# Patient Record
Sex: Male | Born: 1950 | Race: White | Hispanic: No | Marital: Married | Smoking: Never smoker
Health system: Southern US, Community
[De-identification: ages and names within clinical notes are randomized; demographics above are authoritative.]

## PROBLEM LIST (undated history)

## (undated) DIAGNOSIS — F329 Major depressive disorder, single episode, unspecified: Secondary | ICD-10-CM

## (undated) DIAGNOSIS — I10 Essential (primary) hypertension: Secondary | ICD-10-CM

## (undated) DIAGNOSIS — E785 Hyperlipidemia, unspecified: Secondary | ICD-10-CM

## (undated) DIAGNOSIS — K219 Gastro-esophageal reflux disease without esophagitis: Secondary | ICD-10-CM

## (undated) DIAGNOSIS — R7989 Other specified abnormal findings of blood chemistry: Secondary | ICD-10-CM

## (undated) DIAGNOSIS — C801 Malignant (primary) neoplasm, unspecified: Secondary | ICD-10-CM

## (undated) DIAGNOSIS — F32A Depression, unspecified: Secondary | ICD-10-CM

## (undated) HISTORY — PX: COSMETIC SURGERY: SHX468

## (undated) HISTORY — PX: HERNIA REPAIR: SHX51

## (undated) HISTORY — DX: Hyperlipidemia, unspecified: E78.5

## (undated) HISTORY — DX: Major depressive disorder, single episode, unspecified: F32.9

## (undated) HISTORY — DX: Gastro-esophageal reflux disease without esophagitis: K21.9

## (undated) HISTORY — DX: Essential (primary) hypertension: I10

## (undated) HISTORY — DX: Malignant (primary) neoplasm, unspecified: C80.1

## (undated) HISTORY — DX: Depression, unspecified: F32.A

## (undated) HISTORY — DX: Other specified abnormal findings of blood chemistry: R79.89

---

## 2010-12-01 HISTORY — PX: SPINE SURGERY: SHX786

## 2011-10-10 ENCOUNTER — Ambulatory Visit: Payer: Self-pay | Admitting: Surgery

## 2011-10-22 ENCOUNTER — Ambulatory Visit: Payer: Self-pay | Admitting: Unknown Physician Specialty

## 2011-10-30 ENCOUNTER — Ambulatory Visit: Payer: Self-pay | Admitting: Unknown Physician Specialty

## 2012-01-30 LAB — HM COLONOSCOPY: HM Colonoscopy: NORMAL

## 2013-01-27 ENCOUNTER — Encounter: Payer: Self-pay | Admitting: Internal Medicine

## 2013-01-27 ENCOUNTER — Telehealth: Payer: Self-pay | Admitting: Internal Medicine

## 2013-01-27 ENCOUNTER — Ambulatory Visit (INDEPENDENT_AMBULATORY_CARE_PROVIDER_SITE_OTHER): Admitting: Internal Medicine

## 2013-01-27 VITALS — BP 118/80 | HR 74 | Temp 98.2°F | Ht 71.0 in | Wt 235.0 lb

## 2013-01-27 DIAGNOSIS — E785 Hyperlipidemia, unspecified: Secondary | ICD-10-CM

## 2013-01-27 DIAGNOSIS — Z8249 Family history of ischemic heart disease and other diseases of the circulatory system: Secondary | ICD-10-CM

## 2013-01-27 DIAGNOSIS — F329 Major depressive disorder, single episode, unspecified: Secondary | ICD-10-CM

## 2013-01-27 DIAGNOSIS — E291 Testicular hypofunction: Secondary | ICD-10-CM

## 2013-01-27 DIAGNOSIS — K219 Gastro-esophageal reflux disease without esophagitis: Secondary | ICD-10-CM

## 2013-01-27 DIAGNOSIS — N529 Male erectile dysfunction, unspecified: Secondary | ICD-10-CM

## 2013-01-27 DIAGNOSIS — I10 Essential (primary) hypertension: Secondary | ICD-10-CM

## 2013-01-27 DIAGNOSIS — R7989 Other specified abnormal findings of blood chemistry: Secondary | ICD-10-CM

## 2013-01-27 DIAGNOSIS — Z79899 Other long term (current) drug therapy: Secondary | ICD-10-CM

## 2013-01-27 MED ORDER — BUPROPION HCL ER (XL) 150 MG PO TB24: 150.0000 mg | ORAL_TABLET | Freq: Every day | ORAL | Status: DC

## 2013-01-27 MED ORDER — VARDENAFIL HCL 10 MG PO TBDP: 1.0000 | ORAL_TABLET | ORAL | Status: DC | PRN

## 2013-01-27 NOTE — Patient Instructions (Addendum)
You may taper your wellbutrin by taking it every other day for one week, then every 2 days for one week,  Then twice a week for one week,  Then stop

## 2013-01-27 NOTE — Progress Notes (Signed)
Patient ID: Bobby Rich, male   DOB: June 18, 1951, 62 y.o.   MRN: [id number]  Patient Active Problem List  Diagnosis  . GERD (gastroesophageal reflux disease)  . Low testosterone  . Hyperlipidemia  . Depression  . Male erectile dysfunction  . Essential hypertension, benign    Subjective:  CC:   Chief Complaint  Patient presents with  . Establish Care    HPI:   Bobby Rich is a 62 y.o. male who presents as a new patient to establish primary care with the chief complaint of  low testosterone, previously managed with Axiron but the medication is no longer covered by [employer]. He is requesting  New transdermal testosterone replacement but cannot remember the name of it.  He has not had supplementation in over 4 months. He has erectile dysfunction as a result which has been managed with Levitra. However he notes that despite using Levitra he cannot initiate an erectionfor several hours, until it has been in his system for over 6 hours. He has no prior trial of Viagra and is requesting the new dissolvable Levitra tablets.   2) hyperlipidemia. He has a strong family history of coronary artery disease in first-degree relatives. His father had an acute MI at age 72 and his brother at 51. He has undergone risk stratification with stress testing the last one was 2 years ago. He's never had a cardiac catheterization. He is requesting local cardiology followup. he exercises regularly and is asymptomatic. He has been taking atorvastatin with no adverse effects. 3) needs flu shot.  Relocated from [place] 1.5 years ago. 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      Past Medical History  Diagnosis Date  . GERD (gastroesophageal reflux disease)   . Low testosterone   . Hyperlipidemia   . Depression     Past Surgical History  Procedure Laterality Date  . Spine surgery  2012    [place] L3 4 5   . Hernia repair Bilateral     with mesh   . Cosmetic surgery      Family History  Problem Relation Age of  Onset  . Stroke Mother   . Heart disease Father 42    AMI   . Early death Father 25  . Heart disease Brother 57    AMI    History   Social History  . Marital Status: Married    Spouse Name: N/A    Number of Children: N/A  . Years of Education: N/A   Occupational History  . Not on file.   Social History Main Topics  . Smoking status: Never Smoker   . Smokeless tobacco: Never Used  . Alcohol Use: Yes  . Drug Use: No  . Sexually Active: Not on file   Other Topics Concern  . Not on file   Social History Narrative  . No narrative on file   No Known Allergies   Review of Systems:   Patient denies headache, fevers, malaise, unintentional weight loss, skin rash, eye pain, sinus congestion and sinus pain, sore throat, dysphagia,  hemoptysis , cough, dyspnea, wheezing, chest pain, palpitations, orthopnea, edema, abdominal pain, nausea, melena, diarrhea, constipation, flank pain, dysuria, hematuria, urinary  Frequency, nocturia, numbness, tingling, seizures,  Focal weakness, Loss of consciousness,  Tremor, insomnia, depression, anxiety, and suicidal ideation.     Objective:  BP 118/80  Pulse 74  Temp(Src) 98.2 F (36.8 C) (Oral)  Ht 5\' 11"  (1.803 m)  Wt 235 lb (106.595 kg)  BMI 32.79 kg/m2  SpO2  98%  General appearance: alert, cooperative and appears stated age Ears: normal TM's and external ear canals both ears Throat: lips, mucosa, and tongue normal; teeth and gums normal Neck: no adenopathy, no carotid bruit, supple, symmetrical, trachea midline and thyroid not enlarged, symmetric, no tenderness/mass/nodules Back: symmetric, no curvature. ROM normal. No CVA tenderness. Lungs: clear to auscultation bilaterally Heart: regular rate and rhythm, S1, S2 normal, no murmur, click, rub or gallop Abdomen: soft, non-tender; bowel sounds normal; no masses,  no organomegaly Pulses: 2+ and symmetric Skin: Skin color, texture, turgor normal. No rashes or lesions Lymph nodes:  Cervical, supraclavicular, and axillary nodes normal.  Assessment and Plan:  Low testosterone His insurance will cover Fortesta  GELdose pump. We will need a baseline testosterone level prior to initiating therapy.  Hyperlipidemia Strong family history of earlier in early coronary artery disease. Continue atorvastatin. Return for fasting lipids and liver enzyme testing   Male erectile dysfunction His insurance will only cover Levitra if he has previously tried and failed on Viagra. He has not had a trial recently of Viagra. I have sent a prescription for prior to his pharmacy  GERD (gastroesophageal reflux disease) Managed with daily omeprazole.  Depression Managed with daily bupropion 150 mg daily.  Essential hypertension, benign Managed with losartan HCTZ. Blood pressure is well controlled. No changes today. Return for  renal function testing.   Updated Medication List Outpatient Encounter Prescriptions as of 01/27/2013  Medication Sig Dispense Refill  . Alpha-Lipoic Acid 600 MG CAPS Take 1 capsule by mouth daily.      Marland Kitchen aspirin 81 MG tablet Take 81 mg by mouth daily.      Marland Kitchen atorvastatin (LIPITOR) 20 MG tablet Take 20 mg by mouth daily.      Marland Kitchen buPROPion (WELLBUTRIN XL) 150 MG 24 hr tablet Take 1 tablet (150 mg total) by mouth daily.  30 tablet  1  . Cholecalciferol (VITAMIN D-3) 1000 UNITS CAPS Take 1 capsule by mouth daily.      . Chromium 500 MCG TABS Take 1 tablet by mouth daily.      . Coenzyme Q10 (CO Q-10) 100 MG CAPS Take 1 capsule by mouth daily.      Marland Kitchen LACTOBACILLUS PO Take 1 capsule by mouth daily.      Marland Kitchen losartan-hydrochlorothiazide (HYZAAR) 100-12.5 MG per tablet Take 1 tablet by mouth daily.      Marland Kitchen omeprazole (PRILOSEC) 20 MG capsule Take 20 mg by mouth daily.      . Potassium 99 MG TABS Take 1 tablet by mouth daily.      . Selenium 200 MCG CAPS Take 1 capsule by mouth daily.      . Zinc 50 MG CAPS Take 1 capsule by mouth daily.      . [DISCONTINUED] buPROPion  (WELLBUTRIN) 100 MG tablet Take 150 mg by mouth daily.      . [DISCONTINUED] vardenafil (LEVITRA) 20 MG tablet Take 20 mg by mouth daily as needed for erectile dysfunction.      . [DISCONTINUED] Vardenafil HCl (STAXYN) 10 MG TBDP Take 1 tablet by mouth as needed.  10 tablet  1   No facility-administered encounter medications on file as of 01/27/2013.     Orders Placed This Encounter  Procedures  . Comprehensive metabolic panel  . Lipid panel  . TSH  . CBC with Differential  . Testosterone, free, total  . Ambulatory referral to Cardiology  . HM COLONOSCOPY    No Follow-up on file.

## 2013-01-27 NOTE — Telephone Encounter (Signed)
Please call his pharmacy and cancel the vardenafil that i prescribed this morning.

## 2013-01-27 NOTE — Telephone Encounter (Signed)
Bobby Rich needs Tdap and flu vaccines.  Can come back or get them on the day he gets his labs

## 2013-01-28 MED ORDER — SILDENAFIL CITRATE 100 MG PO TABS: 100.0000 mg | ORAL_TABLET | Freq: Every day | ORAL | Status: DC | PRN

## 2013-01-29 ENCOUNTER — Encounter: Payer: Self-pay | Admitting: Internal Medicine

## 2013-01-29 DIAGNOSIS — K219 Gastro-esophageal reflux disease without esophagitis: Secondary | ICD-10-CM | POA: Insufficient documentation

## 2013-01-29 DIAGNOSIS — E785 Hyperlipidemia, unspecified: Secondary | ICD-10-CM | POA: Insufficient documentation

## 2013-01-29 DIAGNOSIS — F324 Major depressive disorder, single episode, in partial remission: Secondary | ICD-10-CM | POA: Insufficient documentation

## 2013-01-29 DIAGNOSIS — I10 Essential (primary) hypertension: Secondary | ICD-10-CM | POA: Insufficient documentation

## 2013-01-29 DIAGNOSIS — N529 Male erectile dysfunction, unspecified: Secondary | ICD-10-CM | POA: Insufficient documentation

## 2013-01-29 DIAGNOSIS — F329 Major depressive disorder, single episode, unspecified: Secondary | ICD-10-CM | POA: Insufficient documentation

## 2013-01-29 DIAGNOSIS — E291 Testicular hypofunction: Secondary | ICD-10-CM | POA: Insufficient documentation

## 2013-01-29 NOTE — Assessment & Plan Note (Signed)
Strong family history of earlier in early coronary artery disease. Continue atorvastatin. Return for fasting lipids and liver enzyme testing

## 2013-01-29 NOTE — Assessment & Plan Note (Signed)
His insurance will only cover Levitra if he has previously tried and failed on Viagra. He has not had a trial recently of Viagra. I have sent a prescription for prior to his pharmacy

## 2013-01-29 NOTE — Assessment & Plan Note (Signed)
Managed with daily omeprazole 

## 2013-01-29 NOTE — Assessment & Plan Note (Signed)
His insurance will cover Fortesta  GELdose pump. We will need a baseline testosterone level prior to initiating therapy.   

## 2013-01-29 NOTE — Assessment & Plan Note (Signed)
Managed with daily bupropion 150 mg daily.

## 2013-01-29 NOTE — Assessment & Plan Note (Signed)
Managed with losartan HCTZ. Blood pressure is well controlled. No changes today. Return for  renal function testing.

## 2013-02-02 ENCOUNTER — Encounter: Payer: Self-pay | Admitting: Cardiovascular Disease

## 2013-02-02 ENCOUNTER — Ambulatory Visit (INDEPENDENT_AMBULATORY_CARE_PROVIDER_SITE_OTHER): Admitting: Cardiovascular Disease

## 2013-02-02 VITALS — BP 138/88 | HR 62 | Ht 71.0 in | Wt 234.0 lb

## 2013-02-02 DIAGNOSIS — E785 Hyperlipidemia, unspecified: Secondary | ICD-10-CM

## 2013-02-02 DIAGNOSIS — Z8249 Family history of ischemic heart disease and other diseases of the circulatory system: Secondary | ICD-10-CM

## 2013-02-02 DIAGNOSIS — I10 Essential (primary) hypertension: Secondary | ICD-10-CM

## 2013-02-02 NOTE — Assessment & Plan Note (Signed)
Well controlled 

## 2013-02-02 NOTE — Patient Instructions (Addendum)
Follow up with Dr. Elease Hashimoto in one year with an EKG. Make sure you have your cholesterol checked.

## 2013-02-02 NOTE — Assessment & Plan Note (Signed)
Pt has a significant family history of CAD ( MI at young age in brother and father).  I think we use an LDL goal of 70 or less.  He's exercising on a regular basis. He's already started losing some weight. He's feeling well and is able to exercise without any complications.  We'll encourage him to eat a low-fat and low-salt diet. I'll see him again in one year. We'll check fasting labs at that time and an EKG. He has fasting labs will be drawn in the next week or so.

## 2013-02-02 NOTE — Progress Notes (Signed)
Bobby Rich Date of Birth  Apr 10, 1951       [facility]    [place] [address]  [address] [place], [place]  [address]   [place], [place]  [address] [phone]     [phone]   Fax  [phone]    Fax [phone]  Problem List: 1. Hyperlipidemia 2. Spinal surgery L3-5 laminectomy 3. Hypertension  History of Present Illness:  Bobby Rich is a 62 yo with a family hx of CAD (father and brother with MI in 12s)  He has a hx of hyperlipidemia.   He works in Consulting civil engineer.  He works out at [employer] 3-5 days / week.  He has been losing weight since joining.  He is limited by back pain.  He has a hx of HTN.  His BP has been well controlled on his current medications.  He eats a good diet - tries to avoid salt and excessive fats.   Current Outpatient Prescriptions on File Prior to Visit  Medication Sig Dispense Refill  . Alpha-Lipoic Acid 600 MG CAPS Take 1 capsule by mouth daily.      Marland Kitchen aspirin 81 MG tablet Take 81 mg by mouth daily.      Marland Kitchen atorvastatin (LIPITOR) 20 MG tablet Take 20 mg by mouth daily.      Marland Kitchen buPROPion (WELLBUTRIN XL) 150 MG 24 hr tablet Take 1 tablet (150 mg total) by mouth daily.  30 tablet  1  . Cholecalciferol (VITAMIN D-3) 1000 UNITS CAPS Take 1 capsule by mouth daily.      . Chromium 500 MCG TABS Take 1 tablet by mouth daily.      . Coenzyme Q10 (CO Q-10) 100 MG CAPS Take 1 capsule by mouth daily.      Marland Kitchen LACTOBACILLUS PO Take 1 capsule by mouth daily.      Marland Kitchen losartan-hydrochlorothiazide (HYZAAR) 100-12.5 MG per tablet Take 1 tablet by mouth daily.      Marland Kitchen omeprazole (PRILOSEC) 20 MG capsule Take 20 mg by mouth daily.      . Potassium 99 MG TABS Take 1 tablet by mouth daily.      . Selenium 200 MCG CAPS Take 1 capsule by mouth daily.      . sildenafil (VIAGRA) 100 MG tablet Take 1 tablet (100 mg total) by mouth daily as needed for erectile dysfunction.  6 tablet  0  . Zinc 50 MG CAPS Take 1 capsule by mouth daily.        No current facility-administered medications on file prior to visit.    No Known Allergies  Past Medical History  Diagnosis Date  . GERD (gastroesophageal reflux disease)   . Low testosterone   . Hyperlipidemia   . Depression   . Hypertension   . Cancer     basel cell skin cancer    Past Surgical History  Procedure Laterality Date  . Spine surgery  2012    [place] L3 4 5   . Hernia repair Bilateral     with mesh   . Cosmetic surgery      History  Smoking status  . Never Smoker   Smokeless tobacco  . Never Used    History  Alcohol Use  . Yes    Comment: social    Family History  Problem Relation Age of Onset  . Stroke Mother   . Heart disease Father 66    AMI   . Early  death Father 43  . Heart disease Brother 5    AMI    Reviw of Systems:  Reviewed in the HPI.  All other systems are negative.  Physical Exam: Blood pressure 138/88, pulse 62, height 5\' 11"  (1.803 m), weight 234 lb (106.142 kg). General: Well developed, well nourished, in no acute distress.  Head: Normocephalic, atraumatic, sclera non-icteric, mucus membranes are moist,   Neck: Supple. Carotids are 2 + without bruits. No JVD   Lungs: Clear   Heart: RR, normal S1, S2, no murmurs  Abdomen: Soft, non-tender, non-distended with normal bowel sounds.  Msk:  Strength and tone are normal   Extremities: No clubbing or cyanosis. No edema.  Distal pedal pulses are 2+ and equal    Neuro: CN II - XII intact.  Alert and oriented X 3.   Psych:  Normal   ECG: February 02, 2013:  NSR at 62, no ST or T wave abn.  Assessment / Plan:

## 2013-02-10 ENCOUNTER — Ambulatory Visit (INDEPENDENT_AMBULATORY_CARE_PROVIDER_SITE_OTHER): Admitting: *Deleted

## 2013-02-10 ENCOUNTER — Other Ambulatory Visit (INDEPENDENT_AMBULATORY_CARE_PROVIDER_SITE_OTHER)

## 2013-02-10 DIAGNOSIS — Z23 Encounter for immunization: Secondary | ICD-10-CM

## 2013-02-10 DIAGNOSIS — E291 Testicular hypofunction: Secondary | ICD-10-CM

## 2013-02-10 DIAGNOSIS — E785 Hyperlipidemia, unspecified: Secondary | ICD-10-CM

## 2013-02-10 DIAGNOSIS — Z79899 Other long term (current) drug therapy: Secondary | ICD-10-CM

## 2013-02-10 LAB — TSH: TSH: 1.95 u[IU]/mL (ref 0.35–5.50)

## 2013-02-10 LAB — CBC WITH DIFFERENTIAL/PLATELET
Basophils Relative: 0.3 % (ref 0.0–3.0)
Eosinophils Relative: 1.6 % (ref 0.0–5.0)
HCT: 41.8 % (ref 39.0–52.0)
Lymphs Abs: 1.7 10*3/uL (ref 0.7–4.0)
Monocytes Relative: 4.9 % (ref 3.0–12.0)
Neutrophils Relative %: 67.5 % (ref 43.0–77.0)
Platelets: 210 10*3/uL (ref 150.0–400.0)
RBC: 4.84 Mil/uL (ref 4.22–5.81)
WBC: 6.7 10*3/uL (ref 4.5–10.5)

## 2013-02-10 LAB — COMPREHENSIVE METABOLIC PANEL
ALT: 34 U/L (ref 0–53)
AST: 24 U/L (ref 0–37)
Calcium: 9.1 mg/dL (ref 8.4–10.5)
Chloride: 103 mEq/L (ref 96–112)
Creatinine, Ser: 1.2 mg/dL (ref 0.4–1.5)
Potassium: 4.3 mEq/L (ref 3.5–5.1)
Sodium: 138 mEq/L (ref 135–145)
Total Protein: 7.3 g/dL (ref 6.0–8.3)

## 2013-02-10 LAB — LIPID PANEL: HDL: 38.3 mg/dL — ABNORMAL LOW (ref >39.00)

## 2013-02-10 LAB — LDL CHOLESTEROL, DIRECT: Direct LDL: 100 mg/dL

## 2013-02-11 ENCOUNTER — Encounter: Payer: Self-pay | Admitting: Internal Medicine

## 2013-02-11 ENCOUNTER — Other Ambulatory Visit: Payer: Self-pay | Admitting: *Deleted

## 2013-02-11 LAB — TESTOSTERONE, FREE, TOTAL, SHBG
Sex Hormone Binding: 30 nmol/L (ref 13–71)
Testosterone-% Free: 2 % (ref 1.6–2.9)
Testosterone: 210 ng/dL — ABNORMAL LOW (ref 300–890)

## 2013-02-11 MED ORDER — TESTOSTERONE 10 MG/ACT (2%) TD GEL: 40.0000 mg | Freq: Every morning | TRANSDERMAL | Status: DC

## 2013-02-11 MED ORDER — SILDENAFIL CITRATE 100 MG PO TABS: 100.0000 mg | ORAL_TABLET | Freq: Every day | ORAL | Status: DC | PRN

## 2013-02-11 NOTE — Addendum Note (Signed)
Addended by: Sherlene Shams on: 02/11/2013 01:26 PM   Modules accepted: Orders

## 2013-02-11 NOTE — Telephone Encounter (Signed)
Please destroy the 30 day rx for fortesta.  90 days rx was printed,  [facility]

## 2013-02-21 ENCOUNTER — Encounter: Payer: Self-pay | Admitting: Internal Medicine

## 2013-02-21 ENCOUNTER — Telehealth: Payer: Self-pay | Admitting: General Practice

## 2013-02-21 MED ORDER — ATORVASTATIN CALCIUM 20 MG PO TABS: 20.0000 mg | ORAL_TABLET | Freq: Every day | ORAL | Status: DC

## 2013-02-21 MED ORDER — LOSARTAN POTASSIUM-HCTZ 100-12.5 MG PO TABS: 1.0000 | ORAL_TABLET | Freq: Every day | ORAL | Status: DC

## 2013-02-21 MED ORDER — BUPROPION HCL ER (XL) 150 MG PO TB24: 150.0000 mg | ORAL_TABLET | Freq: Every day | ORAL | Status: DC

## 2013-02-21 MED ORDER — OMEPRAZOLE 20 MG PO CPDR: 20.0000 mg | DELAYED_RELEASE_CAPSULE | Freq: Every day | ORAL | Status: DC

## 2013-02-21 NOTE — Telephone Encounter (Signed)
meds filled per pt.

## 2013-02-21 NOTE — Telephone Encounter (Signed)
Need printed copy of wellbutrin rx  Faxed  To mail order.

## 2013-02-23 ENCOUNTER — Encounter: Payer: Self-pay | Admitting: Internal Medicine

## 2013-04-06 ENCOUNTER — Encounter: Payer: Self-pay | Admitting: Internal Medicine

## 2013-04-06 DIAGNOSIS — Z1239 Encounter for other screening for malignant neoplasm of breast: Secondary | ICD-10-CM

## 2013-04-13 ENCOUNTER — Encounter: Payer: Self-pay | Admitting: Internal Medicine

## 2013-04-15 NOTE — Telephone Encounter (Signed)
We have not received any thing from pharmacy on this patient no prior signed have ask patient to contact pharmacy to send prior authorization.

## 2013-04-19 ENCOUNTER — Telehealth: Payer: Self-pay | Admitting: Internal Medicine

## 2013-04-19 NOTE — Telephone Encounter (Signed)
[facility] [phone] for prior authorization on the Testosterone 10 mg/ACT (2% gel) form is being faxed over now

## 2013-04-19 NOTE — Telephone Encounter (Signed)
Pt called checking on his prior autho   Testerone replacement therapy

## 2013-04-20 NOTE — Telephone Encounter (Signed)
Received Prior authorization Criteria question, Gave form to [place], [place]'s need to sign red folder

## 2013-04-26 ENCOUNTER — Telehealth: Payer: Self-pay | Admitting: *Deleted

## 2013-04-28 NOTE — Telephone Encounter (Signed)
Patient notified of fax sent for pre- approval for testosterone.

## 2013-04-29 ENCOUNTER — Telehealth: Payer: Self-pay | Admitting: Internal Medicine

## 2013-04-29 ENCOUNTER — Ambulatory Visit: Admitting: Adult Health

## 2013-04-29 NOTE — Telephone Encounter (Signed)
Patient is currently at urgent care being seen verified by phone.

## 2013-04-29 NOTE — Telephone Encounter (Signed)
Caller Name: Veto  Phone: [phone]  Patient: Bobby Rich, Bobby Rich  Gender: Male  DOB: 01/19/1951  Age: 62 Years  PCP: Duncan Dull (Adults only)   Does the office need to follow up with this patient?: No  RN Note:  upper abdomen under rib cage, constant, pain level of 4/10. painful to touch, appt in office cancelled.   Reason For Call & Symptoms: states was told to call to speak with nurse prior to office visit at 1445, abdominal tenderness right side  Reviewed Health History In EMR: Yes  Reviewed Medications In EMR: Yes  Reviewed Allergies In EMR: Yes  Reviewed Surgeries / Procedures: Yes  Date of Onset of Symptoms: 04/27/2013  Guideline(s) Used: Abdominal Pain - Upper  Disposition Per Guideline:   Go to ED Now Reason For Disposition Reached:    Constant abdominal pain lasting > 2 hours  Advice Given:  Call Back If:  You become worse.  Patient Will Follow Care Advice:  YES

## 2013-05-02 ENCOUNTER — Encounter: Payer: Self-pay | Admitting: Adult Health

## 2013-05-02 ENCOUNTER — Ambulatory Visit (INDEPENDENT_AMBULATORY_CARE_PROVIDER_SITE_OTHER): Admitting: Adult Health

## 2013-05-02 VITALS — BP 116/70 | HR 80 | Temp 98.1°F | Resp 12 | Wt 226.0 lb

## 2013-05-02 DIAGNOSIS — R109 Unspecified abdominal pain: Secondary | ICD-10-CM | POA: Insufficient documentation

## 2013-05-02 NOTE — Progress Notes (Signed)
Subjective:    Patient ID: Bobby Rich, male    DOB: Dec 23, 1950, 62 y.o.   MRN: [id number]  HPI  Patient is a pleasant 62 year old male who presents to clinic with complaints of upper abdominal pain. He reports the pain is mainly on the right upper quadrant but it is also in the epigastric area and left upper quadrant. The pain started Wednesday. Feels like "pain after doing sit ups" or a "very bad sunburn". Painful superficial pain. Constant. The only thing out of the usual that he has done has been recently installing bamboo flooring which contained formaldehyde. He had loose bowels associated with this for a couple of days but this has subsided. Patient was seen at [facility] on Friday and had an extensive blood work up and EKG - all normal. Pain is worse today than ever. Worse with movement. He does not notice pain associated with food (lack of or after meals). He has not taken anything for his pain. Pain improved with being still. He reports having a restless night. Pain with rolling over in bed. He is mildly nauseated. Denies fever or chills. Denies blood in his urine or stool. He does not believe this is a muscular type pain. Patient has not started any new medications or supplements.   Current Outpatient Prescriptions on File Prior to Visit  Medication Sig Dispense Refill  . Alpha-Lipoic Acid 600 MG CAPS Take 1 capsule by mouth daily.      Marland Kitchen aspirin 81 MG tablet Take 81 mg by mouth daily.      Marland Kitchen atorvastatin (LIPITOR) 20 MG tablet Take 1 tablet (20 mg total) by mouth daily.  90 tablet  1  . buPROPion (WELLBUTRIN XL) 150 MG 24 hr tablet Take 1 tablet (150 mg total) by mouth daily.  90 tablet  2  . Cholecalciferol (VITAMIN D-3) 1000 UNITS CAPS Take 1 capsule by mouth daily.      . Chromium 500 MCG TABS Take 1 tablet by mouth daily.      . Coenzyme Q10 (CO Q-10) 100 MG CAPS Take 1 capsule by mouth daily.      Marland Kitchen LACTOBACILLUS PO Take 1 capsule by mouth daily.      Marland Kitchen  losartan-hydrochlorothiazide (HYZAAR) 100-12.5 MG per tablet Take 1 tablet by mouth daily.  90 tablet  1  . omeprazole (PRILOSEC) 20 MG capsule Take 1 capsule (20 mg total) by mouth daily.  90 capsule  1  . Potassium 99 MG TABS Take 1 tablet by mouth daily.      . Selenium 200 MCG CAPS Take 1 capsule by mouth daily.      . sildenafil (VIAGRA) 100 MG tablet Take 1 tablet (100 mg total) by mouth daily as needed for erectile dysfunction.  18 tablet  3  . Testosterone 10 MG/ACT (2%) GEL Place 40 mg onto the skin every morning. 4 pumps daily. Apply to both thighs  180 g  3  . Zinc 50 MG CAPS Take 1 capsule by mouth daily.       No current facility-administered medications on file prior to visit.      Review of Systems  Constitutional: Negative for fever, chills and fatigue.  Respiratory: Negative.   Cardiovascular: Negative.   Gastrointestinal: Positive for nausea and abdominal pain. Negative for vomiting, diarrhea, constipation and blood in stool.  Genitourinary: Negative for dysuria, hematuria and flank pain.  Neurological: Negative.   Psychiatric/Behavioral: Negative.     BP 116/70  Pulse 80  Temp(Src)  98.1 F (36.7 C) (Oral)  Resp 12  Wt 226 lb (102.513 kg)  BMI 31.53 kg/m2  SpO2 98%    Objective:   Physical Exam  Constitutional: He is oriented to person, place, and time.  Overweight, cooperative gentleman in no apparent distress. Patient appears uncomfortable  Cardiovascular: Normal rate and regular rhythm.   Pulmonary/Chest: Effort normal. No respiratory distress.  Abdominal: Soft. He exhibits no mass. There is tenderness. There is no guarding.  Pain with palpating the right upper quadrant, epigastric area and left upper quadrant  Neurological: He is alert and oriented to person, place, and time.  Skin: Skin is warm and dry. No rash noted. No erythema.  Psychiatric: He has a normal mood and affect. His behavior is normal. Judgment and thought content normal.           Assessment & Plan:

## 2013-05-02 NOTE — Patient Instructions (Addendum)
  I am sending you for an ultrasound of the abdomen.  Once I obtain the results I will notify you.  We'll consider CT of the abdomen if the ultrasound does not explain your reason for pain.

## 2013-05-02 NOTE — Assessment & Plan Note (Signed)
Recent workup at [facility] with labs being completely normal. Also had EKG which was normal. Send patient for complete ultrasound. Consider CT if ultrasound does not give definite reason for his pain.

## 2013-05-04 ENCOUNTER — Other Ambulatory Visit: Payer: Self-pay | Admitting: Adult Health

## 2013-05-04 ENCOUNTER — Encounter: Payer: Self-pay | Admitting: Adult Health

## 2013-05-04 DIAGNOSIS — R109 Unspecified abdominal pain: Secondary | ICD-10-CM

## 2013-05-04 NOTE — Telephone Encounter (Signed)
Patient notified of approval. 

## 2013-05-05 ENCOUNTER — Ambulatory Visit (INDEPENDENT_AMBULATORY_CARE_PROVIDER_SITE_OTHER): Admitting: Internal Medicine

## 2013-05-05 ENCOUNTER — Encounter: Payer: Self-pay | Admitting: Internal Medicine

## 2013-05-05 ENCOUNTER — Ambulatory Visit: Payer: Self-pay | Admitting: Adult Health

## 2013-05-05 VITALS — BP 110/72 | HR 79 | Temp 98.4°F | Resp 16 | Wt 226.8 lb

## 2013-05-05 DIAGNOSIS — R109 Unspecified abdominal pain: Secondary | ICD-10-CM

## 2013-05-05 DIAGNOSIS — B029 Zoster without complications: Secondary | ICD-10-CM

## 2013-05-05 MED ORDER — HYDROCODONE-ACETAMINOPHEN 10-325 MG PO TABS: 1.0000 | ORAL_TABLET | Freq: Four times a day (QID) | ORAL | Status: DC | PRN

## 2013-05-05 MED ORDER — GABAPENTIN 300 MG PO CAPS: 300.0000 mg | ORAL_CAPSULE | Freq: Three times a day (TID) | ORAL | Status: DC

## 2013-05-05 NOTE — Progress Notes (Addendum)
Patient ID: Bobby Rich, male   DOB: [birth year], 62 y.o.   MRN: [id number]  Patient Active Problem List   Diagnosis Date Noted  . Shingles 05/08/2013  . Abdominal  pain, other specified site 05/02/2013  . Male erectile dysfunction 01/29/2013  . Essential hypertension, benign 01/29/2013  . GERD (gastroesophageal reflux disease)   . Low testosterone   . Hyperlipidemia   . Depression     Subjective:  CC:   Chief Complaint  Patient presents with  . Follow-up    from visit one week ago, now has rash.    HPI:   Noel Rodier a 62 y.o. male who presents for follow up on right sided abdominal pain of uncertain etiology.  Seen by NP one week ago and labs normal.  CT abdomen and pelvis scheduled but patient developed blistering rash  On abdominal wall so he came in to confirm suspicion of shingles as source of pain and wants to hold off on the CT .   Past Medical History  Diagnosis Date  . GERD (gastroesophageal reflux disease)   . Low testosterone   . Hyperlipidemia   . Depression   . Hypertension   . Cancer     basel cell skin cancer    Past Surgical History  Procedure Laterality Date  . Spine surgery  2012    [place] L3 4 5   . Hernia repair Bilateral     with mesh   . Cosmetic surgery         The following portions of the patient's history were reviewed and updated as appropriate: Allergies, current medications, and problem list.    Review of Systems:   12 Pt  review of systems was negative except those addressed in the HPI,     History   Social History  . Marital Status: Married    Spouse Name: N/A    Number of Children: N/A  . Years of Education: N/A   Occupational History  . Not on file.   Social History Main Topics  . Smoking status: Never Smoker   . Smokeless tobacco: Never Used  . Alcohol Use: Yes     Comment: social  . Drug Use: No  . Sexually Active: Not on file   Other Topics Concern  . Not on file   Social History Narrative   . No narrative on file    Objective:  BP 110/72  Pulse 79  Temp(Src) 98.4 F (36.9 C) (Oral)  Resp 16  Wt 226 lb 12 oz (102.853 kg)  BMI 31.64 kg/m2  SpO2 97%  General appearance: alert, cooperative and appears stated age Ears: normal TM's and external ear canals both ears Throat: lips, mucosa, and tongue normal; teeth and gums normal Neck: no adenopathy, no carotid bruit, supple, symmetrical, trachea midline and thyroid not enlarged, symmetric, no tenderness/mass/nodules Back: symmetric, no curvature. ROM normal. No CVA tenderness. Lungs: clear to auscultation bilaterally Heart: regular rate and rhythm, S1, S2 normal, no murmur, click, rub or gallop Abdomen: soft, non-tender; bowel sounds normal; no masses,  no organomegaly Pulses: 2+ and symmetric Skin: right abdominal wall vesicular red rash  Lymph nodes: Cervical, supraclavicular, and axillary nodes normal.  Assessment and Plan:  Abdominal  pain, other specified site secondareto shingles. CT cancelled.   Shingles Acyclovir, gabapentin,  And pain relievers prescribed   A total of 25 minutes was spent with patient more than half of which was spent in counseling, reviewing records from other prviders and answering scores  of questions from his wife     List Outpatient Encounter Prescriptions as of 05/05/2013  Medication Sig Dispense Refill  . Alpha-Lipoic Acid 600 MG CAPS Take 1 capsule by mouth daily.      Marland Kitchen aspirin 81 MG tablet Take 81 mg by mouth daily.      Marland Kitchen atorvastatin (LIPITOR) 20 MG tablet Take 1 tablet (20 mg total) by mouth daily.  90 tablet  1  . buPROPion (WELLBUTRIN XL) 150 MG 24 hr tablet Take 1 tablet (150 mg total) by mouth daily.  90 tablet  2  . Cholecalciferol (VITAMIN D-3) 1000 UNITS CAPS Take 1 capsule by mouth daily.      . Chromium 500 MCG TABS Take 1 tablet by mouth daily.      . Coenzyme Q10 (CO Q-10) 100 MG CAPS Take 1 capsule by mouth daily.      Marland Kitchen LACTOBACILLUS PO Take 1 capsule by mouth  daily.      Marland Kitchen losartan-hydrochlorothiazide (HYZAAR) 100-12.5 MG per tablet Take 1 tablet by mouth daily.  90 tablet  1  . omeprazole (PRILOSEC) 20 MG capsule Take 1 capsule (20 mg total) by mouth daily.  90 capsule  1  . Potassium 99 MG TABS Take 1 tablet by mouth daily.      . Selenium 200 MCG CAPS Take 1 capsule by mouth daily.      . sildenafil (VIAGRA) 100 MG tablet Take 1 tablet (100 mg total) by mouth daily as needed for erectile dysfunction.  18 tablet  3  . Zinc 50 MG CAPS Take 1 capsule by mouth daily.      . [DISCONTINUED] Testosterone 10 MG/ACT (2%) GEL Place 40 mg onto the skin every morning. 4 pumps daily. Apply to both thighs  180 g  3  . gabapentin (NEURONTIN) 300 MG capsule Take 1 capsule (300 mg total) by mouth 3 (three) times daily.  90 capsule  3  . HYDROcodone-acetaminophen (NORCO) 10-325 MG per tablet Take 1 tablet by mouth every 6 (six) hours as needed for pain.  90 tablet  0  . [DISCONTINUED] HYDROcodone-acetaminophen (NORCO) 10-325 MG per tablet Take 1 tablet by mouth every 6 (six) hours as needed for pain.  90 tablet  0   No facility-administered encounter medications on file as of 05/05/2013.     No orders of the defined types were placed in this encounter.    No Follow-up on file.

## 2013-05-05 NOTE — Patient Instructions (Addendum)
You have shingles!! \  For shingles pain:  Take Gabapentin every 8 hours  continue aleve  Add vicodin if needed   dulcolax or ex lax or sennakot for narcotic induced constipation  Continue acyclovir 800 mg five times daily   continue RTPR since it seems to be helping .    Avoid other creams   Practice contact precautions with pregnant women and those who have not had chicken pox

## 2013-05-06 ENCOUNTER — Other Ambulatory Visit: Payer: Self-pay | Admitting: Adult Health

## 2013-05-06 MED ORDER — FAMCICLOVIR 500 MG PO TABS: 500.0000 mg | ORAL_TABLET | Freq: Three times a day (TID) | ORAL | Status: DC

## 2013-05-08 DIAGNOSIS — B029 Zoster without complications: Secondary | ICD-10-CM | POA: Insufficient documentation

## 2013-05-08 NOTE — Assessment & Plan Note (Signed)
secondareto shingles. CT cancelled.

## 2013-05-08 NOTE — Assessment & Plan Note (Signed)
Acyclovir, gabapentin,  And pain relievers prescribed

## 2013-05-19 ENCOUNTER — Encounter: Payer: Self-pay | Admitting: Internal Medicine

## 2013-05-23 ENCOUNTER — Encounter: Admitting: Internal Medicine

## 2013-05-26 ENCOUNTER — Ambulatory Visit (INDEPENDENT_AMBULATORY_CARE_PROVIDER_SITE_OTHER): Admitting: Internal Medicine

## 2013-05-26 ENCOUNTER — Encounter: Payer: Self-pay | Admitting: Internal Medicine

## 2013-05-26 VITALS — BP 118/78 | HR 78 | Temp 97.8°F | Resp 16 | Ht 71.0 in | Wt 231.5 lb

## 2013-05-26 DIAGNOSIS — Z0000 Encounter for general adult medical examination without abnormal findings: Secondary | ICD-10-CM

## 2013-05-26 DIAGNOSIS — I10 Essential (primary) hypertension: Secondary | ICD-10-CM

## 2013-05-26 DIAGNOSIS — M545 Low back pain, unspecified: Secondary | ICD-10-CM

## 2013-05-26 DIAGNOSIS — E785 Hyperlipidemia, unspecified: Secondary | ICD-10-CM

## 2013-05-26 DIAGNOSIS — Z125 Encounter for screening for malignant neoplasm of prostate: Secondary | ICD-10-CM

## 2013-05-26 DIAGNOSIS — R7989 Other specified abnormal findings of blood chemistry: Secondary | ICD-10-CM

## 2013-05-26 DIAGNOSIS — B0229 Other postherpetic nervous system involvement: Secondary | ICD-10-CM

## 2013-05-26 DIAGNOSIS — E291 Testicular hypofunction: Secondary | ICD-10-CM

## 2013-05-26 DIAGNOSIS — Z79899 Other long term (current) drug therapy: Secondary | ICD-10-CM

## 2013-05-26 MED ORDER — DICLOFENAC EPOLAMINE 1.3 % TD PTCH: 1.0000 | MEDICATED_PATCH | Freq: Two times a day (BID) | TRANSDERMAL | Status: DC

## 2013-05-26 NOTE — Patient Instructions (Addendum)
You had your annual exam today and your prostate exam was normal.   We will schedule your  PSA and testosterone level in 6 weeks   You are up to date on your  TDaP vaccine and can get the Shingles vaccineand repeat fasting lipids at your next OV  in December .   We will contact you with the bloodwork results  I recommend weight loss of 23 lbs (10% or current weight) and a low glycemic index diet to lower your triglycerides  PT referral in process  Trial of flector patches (or alternative diclofenac dorm of NSAID therapy if too $$$) for your back pain   You should have an annual eye exam.  Consider [facility] or the eye center next to [facility] [employer]

## 2013-05-26 NOTE — Progress Notes (Signed)
Patient ID: Bobby Rich, male   DOB: [birth year], 62 y.o.   MRN: [id number]  The patient is here for his annual male physical examination and management of other chronic and acute problems.  1) His recent episode of shingles is resolving,  Pain much improved.   No side effects from the gabapentin .  Wants to continue it at night. And once daily.   2) Back pain:  Continues to have morning lower back stiffness aggravated  By doing  yardwork. Wants PT   3) History of skin cancer.  Sees dermatology once a year.   [facility]   4) Last eye exam was over one year,  [place] in [place]   The risk factors are reflected in the social history. Home safety : The patient has smoke detectors in the home. He wears seatbelts.  There are no firearms at home. There is no violence in the home.  There is no risks for hepatitis, STDs or HIV. There is no   history of blood transfusion. There is no travel history to infectious disease endemic areas of the world.  The patient has seen their dentist in the last six month and  their eye doctor in the last year.  They do not  have excessive sun exposure. They have seen a dermatoloigist in the last year. Discussed the need for sun protection: hats, long sleeves and use of sunscreen if there is significant sun exposure.   Diet: the importance of a healthy diet is discussed. They do have a healthy diet.  The benefits of regular aerobic exercise were discussed. He exercises a minimum of 30 minutes  5 days per week. Depression screen: there are no signs or vegative symptoms of depression- irritability, change in appetite, anhedonia, sadness/tearfullness.  The following portions of the patient's history were reviewed and updated as appropriate: allergies, current medications, past family history, past medical history,  past surgical history, past social history  and problem list.  Visual acuity was not assessed per patient preference since he has regular follow  up with his ophthalmologist. Hearing and body mass index were assessed and reviewed.   During the course of the visit the patient was educated and counseled about appropriate screening and preventive services including :  nutrition counseling, colorectal cancer screening, and recommended immunizations.    Objective  BP 118/78  Pulse 78  Temp(Src) 97.8 F (36.6 C) (Oral)  Resp 16  Ht 5\' 11"  (1.803 m)  Wt 231 lb 8 oz (105.008 kg)  BMI 32.3 kg/m2  SpO2 97%  General Appearance:    Alert, cooperative, no distress, appears stated age  Head:    Normocephalic, without obvious abnormality, atraumatic  Eyes:    PERRL, conjunctiva/corneas clear, EOM's intact, fundi    benign, both eyes       Ears:    Normal TM's and external ear canals, both ears  Nose:   Nares normal, septum midline, mucosa normal, no drainage   or sinus tenderness  Throat:   Lips, mucosa, and tongue normal; teeth and gums normal  Neck:   Supple, symmetrical, trachea midline, no adenopathy;       thyroid:  No enlargement/tenderness/nodules; no carotid   bruit or JVD  Back:     Symmetric, no curvature, ROM normal, no CVA tenderness  Lungs:     Clear to auscultation bilaterally, respirations unlabored  Chest wall:    No tenderness or deformity  Heart:    Regular rate and rhythm, S1 and  S2 normal, no murmur, rub   or gallop  Abdomen:     Soft, non-tender, bowel sounds active all four quadrants,    no masses, no organomegaly  Genitalia:    Normal male without lesion, discharge or tenderness  Rectal:    Normal tone, normal prostate, no masses or tenderness;   guaiac negative stool  Extremities:   Extremities normal, atraumatic, no cyanosis or edema  Pulses:   2+ and symmetric all extremities  Skin:   Skin color, texture, turgor normal, no rashes or lesions  Lymph nodes:   Cervical, supraclavicular, and axillary nodes normal  Neurologic:   CNII-XII intact. Normal strength, sensation and reflexes      throughout     Assessment and Plan:   Routine general medical examination at a health care facility Annual male exam was done including testicular and prostate exam. PSA is pending .  Colon ca screening is up to date  HZV (herpes zoster virus) post herpetic neuralgia From initial shingles involving LUQ.  Pain resolving,  continue gabapentin bid. Patient advised to wait 6 months for zostavax   Low back pain without sciatica Nonradiating, normal DTRS and distal strength.  Trial of diclofenac gel and PT evaluation  Hyperlipidemia .I have addressed  BMI and recommended a low glycemic index diet and weight loss of 105 of body weight to manage hypertriglyceridemia.   I have also recommended that patient start exercising with a goal of 30 minutes of aerobic exercise a minimum of 5 days per week.   Essential hypertension, benign Well controlled on current regimen. Renal function stable, no changes today.   Updated Medication List Outpatient Encounter Prescriptions as of 05/26/2013  Medication Sig Dispense Refill  . Alpha-Lipoic Acid 600 MG CAPS Take 1 capsule by mouth daily.      Marland Kitchen aspirin 81 MG tablet Take 81 mg by mouth daily.      Marland Kitchen atorvastatin (LIPITOR) 20 MG tablet Take 1 tablet (20 mg total) by mouth daily.  90 tablet  1  . buPROPion (WELLBUTRIN XL) 150 MG 24 hr tablet Take 1 tablet (150 mg total) by mouth daily.  90 tablet  2  . Cholecalciferol (VITAMIN D-3) 1000 UNITS CAPS Take 1 capsule by mouth daily.      . Chromium 500 MCG TABS Take 1 tablet by mouth daily.      . Coenzyme Q10 (CO Q-10) 100 MG CAPS Take 1 capsule by mouth daily.      Marland Kitchen gabapentin (NEURONTIN) 300 MG capsule Take 1 capsule (300 mg total) by mouth 3 (three) times daily.  90 capsule  3  . LACTOBACILLUS PO Take 1 capsule by mouth daily.      Marland Kitchen losartan-hydrochlorothiazide (HYZAAR) 100-12.5 MG per tablet Take 1 tablet by mouth daily.  90 tablet  1  . omeprazole (PRILOSEC) 20 MG capsule Take 1 capsule (20 mg total) by mouth  daily.  90 capsule  1  . Potassium 99 MG TABS Take 1 tablet by mouth daily.      . Selenium 200 MCG CAPS Take 1 capsule by mouth daily.      . sildenafil (VIAGRA) 100 MG tablet Take 1 tablet (100 mg total) by mouth daily as needed for erectile dysfunction.  18 tablet  3  . Testosterone (FORTESTA) 10 MG/ACT (2%) GEL Place 40 mg onto the skin daily.      . Zinc 50 MG CAPS Take 1 capsule by mouth daily.      . [DISCONTINUED] testosterone (  ANDROGEL) 50 MG/5GM GEL Place 5 g onto the skin daily.      . diclofenac (FLECTOR) 1.3 % PTCH Place 1 patch onto the skin 2 (two) times daily.  30 patch  0  . famciclovir (FAMVIR) 500 MG tablet Take 1 tablet (500 mg total) by mouth 3 (three) times daily.  21 tablet  0  . HYDROcodone-acetaminophen (NORCO) 10-325 MG per tablet Take 1 tablet by mouth every 6 (six) hours as needed for pain.  90 tablet  0   No facility-administered encounter medications on file as of 05/26/2013.

## 2013-05-26 NOTE — Assessment & Plan Note (Addendum)
No noticeable difference with the gel  , for the past 3 weeks.  We will recheck testosterone level in 2 months and increased dose if low

## 2013-05-27 ENCOUNTER — Encounter: Payer: Self-pay | Admitting: Internal Medicine

## 2013-05-27 DIAGNOSIS — B0229 Other postherpetic nervous system involvement: Secondary | ICD-10-CM | POA: Insufficient documentation

## 2013-05-27 DIAGNOSIS — M545 Low back pain: Secondary | ICD-10-CM | POA: Insufficient documentation

## 2013-05-27 MED ORDER — DICLOFENAC SODIUM 1 % TD GEL: 2.0000 g | Freq: Four times a day (QID) | TRANSDERMAL | Status: DC

## 2013-05-27 NOTE — Assessment & Plan Note (Signed)
From initial shingles involving LUQ.  Pain resolving,  continue gabapentin bid. Patient advised to wait 6 months for zostavax

## 2013-05-27 NOTE — Assessment & Plan Note (Signed)
Well controlled on current regimen. Renal function stable, no changes today. 

## 2013-05-27 NOTE — Assessment & Plan Note (Signed)
.  I have addressed  BMI and recommended a low glycemic index diet and weight loss of 105 of body weight to manage hypertriglyceridemia.   I have also recommended that patient start exercising with a goal of 30 minutes of aerobic exercise a minimum of 5 days per week.    

## 2013-05-27 NOTE — Assessment & Plan Note (Addendum)
Annual male exam was done including testicular and prostate exam. PSA is pending .  Colon ca screening is up to date

## 2013-05-27 NOTE — Assessment & Plan Note (Signed)
Nonradiating, normal DTRS and distal strength.  Trial of diclofenac gel and PT evaluation

## 2013-06-18 ENCOUNTER — Encounter: Payer: Self-pay | Admitting: Internal Medicine

## 2013-06-20 MED ORDER — CELECOXIB 200 MG PO CAPS: 200.0000 mg | ORAL_CAPSULE | Freq: Two times a day (BID) | ORAL | Status: DC

## 2013-06-20 NOTE — Telephone Encounter (Signed)
Glad it's helping rx sent.  1 refill per office protocol. Youll need to stop the diclofenac patch and gel.   TT

## 2013-06-20 NOTE — Addendum Note (Signed)
Addended by: Sherlene Shams on: 06/20/2013 12:54 PM   Modules accepted: Orders, Medications

## 2013-07-05 ENCOUNTER — Encounter: Payer: Self-pay | Admitting: Internal Medicine

## 2013-08-08 ENCOUNTER — Other Ambulatory Visit: Payer: Self-pay | Admitting: Internal Medicine

## 2013-09-23 ENCOUNTER — Encounter: Payer: Self-pay | Admitting: Internal Medicine

## 2013-10-05 ENCOUNTER — Telehealth: Payer: Self-pay | Admitting: Internal Medicine

## 2013-10-05 NOTE — Telephone Encounter (Signed)
Pt coming Friday a.m. For blood work and flu shot.  Also asking if he can get shingles vaccine at that time.  Please advise.

## 2013-10-06 ENCOUNTER — Other Ambulatory Visit: Payer: Self-pay

## 2013-10-06 NOTE — Telephone Encounter (Signed)
As long as he has a history of chicken pox, he can have the shingles vaccine

## 2013-10-07 ENCOUNTER — Other Ambulatory Visit (INDEPENDENT_AMBULATORY_CARE_PROVIDER_SITE_OTHER)

## 2013-10-07 ENCOUNTER — Ambulatory Visit (INDEPENDENT_AMBULATORY_CARE_PROVIDER_SITE_OTHER): Admitting: *Deleted

## 2013-10-07 DIAGNOSIS — Z23 Encounter for immunization: Secondary | ICD-10-CM

## 2013-10-07 DIAGNOSIS — Z79899 Other long term (current) drug therapy: Secondary | ICD-10-CM

## 2013-10-07 DIAGNOSIS — Z125 Encounter for screening for malignant neoplasm of prostate: Secondary | ICD-10-CM

## 2013-10-07 LAB — CBC WITH DIFFERENTIAL/PLATELET
Basophils Relative: 0.4 % (ref 0.0–3.0)
Eosinophils Absolute: 0.1 10*3/uL (ref 0.0–0.7)
Lymphocytes Relative: 32.4 % (ref 12.0–46.0)
MCHC: 33.9 g/dL (ref 30.0–36.0)
MCV: 83.9 fl (ref 78.0–100.0)
Monocytes Absolute: 0.4 10*3/uL (ref 0.1–1.0)
Neutrophils Relative %: 58.8 % (ref 43.0–77.0)
Platelets: 198 10*3/uL (ref 150.0–400.0)
RBC: 5.17 Mil/uL (ref 4.22–5.81)
WBC: 5.9 10*3/uL (ref 4.5–10.5)

## 2013-10-07 LAB — COMPREHENSIVE METABOLIC PANEL
AST: 23 U/L (ref 0–37)
Albumin: 4.1 g/dL (ref 3.5–5.2)
BUN: 17 mg/dL (ref 6–23)
Calcium: 9.7 mg/dL (ref 8.4–10.5)
Chloride: 103 mEq/L (ref 96–112)
Glucose, Bld: 91 mg/dL (ref 70–99)
Potassium: 4.6 mEq/L (ref 3.5–5.1)
Sodium: 138 mEq/L (ref 135–145)
Total Protein: 6.9 g/dL (ref 6.0–8.3)

## 2013-10-07 NOTE — Telephone Encounter (Signed)
Patient stated he has had shingles and chicken pox? Patient stated he has a history of shingles please advise as to shingles vaccine? Dr. Darrick Huntsman advised since patient had shingles in June waiting a few more months.

## 2013-10-09 ENCOUNTER — Encounter: Payer: Self-pay | Admitting: Internal Medicine

## 2013-10-10 ENCOUNTER — Other Ambulatory Visit: Payer: Self-pay | Admitting: Internal Medicine

## 2013-10-10 ENCOUNTER — Encounter: Payer: Self-pay | Admitting: Internal Medicine

## 2013-10-10 DIAGNOSIS — R7989 Other specified abnormal findings of blood chemistry: Secondary | ICD-10-CM

## 2013-10-10 MED ORDER — TESTOSTERONE 10 MG/ACT (2%) TD GEL: 60.0000 mg | Freq: Every day | TRANSDERMAL | Status: DC

## 2013-10-10 NOTE — Assessment & Plan Note (Signed)
INCREASING DOSE OF FORTESTA 2% TO 60 MG DAILY for low level

## 2013-10-11 ENCOUNTER — Encounter: Payer: Self-pay | Admitting: Internal Medicine

## 2013-10-25 ENCOUNTER — Encounter: Payer: Self-pay | Admitting: Internal Medicine

## 2013-11-02 ENCOUNTER — Encounter: Payer: Self-pay | Admitting: Adult Health

## 2013-11-02 ENCOUNTER — Ambulatory Visit (INDEPENDENT_AMBULATORY_CARE_PROVIDER_SITE_OTHER): Admitting: Adult Health

## 2013-11-02 VITALS — BP 124/72 | HR 78 | Temp 97.8°F | Resp 14 | Wt 230.0 lb

## 2013-11-02 DIAGNOSIS — M79643 Pain in unspecified hand: Secondary | ICD-10-CM

## 2013-11-02 DIAGNOSIS — M25549 Pain in joints of unspecified hand: Secondary | ICD-10-CM

## 2013-11-02 LAB — CBC WITH DIFFERENTIAL/PLATELET
Basophils Absolute: 0 10*3/uL (ref 0.0–0.1)
Hemoglobin: 15.3 g/dL (ref 13.0–17.0)
Lymphocytes Relative: 29.4 % (ref 12.0–46.0)
MCV: 84.6 fl (ref 78.0–100.0)
Monocytes Relative: 6.2 % (ref 3.0–12.0)
Neutrophils Relative %: 61.4 % (ref 43.0–77.0)
Platelets: 242 10*3/uL (ref 150.0–400.0)
RDW: 13.7 % (ref 11.5–14.6)

## 2013-11-02 LAB — C-REACTIVE PROTEIN: CRP: 0.6 mg/dL (ref 0.5–20.0)

## 2013-11-02 LAB — SEDIMENTATION RATE: Sed Rate: 9 mm/hr (ref 0–22)

## 2013-11-02 NOTE — Progress Notes (Signed)
Subjective:    Patient ID: Bobby Rich, male    DOB: [birth year], 62 y.o.   MRN: [id number]  HPI  Mr. Ragsdale is a pleasant 62 y/o male who presents to clinic with c/o bilateral hand, joint pain that has been ongoing for several months. He works in [employer] and is concerned about losing the ability to use his hands. He reports strong family hx of arthritis; however, he is uncertain which type. He denies recent illness or other problems.   Current Outpatient Prescriptions on File Prior to Visit  Medication Sig Dispense Refill  . Alpha-Lipoic Acid 600 MG CAPS Take 1 capsule by mouth daily.      Marland Kitchen aspirin 81 MG tablet Take 81 mg by mouth daily.      Marland Kitchen atorvastatin (LIPITOR) 20 MG tablet TAKE 1 TABLET DAILY  90 tablet  2  . buPROPion (WELLBUTRIN XL) 150 MG 24 hr tablet Take 1 tablet (150 mg total) by mouth daily.  90 tablet  2  . celecoxib (CELEBREX) 200 MG capsule Take 1 capsule (200 mg total) by mouth 2 (two) times daily.  180 capsule  1  . Cholecalciferol (VITAMIN D-3) 1000 UNITS CAPS Take 1 capsule by mouth daily.      . Chromium 500 MCG TABS Take 1 tablet by mouth daily.      . Coenzyme Q10 (CO Q-10) 100 MG CAPS Take 1 capsule by mouth daily.      Marland Kitchen gabapentin (NEURONTIN) 300 MG capsule Take 1 capsule (300 mg total) by mouth 3 (three) times daily.  90 capsule  3  . HYDROcodone-acetaminophen (NORCO) 10-325 MG per tablet Take 1 tablet by mouth every 6 (six) hours as needed for pain.  90 tablet  0  . LACTOBACILLUS PO Take 1 capsule by mouth daily.      Marland Kitchen losartan-hydrochlorothiazide (HYZAAR) 100-12.5 MG per tablet Take 1 tablet by mouth daily.  90 tablet  1  . omeprazole (PRILOSEC) 20 MG capsule TAKE 1 CAPSULE (20 MG TOTAL) DAILY  90 capsule  2  . Potassium 99 MG TABS Take 1 tablet by mouth daily.      . Selenium 200 MCG CAPS Take 1 capsule by mouth daily.      . sildenafil (VIAGRA) 100 MG tablet Take 1 tablet (100 mg total) by mouth daily as needed for erectile dysfunction.   18 tablet  3  . Testosterone (FORTESTA) 10 MG/ACT (2%) GEL Place 60 mg onto the skin daily.  18 g  4  . Zinc 50 MG CAPS Take 1 capsule by mouth daily.      . famciclovir (FAMVIR) 500 MG tablet Take 1 tablet (500 mg total) by mouth 3 (three) times daily.  21 tablet  0   No current facility-administered medications on file prior to visit.     Review of Systems  Musculoskeletal: Positive for joint swelling.       Bilateral hand pain  Neurological: Negative for numbness.       Objective:   Physical Exam  Constitutional: He is oriented to person, place, and time.  Musculoskeletal: Normal range of motion. He exhibits edema and tenderness.  Bilateral hands with mild inflammation noted at bases of thumb. Mild inflammation also noted PIP joints. Did not notice any ulnar deviation of fingers.  Neurological: He is alert and oriented to person, place, and time.  Psychiatric: He has a normal mood and affect. His behavior is normal. Judgment and thought content normal.  Assessment & Plan:

## 2013-11-02 NOTE — Assessment & Plan Note (Addendum)
Ongoing bilateral pain for several months. Noted swelling of PIP joints. No ulnar deviation of fingers. Check cbc, crp, sed rate, Rheumatoid factor, ANA, Refer to Rheumatology

## 2013-11-02 NOTE — Progress Notes (Signed)
Pre visit review using our clinic review tool, if applicable. No additional management support is needed unless otherwise documented below in the visit note. 

## 2013-11-03 LAB — ANA: Anti Nuclear Antibody(ANA): NEGATIVE

## 2013-11-03 LAB — RHEUMATOID FACTOR: Rhuematoid fact SerPl-aCnc: <10 IU/mL (ref <=14)

## 2013-11-04 ENCOUNTER — Encounter: Payer: Self-pay | Admitting: Adult Health

## 2013-11-10 ENCOUNTER — Ambulatory Visit: Admitting: Internal Medicine

## 2013-12-20 ENCOUNTER — Encounter: Payer: Self-pay | Admitting: Rheumatology

## 2014-01-01 ENCOUNTER — Encounter: Payer: Self-pay | Admitting: Rheumatology

## 2014-01-29 ENCOUNTER — Encounter: Payer: Self-pay | Admitting: Rheumatology

## 2014-02-09 ENCOUNTER — Encounter: Payer: Self-pay | Admitting: Internal Medicine

## 2014-02-14 ENCOUNTER — Other Ambulatory Visit: Payer: Self-pay | Admitting: *Deleted

## 2014-02-14 ENCOUNTER — Encounter: Payer: Self-pay | Admitting: Adult Health

## 2014-02-14 ENCOUNTER — Ambulatory Visit (INDEPENDENT_AMBULATORY_CARE_PROVIDER_SITE_OTHER): Admitting: Adult Health

## 2014-02-14 VITALS — HR 81 | Temp 98.2°F | Wt 233.0 lb

## 2014-02-14 DIAGNOSIS — E785 Hyperlipidemia, unspecified: Secondary | ICD-10-CM | POA: Insufficient documentation

## 2014-02-14 DIAGNOSIS — Z23 Encounter for immunization: Secondary | ICD-10-CM

## 2014-02-14 DIAGNOSIS — Z79899 Other long term (current) drug therapy: Secondary | ICD-10-CM

## 2014-02-14 DIAGNOSIS — R7989 Other specified abnormal findings of blood chemistry: Secondary | ICD-10-CM

## 2014-02-14 DIAGNOSIS — E291 Testicular hypofunction: Secondary | ICD-10-CM

## 2014-02-14 DIAGNOSIS — I10 Essential (primary) hypertension: Secondary | ICD-10-CM

## 2014-02-14 LAB — HEPATIC FUNCTION PANEL
ALBUMIN: 4.3 g/dL (ref 3.5–5.2)
ALK PHOS: 84 U/L (ref 39–117)
ALT: 30 U/L (ref 0–53)
AST: 26 U/L (ref 0–37)
BILIRUBIN DIRECT: 0.1 mg/dL (ref 0.0–0.3)
Total Bilirubin: 0.8 mg/dL (ref 0.3–1.2)
Total Protein: 7 g/dL (ref 6.0–8.3)

## 2014-02-14 LAB — BASIC METABOLIC PANEL
BUN: 19 mg/dL (ref 6–23)
CO2: 26 mEq/L (ref 19–32)
CREATININE: 1.2 mg/dL (ref 0.4–1.5)
Calcium: 9.3 mg/dL (ref 8.4–10.5)
Chloride: 103 mEq/L (ref 96–112)
GFR: 68.09 mL/min (ref >60.00)
Glucose, Bld: 111 mg/dL — ABNORMAL HIGH (ref 70–99)
Potassium: 4.6 mEq/L (ref 3.5–5.1)
SODIUM: 139 meq/L (ref 135–145)

## 2014-02-14 LAB — TESTOSTERONE: Testosterone: 239.65 ng/dL — ABNORMAL LOW (ref 350.00–890.00)

## 2014-02-14 LAB — PSA: PSA: 2.36 ng/mL (ref 0.10–4.00)

## 2014-02-14 MED ORDER — LOSARTAN POTASSIUM-HCTZ 100-12.5 MG PO TABS: 1.0000 | ORAL_TABLET | Freq: Every day | ORAL | Status: DC

## 2014-02-14 MED ORDER — TESTOSTERONE 10 MG/ACT (2%) TD GEL: 60.0000 mg | Freq: Every day | TRANSDERMAL | Status: DC

## 2014-02-14 MED ORDER — ZOSTER VACCINE LIVE 19400 UNT/0.65ML ~~LOC~~ SOLR: 0.6500 mL | Freq: Once | SUBCUTANEOUS | Status: DC

## 2014-02-14 NOTE — Telephone Encounter (Signed)
Faxed Testosterone Gel to [facility]

## 2014-02-14 NOTE — Progress Notes (Signed)
Pre visit review using our clinic review tool, if applicable. No additional management support is needed unless otherwise documented below in the visit note. 

## 2014-02-14 NOTE — Progress Notes (Signed)
Patient ID: Bobby Rich, male   DOB: Nov 13, 1951, 63 y.o.   MRN: [id number]    Subjective:    Patient ID: Bobby Rich, male    DOB: 1951-08-31, 63 y.o.   MRN: [id number]  HPI  Bobby Rich is a pleasant 63 year old male who presents to clinic for medication management and refills. He has a history of hypertension, hyperlipidemia and low testosterone. Denies any problems with medication side effects. Reports taking medications exactly as prescribed. Patient is also requesting shingles vaccine   Past Medical History  Diagnosis Date  . GERD (gastroesophageal reflux disease)   . Low testosterone   . Hyperlipidemia   . Depression   . Hypertension   . Cancer     basel cell skin cancer    Current Outpatient Prescriptions on File Prior to Visit  Medication Sig Dispense Refill  . Alpha-Lipoic Acid 600 MG CAPS Take 1 capsule by mouth daily.      Marland Kitchen aspirin 81 MG tablet Take 81 mg by mouth daily.      Marland Kitchen atorvastatin (LIPITOR) 20 MG tablet TAKE 1 TABLET DAILY  90 tablet  2  . buPROPion (WELLBUTRIN XL) 150 MG 24 hr tablet Take 1 tablet (150 mg total) by mouth daily.  90 tablet  2  . celecoxib (CELEBREX) 200 MG capsule Take 1 capsule (200 mg total) by mouth 2 (two) times daily.  180 capsule  1  . Cholecalciferol (VITAMIN D-3) 1000 UNITS CAPS Take 1 capsule by mouth daily.      . Chromium 500 MCG TABS Take 1 tablet by mouth daily.      . Coenzyme Q10 (CO Q-10) 100 MG CAPS Take 1 capsule by mouth daily.      Marland Kitchen LACTOBACILLUS PO Take 1 capsule by mouth daily.      Marland Kitchen omeprazole (PRILOSEC) 20 MG capsule TAKE 1 CAPSULE (20 MG TOTAL) DAILY  90 capsule  2  . Potassium 99 MG TABS Take 1 tablet by mouth daily.      . Selenium 200 MCG CAPS Take 1 capsule by mouth daily.      . sildenafil (VIAGRA) 100 MG tablet Take 1 tablet (100 mg total) by mouth daily as needed for erectile dysfunction.  18 tablet  3  . Zinc 50 MG CAPS Take 1 capsule by mouth daily.       No current facility-administered medications  on file prior to visit.     Review of Systems  Constitutional: Negative.   HENT: Negative.   Eyes: Negative.   Respiratory: Negative.   Cardiovascular: Negative.   Gastrointestinal: Negative.   Endocrine: Negative.   Genitourinary: Negative.   Musculoskeletal: Negative.   Skin: Negative.   Allergic/Immunologic: Negative.   Neurological: Negative.   Hematological: Negative.   Psychiatric/Behavioral: Negative.        Objective:  Pulse 81  Temp(Src) 98.2 F (36.8 C) (Oral)  Wt 233 lb (105.688 kg)  SpO2 95%   Physical Exam  Constitutional: He is oriented to person, place, and time. He appears well-developed and well-nourished. No distress.  HENT:  Head: Normocephalic and atraumatic.  Eyes: Conjunctivae and EOM are normal.  Neck: Normal range of motion. Neck supple.  Cardiovascular: Normal rate, regular rhythm, normal heart sounds and intact distal pulses.  Exam reveals no gallop and no friction rub.   No murmur heard. Pulmonary/Chest: Effort normal and breath sounds normal. No respiratory distress. He has no wheezes. He has no rales.  Musculoskeletal: Normal range of motion.  Neurological:  He is alert and oriented to person, place, and time. He has normal reflexes. Coordination normal.  Skin: Skin is warm and dry.  Psychiatric: He has a normal mood and affect. His behavior is normal. Judgment and thought content normal.          Assessment & Plan:   1. Low testosterone Currently on testosterone replacement. Check levels and PSA. Refill sent  - Testosterone  2. HTN (hypertension) On losartan/HCTZ. Check labs. Continue to follow - Basic metabolic panel  3. Medication management Medication refills sent to his pharmacy. Labs ordered accordingly - PSA  4. HLD (hyperlipidemia) On lipitor. Check labs. Continue to follow. Refills provided - Hepatic function panel  5. Need for shingles vaccine Prescription sent to pharmacy for administration.

## 2014-02-15 ENCOUNTER — Telehealth: Payer: Self-pay | Admitting: Internal Medicine

## 2014-02-15 NOTE — Telephone Encounter (Signed)
Relevant patient education assigned to patient using Emmi. ° °

## 2014-02-20 NOTE — Telephone Encounter (Signed)
Mailed unread MyChart message to pt  

## 2014-03-31 ENCOUNTER — Encounter: Payer: Self-pay | Admitting: Adult Health

## 2014-03-31 ENCOUNTER — Ambulatory Visit (INDEPENDENT_AMBULATORY_CARE_PROVIDER_SITE_OTHER): Admitting: Adult Health

## 2014-03-31 ENCOUNTER — Telehealth: Payer: Self-pay | Admitting: *Deleted

## 2014-03-31 ENCOUNTER — Other Ambulatory Visit: Payer: Self-pay | Admitting: Internal Medicine

## 2014-03-31 VITALS — BP 124/80 | HR 74 | Temp 98.3°F | Wt 228.0 lb

## 2014-03-31 DIAGNOSIS — B351 Tinea unguium: Secondary | ICD-10-CM

## 2014-03-31 DIAGNOSIS — M5430 Sciatica, unspecified side: Secondary | ICD-10-CM

## 2014-03-31 DIAGNOSIS — M545 Low back pain, unspecified: Secondary | ICD-10-CM

## 2014-03-31 DIAGNOSIS — Z0279 Encounter for issue of other medical certificate: Secondary | ICD-10-CM

## 2014-03-31 DIAGNOSIS — M5440 Lumbago with sciatica, unspecified side: Principal | ICD-10-CM

## 2014-03-31 MED ORDER — TESTOSTERONE 50 MG/5GM (1%) TD GEL: 5.0000 g | Freq: Every day | TRANSDERMAL | Status: DC

## 2014-03-31 NOTE — Progress Notes (Signed)
Patient ID: Bobby Rich, male   DOB: [birth year], 63 y.o.   MRN: [id number]   Subjective:    Patient ID: Bobby Rich, male    DOB: 04-Apr-1951, 63 y.o.   MRN: [id number]  HPI  Patient is a pleasant 63 year old male who presents to clinic with 2 concerns  1. Ongoing chronic back pain. He has a history of spinal stenosis. He is s/p lumbar laminectomy at L3-4, 5 in 2012. He has been dealing with low back pain for greater than one year. He is status post physical therapy for approximately 6 weeks, chiropractor for approximately one year and symptoms have not improved. Recent flare of his symptoms approximately 2 weeks ago. Patient has pain that radiates down his right leg. He has decreased range of motion with lateral movements. He reports difficulty moving from a sitting to a standing position. He is also having pain while he sleeps. This usually occurs when he is turning in the bed. Patient reports that this is significantly interfering with his quality of life. He is a very active gentleman and not able to do what he would like to do.  2. He is concerned that he may have toenail fungus. Reports that his wife had it for numerous years. He would like to be referred to a podiatrist.    Past Medical History  Diagnosis Date  . GERD (gastroesophageal reflux disease)   . Low testosterone   . Hyperlipidemia   . Depression   . Hypertension   . Cancer     basel cell skin cancer     Past Surgical History  Procedure Laterality Date  . Spine surgery  2012    [place] L3 4 5   . Hernia repair Bilateral     with mesh   . Cosmetic surgery       Family History  Problem Relation Age of Onset  . Stroke Mother   . Heart disease Father 84    [facility]   . Early death Father 64  . Heart disease Brother 65    [facility]     History   Social History  . Marital Status: Married    Spouse Name: N/A    Number of Children: N/A  . Years of Education: N/A   Occupational History  . Not on file.    Social History Main Topics  . Smoking status: Never Smoker   . Smokeless tobacco: Never Used  . Alcohol Use: Yes     Comment: social  . Drug Use: No  . Sexual Activity: Not on file   Other Topics Concern  . Not on file   Social History Narrative  . No narrative on file     Current Outpatient Prescriptions on File Prior to Visit  Medication Sig Dispense Refill  . Alpha-Lipoic Acid 600 MG CAPS Take 1 capsule by mouth daily.      Marland Kitchen aspirin 81 MG tablet Take 81 mg by mouth daily.      Marland Kitchen atorvastatin (LIPITOR) 20 MG tablet TAKE 1 TABLET DAILY  90 tablet  2  . buPROPion (WELLBUTRIN XL) 150 MG 24 hr tablet Take 1 tablet (150 mg total) by mouth daily.  90 tablet  2  . celecoxib (CELEBREX) 200 MG capsule Take 1 capsule (200 mg total) by mouth 2 (two) times daily.  180 capsule  1  . Cholecalciferol (VITAMIN D-3) 1000 UNITS CAPS Take 1 capsule by mouth daily.      . Chromium 500 MCG TABS  Take 1 tablet by mouth daily.      . Coenzyme Q10 (CO Q-10) 100 MG CAPS Take 1 capsule by mouth daily.      Marland Kitchen LACTOBACILLUS PO Take 1 capsule by mouth daily.      Marland Kitchen losartan-hydrochlorothiazide (HYZAAR) 100-12.5 MG per tablet Take 1 tablet by mouth daily.  90 tablet  2  . omeprazole (PRILOSEC) 20 MG capsule TAKE 1 CAPSULE (20 MG TOTAL) DAILY  90 capsule  2  . Potassium 99 MG TABS Take 1 tablet by mouth daily.      . Selenium 200 MCG CAPS Take 1 capsule by mouth daily.      . sildenafil (VIAGRA) 100 MG tablet Take 1 tablet (100 mg total) by mouth daily as needed for erectile dysfunction.  18 tablet  3  . Testosterone (FORTESTA) 10 MG/ACT (2%) GEL Place 60 mg onto the skin daily.  18 g  4  . Zinc 50 MG CAPS Take 1 capsule by mouth daily.       No current facility-administered medications on file prior to visit.     Review of Systems  Constitutional: Negative.   HENT: Negative.   Eyes: Negative.   Respiratory: Negative.   Cardiovascular: Negative.   Gastrointestinal: Negative.   Endocrine:  Negative.   Genitourinary: Negative.   Musculoskeletal: Positive for back pain.       Chronic ongoing back pain with radiation down his right leg.   Skin:       Questions whether he has toe nail fungus  Allergic/Immunologic: Negative.   Neurological: Negative.   Hematological: Negative.   Psychiatric/Behavioral: Negative.        Objective:  BP 124/80  Pulse 74  Temp(Src) 98.3 F (36.8 C) (Oral)  Wt 228 lb (103.42 kg)  SpO2 95%   Physical Exam  Constitutional: He is oriented to person, place, and time. He appears well-developed and well-nourished. No distress.  HENT:  Head: Normocephalic and atraumatic.  Eyes: Conjunctivae and EOM are normal.  Neck: Neck supple.  Cardiovascular: Normal rate and regular rhythm.   Pulmonary/Chest: Effort normal. No respiratory distress.  Musculoskeletal:  Difficulty moving from sitting to standing position. Lateral movements decreased. Able to touch his toes.  Neurological: He is alert and oriented to person, place, and time. Coordination normal.  Skin: Skin is warm and dry.  Psychiatric: He has a normal mood and affect. His behavior is normal. Judgment and thought content normal.      Assessment & Plan:   1. Low back pain with radiation Pt has tried conservative treatment for his back pain for greater than 1 year. Ongoing, chronic issues interfering with his QOL. I am sending him for MRI prior to referral to neurosurgery for evaluation. - Ambulatory referral to Neurosurgery - MR Lumbar Spine Wo Contrast; Future  2. Onychomycosis Right great toenail, possibly pinky toenail with onychomycosis. Would like referral to podiatry. Evaluate if treatment such as laser may be option.  - Ambulatory referral to Podiatry

## 2014-03-31 NOTE — Telephone Encounter (Signed)
PA faxed sent to scan and to billing

## 2014-03-31 NOTE — Progress Notes (Signed)
Pre visit review using our clinic review tool, if applicable. No additional management support is needed unless otherwise documented below in the visit note. 

## 2014-03-31 NOTE — Telephone Encounter (Signed)
Approval already in chart for the testosterone.

## 2014-03-31 NOTE — Telephone Encounter (Signed)
Please read email dated 3/17, patient sent requesting a prior authorization for Testim before being referred to urologist. While here for his appointment with Raquel, he inquired about the status of this?

## 2014-03-31 NOTE — Telephone Encounter (Signed)
Patient wants androgel from historical provider have placed form in red folder please advise.

## 2014-04-03 ENCOUNTER — Encounter: Payer: Self-pay | Admitting: Internal Medicine

## 2014-04-04 MED ORDER — TESTOSTERONE 30 MG/ACT TD SOLN: 2.0000 | Freq: Every day | TRANSDERMAL | Status: DC

## 2014-04-04 NOTE — Telephone Encounter (Signed)
Mr Lawhorn now needs Axiron bc ES is out ot Androgel. prescription for axiron printed

## 2014-04-06 ENCOUNTER — Telehealth: Payer: Self-pay | Admitting: Internal Medicine

## 2014-04-06 NOTE — Telephone Encounter (Signed)
Placed new PA in red folder.

## 2014-04-06 NOTE — Telephone Encounter (Signed)
Was contacted by [facility] and they are saying that Dr. Derrel Nip needs to submit prior authorization for axiron testosterone replacement prescription before they will fill.  Ph [phone].

## 2014-04-11 ENCOUNTER — Encounter: Payer: Self-pay | Admitting: Adult Health

## 2014-04-12 ENCOUNTER — Telehealth: Payer: Self-pay

## 2014-04-12 NOTE — Telephone Encounter (Signed)
I received a phone call from [place] stating [facility] called asking for more information on this patient who had been referred to them through Raquel. Caller was unsure of all the information that was needed so she gave me the contact number. I contacted the office and was forwarded to a voicemail. I left a voicemail asking what information was needed for this patient and left a call back number with my ext.

## 2014-04-18 NOTE — Telephone Encounter (Signed)
[facility]ed [facility] and received approval for patient approval number given [id number], approved for 1 year from today.Patient aware called patient to give approval number and case ID. For future reference.

## 2014-04-19 ENCOUNTER — Ambulatory Visit: Payer: Self-pay | Admitting: Adult Health

## 2014-04-20 ENCOUNTER — Encounter: Payer: Self-pay | Admitting: Podiatrist

## 2014-04-20 ENCOUNTER — Ambulatory Visit (INDEPENDENT_AMBULATORY_CARE_PROVIDER_SITE_OTHER): Admitting: Podiatrist

## 2014-04-20 VITALS — BP 129/84 | HR 79 | Resp 16 | Ht 71.0 in | Wt 220.0 lb

## 2014-04-20 DIAGNOSIS — B351 Tinea unguium: Secondary | ICD-10-CM | POA: Diagnosis not present

## 2014-04-20 DIAGNOSIS — B079 Viral wart, unspecified: Secondary | ICD-10-CM

## 2014-04-20 MED ORDER — TAVABOROLE 5 % EX SOLN: 1.0000 [drp] | CUTANEOUS | Status: DC

## 2014-04-20 NOTE — Progress Notes (Signed)
   Subjective:    Patient ID: Bobby Rich, male    DOB: 01-07-1951, 63 y.o.   MRN: [id number]  HPI Comments: i have toenail fungus. It does not hurt. Ive had it for 1 - 2 months. i have done nothing for it.  i have a plantar wart on my left foot under the 5th toe. It does not hurt. i just noticed it a week ago. i have done nothing for it.     Review of Systems  Musculoskeletal: Positive for back pain.  All other systems reviewed and are negative.      Objective:   Physical Exam  Patient is awake, alert, and oriented x 3.  In no acute distress.  Vascular status is intact with palpable pedal pulses at 2/4 DP and PT bilateral and capillary refill time within normal limits. Neurological sensation is also intact bilaterally via Semmes Weinstein monofilament at 5/5 sites. Light touch, vibratory sensation, Achilles tendon reflex is intact. Dermatological exam reveals skin color, turger and texture as normal. No open lesions present.  Musculature intact with dorsiflexion, plantarflexion, inversion, eversion.   Small plantar wart is present sub-left fifth digit. It has multiple capillary budding present. Very superficial in nature. It measures 2 mm in diameter.  Yellowish brownish discoloration is present on the right hallux medial nail border and right fifth digit. It appears to be superficial onychomycosis and appears to have started very recently. He has tried some over-the-counter topicals with no relief.     Assessment & Plan:  Fungal nail right 1st--and fifth toenails  Recommended treatment options and alternatives for the fungus. He would like to try laser and topical therapies first. I wrote a prescription for kerydin through [facility]. He will be seen in the [place] office for laser treatments every month for 3 months. This has been set up for him already. I also applied canthacur as a verruca left foot. I will check this in the next visit as well.

## 2014-04-20 NOTE — Patient Instructions (Signed)
We will schedule your laser visits in the [place] office- I will check to be sure the wart is improving at those visits as well.

## 2014-04-23 ENCOUNTER — Other Ambulatory Visit: Payer: Self-pay | Admitting: Internal Medicine

## 2014-04-25 NOTE — Telephone Encounter (Signed)
Appt 05/30/14

## 2014-04-27 ENCOUNTER — Other Ambulatory Visit: Payer: Self-pay | Admitting: *Deleted

## 2014-04-27 ENCOUNTER — Encounter: Payer: Self-pay | Admitting: Internal Medicine

## 2014-04-28 MED ORDER — LOSARTAN POTASSIUM-HCTZ 100-12.5 MG PO TABS: 1.0000 | ORAL_TABLET | Freq: Every day | ORAL | Status: DC

## 2014-05-08 ENCOUNTER — Encounter: Payer: Self-pay | Admitting: Internal Medicine

## 2014-05-09 ENCOUNTER — Encounter: Payer: Self-pay | Admitting: Adult Health

## 2014-05-09 MED ORDER — ZOSTER VACCINE LIVE 19400 UNT/0.65ML ~~LOC~~ SOLR: 0.6500 mL | Freq: Once | SUBCUTANEOUS | Status: DC

## 2014-05-11 ENCOUNTER — Telehealth: Payer: Self-pay | Admitting: Internal Medicine

## 2014-05-11 NOTE — Telephone Encounter (Signed)
HIS lumbar MRI so orddered by Raquel showed progression of spinal stenosis,  If he is having moderate to severe pian or leg weakness, I advise referral to neurosurgery

## 2014-05-11 NOTE — Telephone Encounter (Signed)
Referral to [facility] neurosurgery already underway. They are waiting for MRI results to be faxed to them. Faxed MRI to [phone]

## 2014-05-19 ENCOUNTER — Encounter: Payer: Self-pay | Admitting: Podiatrist

## 2014-05-19 ENCOUNTER — Ambulatory Visit: Payer: PRIVATE HEALTH INSURANCE | Admitting: Podiatrist

## 2014-05-19 VITALS — BP 119/68 | HR 90 | Resp 12

## 2014-05-19 DIAGNOSIS — B351 Tinea unguium: Secondary | ICD-10-CM

## 2014-05-19 NOTE — Patient Instructions (Signed)

## 2014-05-19 NOTE — Progress Notes (Signed)
Subjective: Patient presents today for lasering of right hallux nail and fifth digital nail. He has also been applying kerydin to the nails as well. He has a verruca plantar aspect of the left foot just below the fifth digit which appears improved at today's visit. Slight glossy appearance is still present.  Objective: Neurovascular status unchanged right first and fifth toenails have a superficial onychomycosis appearance present. All the appearance of verruca left foot is also noted.  Assessment: Mycotic toenails 1 and 5 right  Laserering of Toenails right digital nails 1 and 5 carried out at today's visit via Q-switch YAG laser by QClear Laser at continuous Patient and staff were wearing appropriate laser protective goggles/eyewear Laser device was tested prior to use and safety protocols were followed Frequency of 5 Hz, Level 4, Joules 7.5 delivered.     The patient tolerated the lasering well and will call if any concerns arise. I will see him back in 3 months for lasering #2. I also applied Care to the lesion submetatarsal 5 left

## 2014-05-30 ENCOUNTER — Encounter: Admitting: Internal Medicine

## 2014-06-23 ENCOUNTER — Ambulatory Visit: Admitting: Podiatrist

## 2014-06-23 ENCOUNTER — Ambulatory Visit (INDEPENDENT_AMBULATORY_CARE_PROVIDER_SITE_OTHER): Admitting: Podiatrist

## 2014-06-23 ENCOUNTER — Encounter: Payer: Self-pay | Admitting: Podiatrist

## 2014-06-23 VITALS — BP 132/68 | HR 64 | Resp 17

## 2014-06-23 DIAGNOSIS — B351 Tinea unguium: Secondary | ICD-10-CM

## 2014-06-23 NOTE — Progress Notes (Signed)
Patient presents for follow up of fungus on the hallux and 5th toe Right foot-  Today is his laser treatment #2- he also has a small wart on the left foot under the 5th toe we are treating with canthacur  Laserering of Toenails right carried out at today's visit via Q-switch YAG laser by QClear Laser at continuous Patient and staff were wearing appropriate laser protective goggles/eyewear Laser device was tested prior to use and safety protocols were followed Frequency of 5 Hz, Level 4, Joules 7.5 delivered.        The patient tolerated the lasering well and will call if any concerns arise.  He will be seen back in 1 months for laser 3.   The verucca was treated with canthacur today and he was given instructions for aftercare.  I will see him back for laser retreat and will check on the verucca at that visit as well

## 2014-06-26 ENCOUNTER — Ambulatory Visit (INDEPENDENT_AMBULATORY_CARE_PROVIDER_SITE_OTHER): Admitting: Internal Medicine

## 2014-06-26 ENCOUNTER — Encounter: Payer: Self-pay | Admitting: Internal Medicine

## 2014-06-26 VITALS — BP 136/78 | HR 79 | Temp 98.6°F | Resp 18 | Ht 71.0 in | Wt 231.5 lb

## 2014-06-26 DIAGNOSIS — Z0000 Encounter for general adult medical examination without abnormal findings: Secondary | ICD-10-CM | POA: Diagnosis not present

## 2014-06-26 DIAGNOSIS — E785 Hyperlipidemia, unspecified: Secondary | ICD-10-CM

## 2014-06-26 DIAGNOSIS — Z1159 Encounter for screening for other viral diseases: Secondary | ICD-10-CM

## 2014-06-26 DIAGNOSIS — R7989 Other specified abnormal findings of blood chemistry: Secondary | ICD-10-CM

## 2014-06-26 DIAGNOSIS — E291 Testicular hypofunction: Secondary | ICD-10-CM

## 2014-06-26 DIAGNOSIS — Z79899 Other long term (current) drug therapy: Secondary | ICD-10-CM

## 2014-06-26 DIAGNOSIS — N529 Male erectile dysfunction, unspecified: Secondary | ICD-10-CM

## 2014-06-26 DIAGNOSIS — I10 Essential (primary) hypertension: Secondary | ICD-10-CM

## 2014-06-26 NOTE — Progress Notes (Signed)
Pre-visit discussion using our clinic review tool. No additional management support is needed unless otherwise documented below in the visit note.  

## 2014-06-26 NOTE — Progress Notes (Signed)
Patient ID: Bobby Rich, male   DOB: 01-03-1951, 63 y.o.   MRN: [id number]   The patient is here for his annual male physical examination and management of other chronic and acute problems.   The risk factors are reflected in the social history.  The roster of all physicians providing medical care to patient - is listed in the Snapshot section of the chart.  Activities of daily living:  The patient is 100% independent in all ADLs: dressing, toileting, feeding as well as independent mobility  Home safety : The patient has smoke detectors in the home. He wears seatbelts.  There are no firearms at home. There is no violence in the home.   There is no risks for hepatitis, STDs or HIV. There is no   history of blood transfusion. There is no travel history to infectious disease endemic areas of the world.  The patient has seen their dentist in the last six month and  their eye doctor in the last year.  They do not  have excessive sun exposure. They have seen a dermatoloigist in the last year. Discussed the need for sun protection: hats, long sleeves and use of sunscreen if there is significant sun exposure.   Diet: the importance of a healthy diet is discussed. They do have a healthy diet.  The benefits of regular aerobic exercise were discussed. He exercises a minimum of 30 minutes  5 days per week. Depression screen: there are no signs or vegative symptoms of depression- irritability, change in appetite, anhedonia, sadness/tearfullness.  The following portions of the patient's history were reviewed and updated as appropriate: allergies, current medications, past family history, past medical history,  past surgical history, past social history  and problem list.  Visual acuity was not assessed per patient preference since he has regular follow up with his ophthalmologist. Hearing and body mass index were assessed and reviewed.   During the course of the visit the patient was educated and  counseled about appropriate screening and preventive services including :  nutrition counseling, colorectal cancer screening, and recommended immunizations.    Objective:  BP 136/78  Pulse 79  Temp(Src) 98.6 F (37 C) (Oral)  Resp 18  Ht 5\' 11"  (1.803 m)  Wt 231 lb 8 oz (105.008 kg)  BMI 32.30 kg/m2  SpO2 95%  General Appearance:    Alert, cooperative, no distress, appears stated age  Head:    Normocephalic, without obvious abnormality, atraumatic  Eyes:    PERRL, conjunctiva/corneas clear, EOM's intact, fundi    benign, both eyes       Ears:    Normal TM's and external ear canals, both ears  Nose:   Nares normal, septum midline, mucosa normal, no drainage   or sinus tenderness  Throat:   Lips, mucosa, and tongue normal; teeth and gums normal  Neck:   Supple, symmetrical, trachea midline, no adenopathy;       thyroid:  No enlargement/tenderness/nodules; no carotid   bruit or JVD  Back:     Symmetric, no curvature, ROM normal, no CVA tenderness  Lungs:     Clear to auscultation bilaterally, respirations unlabored  Chest wall:    No tenderness or deformity  Heart:    Regular rate and rhythm, S1 and S2 normal, no murmur, rub   or gallop  Abdomen:     Soft, non-tender, bowel sounds active all four quadrants,    no masses, no organomegaly  Genitalia:    Normal male without lesion,  discharge or tenderness  Rectal:    Normal tone, normal prostate, no masses or tenderness;   guaiac negative stool  Extremities:   Extremities normal, atraumatic, no cyanosis or edema  Pulses:   2+ and symmetric all extremities  Skin:   Skin color, texture, turgor normal, no rashes or lesions  Lymph nodes:   Cervical, supraclavicular, and axillary nodes normal  Neurologic:   CNII-XII intact. Normal strength, sensation and reflexes      throughout   Assessment and Plan:  Essential hypertension, benign Well controlled on current regimen. Renal function has been stable, no changes today. Lab Results   Component Value Date   CREATININE 1.2 02/14/2014     Low testosterone His insurance will cover Fortesta  GELdose pump. We will need a baseline testosterone level prior to initiating therapy.    Male erectile dysfunction managed with testosterone and viagra  Routine general medical examination at a health care facility Annual male exam was done including testicular and prostate exam. PSA was normal march 2015. Colon ca screening was reviewed and options given.    Lab Results  Component Value Date   PSA 2.36 02/14/2014   PSA 2.46 10/07/2013     HLD (hyperlipidemia) .I have addressed  BMI and recommended a low glycemic index diet and weight loss of 105 of body weight to manage hypertriglyceridemia.   I have also recommended that patient start exercising with a goal of 30 minutes of aerobic exercise a minimum of 5 days per week.      Updated Medication List Outpatient Encounter Prescriptions as of 06/26/2014  Medication Sig  . aspirin 81 MG tablet Take 162 mg by mouth daily.   Marland Kitchen atorvastatin (LIPITOR) 20 MG tablet TAKE 1 TABLET DAILY  . buPROPion (WELLBUTRIN XL) 150 MG 24 hr tablet TAKE 1 TABLET DAILY  . celecoxib (CELEBREX) 200 MG capsule Take 1 capsule (200 mg total) by mouth 2 (two) times daily.  . Coenzyme Q10 (CO Q-10) 100 MG CAPS Take 1 capsule by mouth daily.  Marland Kitchen LACTOBACILLUS PO Take 1 capsule by mouth daily.  Marland Kitchen losartan-hydrochlorothiazide (HYZAAR) 100-12.5 MG per tablet Take 1 tablet by mouth daily.  Marland Kitchen omeprazole (PRILOSEC) 20 MG capsule TAKE 1 CAPSULE (20 MG TOTAL) DAILY  . Selenium 200 MCG CAPS Take 1 capsule by mouth daily.  . sildenafil (VIAGRA) 100 MG tablet Take 1 tablet (100 mg total) by mouth daily as needed for erectile dysfunction.  . Testosterone 30 MG/ACT SOLN Place 2 Act onto the skin daily. Under the arm  . Zinc 50 MG CAPS Take 1 capsule by mouth daily.  . [DISCONTINUED] zoster vaccine live, PF, (ZOSTAVAX) 77824 UNT/0.65ML injection Inject 19,400 Units  into the skin once.  . [DISCONTINUED] Alpha-Lipoic Acid 600 MG CAPS Take 1 capsule by mouth daily.  . [DISCONTINUED] Cholecalciferol (VITAMIN D-3) 1000 UNITS CAPS Take 1 capsule by mouth daily.  . [DISCONTINUED] Chromium 500 MCG TABS Take 1 tablet by mouth daily.  . [DISCONTINUED] Potassium 99 MG TABS Take 1 tablet by mouth daily.  . [DISCONTINUED] Tavaborole (KERYDIN) 5 % SOLN Apply 1 drop topically 1 day or 1 dose. Apply 1 drop to the toenail daily.

## 2014-06-26 NOTE — Patient Instructions (Signed)
You had your annual  wellness exam today  Please make an appt with the front desk for a fasting lab visit ; We will contact you with the bloodwork results

## 2014-06-27 ENCOUNTER — Encounter: Payer: Self-pay | Admitting: Internal Medicine

## 2014-06-27 NOTE — Assessment & Plan Note (Signed)
Well controlled on current regimen. Renal function has been stable, no changes today. Lab Results  Component Value Date   CREATININE 1.2 02/14/2014

## 2014-06-27 NOTE — Assessment & Plan Note (Signed)
Annual male exam was done including testicular and prostate exam. PSA was normal march 2015. Colon ca screening was reviewed and options given.    Lab Results  Component Value Date   PSA 2.36 02/14/2014   PSA 2.46 10/07/2013

## 2014-06-27 NOTE — Assessment & Plan Note (Signed)
.  I have addressed  BMI and recommended a low glycemic index diet and weight loss of 105 of body weight to manage hypertriglyceridemia.   I have also recommended that patient start exercising with a goal of 30 minutes of aerobic exercise a minimum of 5 days per week.

## 2014-06-27 NOTE — Assessment & Plan Note (Signed)
managed with testosterone and viagra

## 2014-06-27 NOTE — Assessment & Plan Note (Signed)
His insurance will cover Fortesta  GELdose pump. We will need a baseline testosterone level prior to initiating therapy.

## 2014-06-28 ENCOUNTER — Telehealth: Payer: Self-pay | Admitting: Internal Medicine

## 2014-06-28 DIAGNOSIS — B0229 Other postherpetic nervous system involvement: Secondary | ICD-10-CM

## 2014-06-28 MED ORDER — GABAPENTIN 300 MG PO CAPS: 300.0000 mg | ORAL_CAPSULE | Freq: Three times a day (TID) | ORAL | Status: DC

## 2014-06-28 NOTE — Telephone Encounter (Signed)
Patient notified

## 2014-06-28 NOTE — Assessment & Plan Note (Signed)
Trial of neurontin

## 2014-06-28 NOTE — Telephone Encounter (Signed)
Patient wife called for patient requesting script for Neurotin 300 mg wanting [facility] supply [facility] and [facility] please advise.

## 2014-06-28 NOTE — Telephone Encounter (Signed)
Bobby Rich is not taking neurontin . 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 He may not need it long term.. [facility]

## 2014-07-03 ENCOUNTER — Other Ambulatory Visit (INDEPENDENT_AMBULATORY_CARE_PROVIDER_SITE_OTHER)

## 2014-07-03 DIAGNOSIS — E785 Hyperlipidemia, unspecified: Secondary | ICD-10-CM | POA: Diagnosis not present

## 2014-07-03 DIAGNOSIS — Z79899 Other long term (current) drug therapy: Secondary | ICD-10-CM | POA: Diagnosis not present

## 2014-07-03 DIAGNOSIS — R7989 Other specified abnormal findings of blood chemistry: Secondary | ICD-10-CM

## 2014-07-03 DIAGNOSIS — E291 Testicular hypofunction: Secondary | ICD-10-CM

## 2014-07-03 DIAGNOSIS — Z1159 Encounter for screening for other viral diseases: Secondary | ICD-10-CM

## 2014-07-03 LAB — LDL CHOLESTEROL, DIRECT: LDL DIRECT: 138.6 mg/dL

## 2014-07-03 LAB — CBC WITH DIFFERENTIAL/PLATELET
Basophils Absolute: 0 10*3/uL (ref 0.0–0.1)
Basophils Relative: 0.5 % (ref 0.0–3.0)
EOS PCT: 2.6 % (ref 0.0–5.0)
Eosinophils Absolute: 0.2 10*3/uL (ref 0.0–0.7)
HEMATOCRIT: 44.8 % (ref 39.0–52.0)
Hemoglobin: 14.7 g/dL (ref 13.0–17.0)
LYMPHS ABS: 2.2 10*3/uL (ref 0.7–4.0)
Lymphocytes Relative: 32.6 % (ref 12.0–46.0)
MCHC: 32.8 g/dL (ref 30.0–36.0)
MCV: 86.5 fl (ref 78.0–100.0)
MONO ABS: 0.5 10*3/uL (ref 0.1–1.0)
Monocytes Relative: 7.1 % (ref 3.0–12.0)
NEUTROS PCT: 57.2 % (ref 43.0–77.0)
Neutro Abs: 3.9 10*3/uL (ref 1.4–7.7)
Platelets: 223 10*3/uL (ref 150.0–400.0)
RBC: 5.18 Mil/uL (ref 4.22–5.81)
RDW: 14.5 % (ref 11.5–15.5)
WBC: 6.9 10*3/uL (ref 4.0–10.5)

## 2014-07-03 LAB — COMPREHENSIVE METABOLIC PANEL
ALBUMIN: 4 g/dL (ref 3.5–5.2)
ALK PHOS: 70 U/L (ref 39–117)
ALT: 21 U/L (ref 0–53)
AST: 25 U/L (ref 0–37)
BUN: 20 mg/dL (ref 6–23)
CO2: 25 mEq/L (ref 19–32)
Calcium: 9 mg/dL (ref 8.4–10.5)
Chloride: 102 mEq/L (ref 96–112)
Creatinine, Ser: 1.3 mg/dL (ref 0.4–1.5)
GFR: 59.62 mL/min — AB (ref >60.00)
GLUCOSE: 106 mg/dL — AB (ref 70–99)
Potassium: 4 mEq/L (ref 3.5–5.1)
Sodium: 136 mEq/L (ref 135–145)
Total Bilirubin: 0.5 mg/dL (ref 0.2–1.2)
Total Protein: 7.2 g/dL (ref 6.0–8.3)

## 2014-07-03 LAB — TSH: TSH: 2.25 u[IU]/mL (ref 0.35–4.50)

## 2014-07-03 LAB — LIPID PANEL
CHOLESTEROL: 204 mg/dL — AB (ref 0–200)
HDL: 42.7 mg/dL (ref >39.00)
NonHDL: 161.3
Total CHOL/HDL Ratio: 5
Triglycerides: 228 mg/dL — ABNORMAL HIGH (ref 0.0–149.0)
VLDL: 45.6 mg/dL — ABNORMAL HIGH (ref 0.0–40.0)

## 2014-07-04 LAB — HEPATITIS C ANTIBODY: HCV AB: NEGATIVE

## 2014-07-04 LAB — TESTOSTERONE, FREE, TOTAL, SHBG
SEX HORMONE BINDING: 23 nmol/L (ref 13–71)
TESTOSTERONE FREE: 125.9 pg/mL (ref 47.0–244.0)
TESTOSTERONE-% FREE: 2.5 % (ref 1.6–2.9)
Testosterone: 497 ng/dL (ref 300–890)

## 2014-07-05 ENCOUNTER — Encounter: Payer: Self-pay | Admitting: Internal Medicine

## 2014-07-05 ENCOUNTER — Other Ambulatory Visit: Payer: Self-pay | Admitting: Internal Medicine

## 2014-07-21 ENCOUNTER — Ambulatory Visit (INDEPENDENT_AMBULATORY_CARE_PROVIDER_SITE_OTHER): Admitting: Podiatrist

## 2014-07-21 ENCOUNTER — Ambulatory Visit: Admitting: Podiatrist

## 2014-07-21 DIAGNOSIS — B351 Tinea unguium: Secondary | ICD-10-CM

## 2014-07-21 NOTE — Progress Notes (Signed)
Patient presents for follow up of fungus on the 1st and 5th toe Right foot- Today is his laser treatment #3- he also has a small wart on the left foot under the 5th toe we are treating with canthacur which looks to be resolved at today's visit.  Objective: Yellowish brownish discoloration of the first and fifth toenails on the right foot are noted consistent with onychomycosis. Small wart on the bottom of the left foot sub-fifth toe appears to be resolved with skin tension lines noted throughout.  Assessment: Onychomycosis x2 nails right foot and resolved wart x1 left foot  Laserering of Toenails right carried out at today's visit via Q-switch YAG laser by QClear Laser at continuous  Patient and staff were wearing appropriate laser protective goggles/eyewear  Laser device was tested prior to use and safety protocols were followed  Frequency of 5 Hz, Level 4, Joules 7.5 delivered.  The patient tolerated the lasering well and will call if any concerns arise.   He will be seen back in 1 months for laser 4

## 2014-07-25 ENCOUNTER — Other Ambulatory Visit: Payer: Self-pay | Admitting: *Deleted

## 2014-07-25 DIAGNOSIS — B0229 Other postherpetic nervous system involvement: Secondary | ICD-10-CM

## 2014-07-25 MED ORDER — GABAPENTIN 300 MG PO CAPS: 300.0000 mg | ORAL_CAPSULE | Freq: Three times a day (TID) | ORAL | Status: DC

## 2014-07-26 ENCOUNTER — Other Ambulatory Visit: Payer: Self-pay | Admitting: Internal Medicine

## 2014-07-26 DIAGNOSIS — L989 Disorder of the skin and subcutaneous tissue, unspecified: Secondary | ICD-10-CM

## 2014-09-01 ENCOUNTER — Telehealth: Payer: Self-pay | Admitting: *Deleted

## 2014-09-01 ENCOUNTER — Encounter: Payer: Self-pay | Admitting: Podiatrist

## 2014-09-01 ENCOUNTER — Ambulatory Visit (INDEPENDENT_AMBULATORY_CARE_PROVIDER_SITE_OTHER): Admitting: Podiatrist

## 2014-09-01 ENCOUNTER — Encounter: Payer: Self-pay | Admitting: Internal Medicine

## 2014-09-01 DIAGNOSIS — B351 Tinea unguium: Secondary | ICD-10-CM

## 2014-09-01 MED ORDER — TRAMADOL HCL 50 MG PO TABS: 50.0000 mg | ORAL_TABLET | Freq: Three times a day (TID) | ORAL | Status: DC | PRN

## 2014-09-01 NOTE — Telephone Encounter (Signed)
Ok to refill,  printed rx , e mail sent

## 2014-09-01 NOTE — Telephone Encounter (Signed)
Refill request from [facility], requesting Tramadol 50 mg. See mychart message from pt, regarding this Rx

## 2014-09-01 NOTE — Telephone Encounter (Signed)
Rx faxed

## 2014-09-01 NOTE — Progress Notes (Signed)
Patient presents for follow up of fungus on the 1st and 5th toe Right foot- Today is his laser treatment #4-   Objective: Yellowish brownish discoloration of the first and fifth toenails on the right foot are noted to be growing out nicely from the laser treatments.  Assessment: Onychomycosis x2 nails right foot and resolved wart x1 left foot   Laserering of Toenails right carried out at today's visit via Q-switch YAG laser by QClear Laser at continuous  Patient and staff were wearing appropriate laser protective goggles/eyewear  Laser device was tested prior to use and safety protocols were followed  Frequency of 5 Hz, Level 4, Joules 7.5 delivered.  The patient tolerated the lasering well and will call if any concerns arise.  He will be seen back as needed for follow up

## 2014-09-14 ENCOUNTER — Telehealth: Payer: Self-pay | Admitting: *Deleted

## 2014-09-14 NOTE — Telephone Encounter (Signed)
HE CANNOT TAKE MELOXICAM AND CELEBREX.  Marland Kitchen  HE HAS TO CHOOSE ONE OR THE OTHER

## 2014-09-14 NOTE — Telephone Encounter (Signed)
Fax from pharmacy requesting Meloxicam 15 mg #90 x4.  Review of chart Rx not listed on current Med list.  Please advise

## 2014-09-14 NOTE — Telephone Encounter (Signed)
Spoke with pt, he states he wants to continue to take the Celebrex.

## 2014-09-15 ENCOUNTER — Other Ambulatory Visit: Payer: Self-pay | Admitting: Internal Medicine

## 2014-09-21 DIAGNOSIS — M5417 Radiculopathy, lumbosacral region: Secondary | ICD-10-CM | POA: Insufficient documentation

## 2014-10-18 ENCOUNTER — Other Ambulatory Visit: Payer: Self-pay | Admitting: Internal Medicine

## 2014-10-19 ENCOUNTER — Other Ambulatory Visit: Payer: Self-pay | Admitting: *Deleted

## 2014-10-19 MED ORDER — ATORVASTATIN CALCIUM 20 MG PO TABS: 20.0000 mg | ORAL_TABLET | Freq: Every day | ORAL | Status: DC

## 2014-10-19 NOTE — Progress Notes (Signed)
Patient called requesting 30 day supply of atorvastatin be sent to local pharmacy until he can receive 90 day supply from mail  Order script sent.

## 2014-11-01 ENCOUNTER — Ambulatory Visit (INDEPENDENT_AMBULATORY_CARE_PROVIDER_SITE_OTHER): Admitting: Nurse Practitioner

## 2014-11-01 ENCOUNTER — Encounter: Payer: Self-pay | Admitting: Nurse Practitioner

## 2014-11-01 VITALS — BP 108/62 | HR 73 | Resp 14 | Ht 71.0 in | Wt 207.5 lb

## 2014-11-01 DIAGNOSIS — E785 Hyperlipidemia, unspecified: Secondary | ICD-10-CM

## 2014-11-01 DIAGNOSIS — I10 Essential (primary) hypertension: Secondary | ICD-10-CM

## 2014-11-01 DIAGNOSIS — M545 Low back pain: Secondary | ICD-10-CM

## 2014-11-01 DIAGNOSIS — Z23 Encounter for immunization: Secondary | ICD-10-CM

## 2014-11-01 DIAGNOSIS — R634 Abnormal weight loss: Secondary | ICD-10-CM | POA: Insufficient documentation

## 2014-11-01 LAB — LIPID PANEL
CHOL/HDL RATIO: 4
CHOLESTEROL: 152 mg/dL (ref 0–200)
HDL: 40.7 mg/dL (ref >39.00)
LDL CALC: 91 mg/dL (ref 0–99)
NonHDL: 111.3
Triglycerides: 104 mg/dL (ref 0.0–149.0)
VLDL: 20.8 mg/dL (ref 0.0–40.0)

## 2014-11-01 MED ORDER — LOSARTAN POTASSIUM-HCTZ 50-12.5 MG PO TABS: 1.0000 | ORAL_TABLET | Freq: Every day | ORAL | Status: DC

## 2014-11-01 NOTE — Progress Notes (Signed)
Subjective:    Patient ID: Bobby Rich, male    DOB: 01/21/1951, 63 y.o.   MRN: [id number]  HPI   1) Lost 24 lbs over 1.5 months  Diet-High protein (trim heathy mama), good fats OR good carbs  3 meals a day and good snacks in between, eliminating sugars in diet.  Wt Readings from Last 3 Encounters:  11/01/14 207 lb 8 oz (94.121 kg)  06/26/14 231 lb 8 oz (105.008 kg)  04/20/14 220 lb (99.791 kg)    2) Would like to get off of BP meds and Cholesterol medication  Does not check BP at home Exercise- 30 min 5-6 days a week stepper machine at home   3) Back pain- L 3-4 epidural injections for back, this was helpful. Done at [facility].   4) Is wanting Influenza vaccination today.   Review of Systems  Constitutional: Negative for fever, chills, diaphoresis, fatigue and unexpected weight change.  Respiratory: Negative for cough, chest tightness, shortness of breath and wheezing.   Cardiovascular: Negative for chest pain, palpitations and leg swelling.  Gastrointestinal: Negative for nausea, vomiting, abdominal pain, diarrhea and blood in stool.  Genitourinary: Negative for hematuria.  Musculoskeletal: Positive for back pain.  Skin: Negative for rash.  Neurological: Negative for dizziness, weakness and headaches.   Past Medical History  Diagnosis Date  . GERD (gastroesophageal reflux disease)   . Low testosterone   . Hyperlipidemia   . Depression   . Hypertension   . Cancer     basel cell skin cancer    History   Social History  . Marital Status: Married    Spouse Name: N/A    Number of Children: N/A  . Years of Education: N/A   Occupational History  . Not on file.   Social History Main Topics  . Smoking status: Never Smoker   . Smokeless tobacco: Never Used  . Alcohol Use: Yes     Comment: social  . Drug Use: No  . Sexual Activity: Not on file   Other Topics Concern  . Not on file   Social History Narrative    Past Surgical History  Procedure Laterality  Date  . Spine surgery  2012    [place] L3 4 5   . Hernia repair Bilateral     with mesh   . Cosmetic surgery      Family History  Problem Relation Age of Onset  . Stroke Mother   . Early death Father 54  . Heart disease Father 3    AMI   . Heart disease Brother 25    AMI    No Known Allergies  Current Outpatient Prescriptions on File Prior to Visit  Medication Sig Dispense Refill  . aspirin 81 MG tablet Take 162 mg by mouth daily.     Marland Kitchen atorvastatin (LIPITOR) 20 MG tablet Take 1 tablet (20 mg total) by mouth daily. 30 tablet 0  . Coenzyme Q10 (CO Q-10) 100 MG CAPS Take 1 capsule by mouth daily.    Marland Kitchen LACTOBACILLUS PO Take 1 capsule by mouth daily.    Marland Kitchen omeprazole (PRILOSEC) 20 MG capsule TAKE 1 CAPSULE DAILY 90 capsule 1  . sildenafil (VIAGRA) 100 MG tablet Take 1 tablet (100 mg total) by mouth daily as needed for erectile dysfunction. 18 tablet 3  . Testosterone 30 MG/ACT SOLN Place 2 Act onto the skin daily. Under the arm 270 mL 1   No current facility-administered medications on file prior to visit.  Objective:   Physical Exam  Constitutional: He is oriented to person, place, and time. He appears well-developed and well-nourished. No distress.  HENT:  Head: Normocephalic and atraumatic.  Neck: Normal range of motion. Neck supple.  Cardiovascular: Normal rate, regular rhythm, normal heart sounds and intact distal pulses.   Pulmonary/Chest: Effort normal and breath sounds normal. No respiratory distress. He has no wheezes. He has no rales. He exhibits no tenderness.  Neurological: He is alert and oriented to person, place, and time.  Skin: Skin is dry. He is not diaphoretic.  Psychiatric: He has a normal mood and affect. His behavior is normal. Judgment and thought content normal.   BP 108/62 mmHg  Pulse 73  Resp 14  Ht 5\' 11"  (1.803 m)  Wt 207 lb 8 oz (94.121 kg)  BMI 28.95 kg/m2  SpO2 98%      Assessment & Plan:

## 2014-11-01 NOTE — Patient Instructions (Addendum)
Please visit lab before leaving today. Stop taking the Hyzaar currently on and switch to new dosage when it comes in from [place].  Take BP at home when possible and keep a log for [place] to see.  We will follow up in 6 weeks.  When we get the results back from the lipid panel we will decide on the Lipitor.  Happy Holidays!

## 2014-11-01 NOTE — Assessment & Plan Note (Addendum)
Improved- Diet improved as well as wt loss of 24 lbs in last 1.5 months. Repeat Fasting lipid panel today in order to determine if we can lower Lipitor dosage. Will determine after results come back.

## 2014-11-01 NOTE — Progress Notes (Signed)
Pre visit review using our clinic review tool, if applicable. No additional management support is needed unless otherwise documented below in the visit note. 

## 2014-11-01 NOTE — Assessment & Plan Note (Signed)
Stable- Seen at [facility] by Dr. Macario Carls. Recent CT guided ESIs done at L3-4 through [facility]. He states they were helpful.

## 2014-11-01 NOTE — Assessment & Plan Note (Signed)
Improved. Diet of protein with good fats OR good carbs (called this the trim healthy mama diet). Eats three meals daily with healthy snacks. He has lost 24 lbs in 1.5 months on this diet.

## 2014-11-01 NOTE — Assessment & Plan Note (Signed)
Improved. Patient has lost 24 lbs in recent month and a half. BP today was 108/62 pulse 73. Would like to try lower dose of current BP med and eventually stop taking BP medication. Discontinued Hyzaar 100-12.5. Rx for Hyzaar 50- 12.5 mg daily. Will follow up in 6 weeks. Instructed to take BP at home at different times and keep a record to bring [place] at follow up.

## 2014-11-03 ENCOUNTER — Other Ambulatory Visit: Payer: Self-pay | Admitting: *Deleted

## 2014-11-03 MED ORDER — LOSARTAN POTASSIUM-HCTZ 50-12.5 MG PO TABS: 1.0000 | ORAL_TABLET | Freq: Every day | ORAL | Status: DC

## 2014-11-06 ENCOUNTER — Encounter: Payer: Self-pay | Admitting: Internal Medicine

## 2014-12-13 ENCOUNTER — Encounter: Payer: Self-pay | Admitting: Nurse Practitioner

## 2014-12-13 ENCOUNTER — Ambulatory Visit (INDEPENDENT_AMBULATORY_CARE_PROVIDER_SITE_OTHER): Admitting: Nurse Practitioner

## 2014-12-13 VITALS — BP 100/58 | HR 75 | Temp 97.5°F | Resp 12 | Ht 71.0 in | Wt 204.4 lb

## 2014-12-13 DIAGNOSIS — R634 Abnormal weight loss: Secondary | ICD-10-CM

## 2014-12-13 DIAGNOSIS — E785 Hyperlipidemia, unspecified: Secondary | ICD-10-CM

## 2014-12-13 DIAGNOSIS — S60948A Unspecified superficial injury of other finger, initial encounter: Secondary | ICD-10-CM

## 2014-12-13 DIAGNOSIS — S60459A Superficial foreign body of unspecified finger, initial encounter: Secondary | ICD-10-CM

## 2014-12-13 NOTE — Patient Instructions (Addendum)
Stop the lipitor and Hyzaar. Keep an eye on your BP and let me know if you are having any issues in the mean time until we can re-check in Feb.   Lipid panel is in, please make a fasting lab appointment upon leaving today.

## 2014-12-13 NOTE — Progress Notes (Signed)
Pre visit review using our clinic review tool, if applicable. No additional management support is needed unless otherwise documented below in the visit note. 

## 2014-12-13 NOTE — Progress Notes (Signed)
Subjective:    Patient ID: Bobby Rich, male    DOB: 01/10/1951, 64 y.o.   MRN: [id number]  HPI  Bobby Rich is a 64 yo male with a CC of HTN, hyperlipidemia, and splinter in right index finger.   1) HTN- Blood pressure has been excellent and in control. It tends to be towards the lower side. Pt checks BP at home and records the BP and Pulse. He is very compliant and doing well with diet. He informed me he has slacked off exercise since Christmas time.  2) Hyperlipidemia- pt is well controlled on Lipitor 10 mg and  3) Splinter in right pointer finger    Review of Systems  Constitutional: Negative for fever, chills, diaphoresis and fatigue.  Eyes: Negative for visual disturbance.  Respiratory: Negative for chest tightness, shortness of breath and wheezing.   Cardiovascular: Negative for chest pain, palpitations and leg swelling.  Gastrointestinal: Negative for nausea, vomiting and diarrhea.  Musculoskeletal: Negative for myalgias.  Skin: Negative for rash.       Minor splinter on right finger.   Neurological: Negative for dizziness, weakness, numbness and headaches.   Past Medical History  Diagnosis Date  . GERD (gastroesophageal reflux disease)   . Low testosterone   . Hyperlipidemia   . Depression   . Hypertension   . Cancer     basel cell skin cancer    History   Social History  . Marital Status: Married    Spouse Name: N/A    Number of Children: N/A  . Years of Education: N/A   Occupational History  . Not on file.   Social History Main Topics  . Smoking status: Never Smoker   . Smokeless tobacco: Never Used  . Alcohol Use: Yes     Comment: social  . Drug Use: No  . Sexual Activity: Not on file   Other Topics Concern  . Not on file   Social History Narrative    Past Surgical History  Procedure Laterality Date  . Spine surgery  2012    [place] L3 4 5   . Hernia repair Bilateral     with mesh   . Cosmetic surgery      Family History    Problem Relation Age of Onset  . Stroke Mother   . Early death Father 12  . Heart disease Father 65    AMI   . Heart disease Brother 24    AMI    No Known Allergies  Current Outpatient Prescriptions on File Prior to Visit  Medication Sig Dispense Refill  . aspirin 81 MG tablet Take 162 mg by mouth daily.     Marland Kitchen atorvastatin (LIPITOR) 20 MG tablet Take 1 tablet (20 mg total) by mouth daily. 30 tablet 0  . Coenzyme Q10 (CO Q-10) 100 MG CAPS Take 1 capsule by mouth daily.    Marland Kitchen LACTOBACILLUS PO Take 1 capsule by mouth daily.    Marland Kitchen losartan-hydrochlorothiazide (HYZAAR) 50-12.5 MG per tablet Take 1 tablet by mouth daily. 90 tablet 1  . omeprazole (PRILOSEC) 20 MG capsule TAKE 1 CAPSULE DAILY 90 capsule 1  . sildenafil (VIAGRA) 100 MG tablet Take 1 tablet (100 mg total) by mouth daily as needed for erectile dysfunction. 18 tablet 3  . Testosterone 30 MG/ACT SOLN Place 2 Act onto the skin daily. Under the arm 270 mL 1   No current facility-administered medications on file prior to visit.       Objective:  Physical Exam  Constitutional: He is oriented to person, place, and time. He appears well-developed and well-nourished. No distress.  HENT:  Head: Normocephalic and atraumatic.  Cardiovascular: Normal rate and regular rhythm.  Exam reveals no gallop and no friction rub.   No murmur heard. Pulmonary/Chest: Effort normal and breath sounds normal. No respiratory distress. He has no wheezes. He has no rales. He exhibits no tenderness.  Neurological: He is alert and oriented to person, place, and time. Coordination normal.  Skin: Skin is warm and dry. No rash noted. He is not diaphoretic.  Psychiatric: He has a normal mood and affect. His behavior is normal. Judgment and thought content normal.   BP 100/58 mmHg  Pulse 75  Temp(Src) 97.5 F (36.4 C) (Oral)  Resp 12  Ht 5\' 11"  (1.803 m)  Wt 204 lb 6.4 oz (92.715 kg)  BMI 28.52 kg/m2  SpO2 95%     Assessment & Plan:

## 2014-12-15 DIAGNOSIS — S60459A Superficial foreign body of unspecified finger, initial encounter: Secondary | ICD-10-CM | POA: Insufficient documentation

## 2014-12-15 NOTE — Assessment & Plan Note (Signed)
Stable. Pt has small splinter from doing electrical work. It was removed with forceps.

## 2014-12-15 NOTE — Assessment & Plan Note (Signed)
Pt is doing well! Will remove lipitor and hyzaar from his regimen. He will keep an eye on his BP at home and report any highs. He is following up for blood work in 2 months with Dr. Derrel Nip.

## 2014-12-21 ENCOUNTER — Other Ambulatory Visit

## 2014-12-28 ENCOUNTER — Other Ambulatory Visit (INDEPENDENT_AMBULATORY_CARE_PROVIDER_SITE_OTHER)

## 2014-12-28 DIAGNOSIS — E785 Hyperlipidemia, unspecified: Secondary | ICD-10-CM

## 2014-12-28 LAB — LIPID PANEL
CHOL/HDL RATIO: 4
CHOLESTEROL: 199 mg/dL (ref 0–200)
HDL: 45.4 mg/dL (ref >39.00)
LDL CALC: 120 mg/dL — AB (ref 0–99)
NONHDL: 153.6
TRIGLYCERIDES: 166 mg/dL — AB (ref 0.0–149.0)
VLDL: 33.2 mg/dL (ref 0.0–40.0)

## 2015-01-24 ENCOUNTER — Ambulatory Visit: Admitting: Internal Medicine

## 2015-03-10 ENCOUNTER — Other Ambulatory Visit: Payer: Self-pay | Admitting: Internal Medicine

## 2015-03-12 MED ORDER — OMEPRAZOLE 20 MG PO CPDR: 20.0000 mg | DELAYED_RELEASE_CAPSULE | Freq: Every day | ORAL | Status: DC

## 2015-03-12 NOTE — Addendum Note (Signed)
Addended by: Wynonia Lawman E on: 03/12/2015 09:40 AM   Modules accepted: Orders

## 2015-03-25 NOTE — Op Note (Signed)
PATIENT NAME:  Bobby Rich, Bobby Rich MR#:  [id number] DATE OF BIRTH:  August 07, 1951  DATE OF PROCEDURE:  10/30/2011  PREOPERATIVE DIAGNOSIS: Lumbar spinal stenosis with radiculopathy.  POSTOPERATIVE DIAGNOSIS: Lumbar spinal stenosis with radiculopathy.  PROCEDURES PERFORMED: 1. Lumbar partial hemilaminectomy, partial facetectomy and foraminotomy bilaterally, L4-L5.  2. Lumbar partial hemilaminectomy, partial facetectomy and foraminotomy and decompression, right side, L3-L4.   SURGEON: Albesa Seen, MD  ASSISTANT: Greig Castilla, PA-C  ANESTHESIA: General.   ESTIMATED BLOOD LOSS: 100 mL.  REPLACEMENTS: 1700 mL Crystalloid.  DRAINS: None.  COMPLICATIONS: None.  IMPLANTS USED: None.   BRIEF CLINICAL NOTE AND PATHOLOGY: The patient had persistent radicular pain unresponsive to conservative treatment with documented stenosis. Options, risks, and benefits were discussed in detail. The symptoms were primarily in the right leg and then somewhat on the left side in the L5 distribution. There was very significant stenosis noted on the right side at L3-L4 and L4-L5 and left side L4-L5. Options, risks, and benefits were discussed in detail. The patient elected to proceed. There was no need for diskectomy as once the ligamentum flavum was removed and partial facetectomy and foraminotomy were performed, there was decompression of the nerve roots.    DESCRIPTION OF PROCEDURE: Preop antibiotics, adequate general anesthesia, prone position, all prominences well-padded. Routine prepping and draping. Appropriate time out was called. Lateral fluoroscopy was used to identify the appropriate level. Routine posterior incision was made. Subperiosteal dissection was performed.  A self-retaining retractor was placed.  This was repositioned throughout the procedure.   The microscope was then brought onto the field and the right side partial hemilaminectomy was performed with the bur followed by the Kerrison rongeur.  Ligamentum flavum was carefully excised.  A small perforation of the dura occurred at the axilla of the L5 nerve root. There was no CSF leak after this was allowed to sit for awhile, but it was covered with DuraSeal at the end of the procedure.  The decompression was completed. Attention was then turned to L3-L4 on the right side where the same procedure was performed. Again partial facetectomy and foraminotomy was performed.  Attention was then turned to the left side at L4-L5. The same procedure was performed again with partial facetectomy and foraminotomy.    The incision was thoroughly irrigated, hemostasis was good. The dura on the right side at L4-L5 was covered with DuraSeal in routine fashion. The incision was then covered with Surgiflo. The fascia was closed with #2 Quill. The subcutaneous was closed with #0 Quill, and the skin closed with staples and skin glue. A soft sterile dressing was applied. Sponge, instrument, and needle counts were reported as correct prior to and after wound closure. The patient was awakened and taken to the PAC-U having tolerated the procedure well.  ____________________________ Alysia Penna. Mauri Pole, MD jcc:slb D: 10/30/2011 16:57:16 ET T: 10/30/2011 17:26:57 ET JOB#: [id number]  cc: Alysia Penna. Mauri Pole, MD, <Dictator> Alysia Penna Pati Thinnes MD ELECTRONICALLY SIGNED 12/02/2011 13:26

## 2015-04-01 ENCOUNTER — Encounter: Payer: Self-pay | Admitting: Internal Medicine

## 2015-04-01 DIAGNOSIS — T753XXA Motion sickness, initial encounter: Secondary | ICD-10-CM | POA: Insufficient documentation

## 2015-04-01 MED ORDER — SCOPOLAMINE 1 MG/3DAYS TD PT72: 1.0000 | MEDICATED_PATCH | TRANSDERMAL | Status: DC

## 2015-04-01 NOTE — Telephone Encounter (Signed)
mychart message sent

## 2015-04-02 ENCOUNTER — Telehealth: Payer: Self-pay | Admitting: *Deleted

## 2015-04-02 NOTE — Telephone Encounter (Signed)
Fax from pharmacy, needing PA for transderm-scop patches. Started online, placed in Dr. Lupita Dawn box for completion.

## 2015-04-02 NOTE — Telephone Encounter (Signed)
Placed in red folder  

## 2015-04-09 MED ORDER — SCOPOLAMINE 1 MG/3DAYS TD PT72: 1.0000 | MEDICATED_PATCH | TRANSDERMAL | Status: DC

## 2015-04-09 NOTE — Addendum Note (Signed)
Addended by: Wynonia Lawman E on: 04/09/2015 04:47 PM   Modules accepted: Orders

## 2015-05-07 ENCOUNTER — Ambulatory Visit (INDEPENDENT_AMBULATORY_CARE_PROVIDER_SITE_OTHER): Admitting: Internal Medicine

## 2015-05-07 ENCOUNTER — Encounter: Payer: Self-pay | Admitting: Internal Medicine

## 2015-05-07 VITALS — BP 130/80 | HR 66 | Temp 97.5°F | Ht 71.0 in | Wt 206.8 lb

## 2015-05-07 DIAGNOSIS — W57XXXA Bitten or stung by nonvenomous insect and other nonvenomous arthropods, initial encounter: Secondary | ICD-10-CM | POA: Diagnosis not present

## 2015-05-07 DIAGNOSIS — S30861A Insect bite (nonvenomous) of abdominal wall, initial encounter: Secondary | ICD-10-CM | POA: Diagnosis not present

## 2015-05-07 MED ORDER — DOXYCYCLINE HYCLATE 100 MG PO CAPS: 100.0000 mg | ORAL_CAPSULE | Freq: Two times a day (BID) | ORAL | Status: DC

## 2015-05-07 NOTE — Patient Instructions (Signed)
Tick Bite Information Ticks are insects that attach themselves to the skin and draw blood for food. There are various types of ticks. Common types include wood ticks and deer ticks. Most ticks live in shrubs and grassy areas. Ticks can climb onto your body when you make contact with leaves or grass where the tick is waiting. The most common places on the body for ticks to attach themselves are the scalp, neck, armpits, waist, and groin. Most tick bites are harmless, but sometimes ticks carry germs that cause diseases. These germs can be spread to a person during the tick's feeding process. The chance of a disease spreading through a tick bite depends on:   The type of tick.  Time of year.   How long the tick is attached.   Geographic location.  HOW CAN YOU PREVENT TICK BITES? Take these steps to help prevent tick bites when you are outdoors:  Wear protective clothing. Long sleeves and long pants are best.   Wear white clothes so you can see ticks more easily.  Tuck your pant legs into your socks.   If walking on a trail, stay in the middle of the trail to avoid brushing against bushes.  Avoid walking through areas with long grass.  Put insect repellent on all exposed skin and along boot tops, pant legs, and sleeve cuffs.   Check clothing, hair, and skin repeatedly and before going inside.   Brush off any ticks that are not attached.  Take a shower or bath as soon as possible after being outdoors.  WHAT IS THE PROPER WAY TO REMOVE A TICK? Ticks should be removed as soon as possible to help prevent diseases caused by tick bites. 1. If latex gloves are available, put them on before trying to remove a tick.  2. Using fine-point tweezers, grasp the tick as close to the skin as possible. You may also use curved forceps or a tick removal tool. Grasp the tick as close to its head as possible. Avoid grasping the tick on its body. 3. Pull gently with steady upward pressure until  the tick lets go. Do not twist the tick or jerk it suddenly. This may break off the tick's head or mouth parts. 4. Do not squeeze or crush the tick's body. This could force disease-carrying fluids from the tick into your body.  5. After the tick is removed, wash the bite area and your hands with soap and water or other disinfectant such as alcohol. 6. Apply a small amount of antiseptic cream or ointment to the bite site.  7. Wash and disinfect any instruments that were used.  Do not try to remove a tick by applying a hot match, petroleum jelly, or fingernail polish to the tick. These methods do not work and may increase the chances of disease being spread from the tick bite.  WHEN SHOULD YOU SEEK MEDICAL CARE? Contact your health care provider if you are unable to remove a tick from your skin or if a part of the tick breaks off and is stuck in the skin.  After a tick bite, you need to be aware of signs and symptoms that could be related to diseases spread by ticks. Contact your health care provider if you develop any of the following in the days or weeks after the tick bite:  Unexplained fever.  Rash. A circular rash that appears days or weeks after the tick bite may indicate the possibility of Lyme disease. The rash may resemble   a target with a bull's-eye and may occur at a different part of your body than the tick bite.  Redness and swelling in the area of the tick bite.   Tender, swollen lymph glands.   Diarrhea.   Weight loss.   Cough.   Fatigue.   Muscle, joint, or bone pain.   Abdominal pain.   Headache.   Lethargy or a change in your level of consciousness.  Difficulty walking or moving your legs.   Numbness in the legs.   Paralysis.  Shortness of breath.   Confusion.   Repeated vomiting.  Document Released: 11/14/2000 Document Revised: 09/07/2013 Document Reviewed: 04/27/2013 Exit[employer] Patient Information 2015 [employer]. This information is  not intended to replace advice given to you by your health care provider. Make sure you discuss any questions you have with your health care provider.  

## 2015-05-07 NOTE — Progress Notes (Signed)
Subjective:  Patient ID: Bobby Rich, male    DOB: 01/30/1951  Age: 64 y.o. MRN: [id number]  CC: The encounter diagnosis was Tick bite of abdominal wall, initial encounter.  HPI [name] presents for systemic symptoms including  A low grade headache and myalgias for several days  following removal of an engorged tick  One week ago .  Thinks  it had been on his abdominal  wall for several weeks because he thought it was a skin tag but it kept enlarging until he realized it was an engorged tick.   Outpatient Prescriptions Prior to Visit  Medication Sig Dispense Refill  . aspirin 81 MG tablet Take 162 mg by mouth daily.     . Coenzyme Q10 (CO Q-10) 100 MG CAPS Take 1 capsule by mouth daily.    Marland Kitchen LACTOBACILLUS PO Take 1 capsule by mouth daily.    . sildenafil (VIAGRA) 100 MG tablet Take 1 tablet (100 mg total) by mouth daily as needed for erectile dysfunction. 18 tablet 3  . atorvastatin (LIPITOR) 20 MG tablet Take 1 tablet (20 mg total) by mouth daily. 30 tablet 0  . losartan-hydrochlorothiazide (HYZAAR) 50-12.5 MG per tablet Take 1 tablet by mouth daily. 90 tablet 1  . omeprazole (PRILOSEC) 20 MG capsule TAKE 1 CAPSULE DAILY 90 capsule 1  . omeprazole (PRILOSEC) 20 MG capsule Take 1 capsule (20 mg total) by mouth daily. 90 capsule 1  . scopolamine (TRANSDERM-SCOP, 1.5 MG,) 1 MG/3DAYS Place 1 patch (1.5 mg total) onto the skin every 3 (three) days. 4 patch 0  . Testosterone 30 MG/ACT SOLN Place 2 Act onto the skin daily. Under the arm 270 mL 1   No facility-administered medications prior to visit.    Review of Systems;  Patient denies headache, fevers, malaise, unintentional weight loss, skin rash, eye pain, sinus congestion and sinus pain, sore throat, dysphagia,  hemoptysis , cough, dyspnea, wheezing, chest pain, palpitations, orthopnea, edema, abdominal pain, nausea, melena, diarrhea, constipation, flank pain, dysuria, hematuria, urinary  Frequency, nocturia, numbness,  tingling, seizures,  Focal weakness, Loss of consciousness,  Tremor, insomnia, depression, anxiety, and suicidal ideation.      Objective:  BP 130/80 mmHg  Pulse 66  Temp(Src) 97.5 F (36.4 C) (Oral)  Ht 5\' 11"  (1.803 m)  Wt 206 lb 12 oz (93.781 kg)  BMI 28.85 kg/m2  SpO2 95%  BP Readings from Last 3 Encounters:  05/07/15 130/80  12/13/14 100/58  11/01/14 108/62    Wt Readings from Last 3 Encounters:  05/07/15 206 lb 12 oz (93.781 kg)  12/13/14 204 lb 6.4 oz (92.715 kg)  11/01/14 207 lb 8 oz (94.121 kg)    General appearance: alert, cooperative and appears stated age Ears: normal TM's and external ear canals both ears Throat: lips, mucosa, and tongue normal; teeth and gums normal Neck: no adenopathy, no carotid bruit, supple, symmetrical, trachea midline and thyroid not enlarged, symmetric, no tenderness/mass/nodules Back: symmetric, no curvature. ROM normal. No CVA tenderness. Lungs: clear to auscultation bilaterally Heart: regular rate and rhythm, S1, S2 normal, no murmur, click, rub or gallop Abdomen: small papular cyst on abd wall,  No eryhema or drainage. soft, non-tender; bowel sounds normal; no masses,  no organomegaly Pulses: 2+ and symmetric Skin: Skin color, texture, turgor normal. No rashes or lesions Lymph nodes: Cervical, supraclavicular, and axillary nodes normal.  No results found for: HGBA1C  Lab Results  Component Value Date   CREATININE 1.3 07/03/2014   CREATININE 1.2  02/14/2014   CREATININE 1.0 10/07/2013    Lab Results  Component Value Date   WBC 6.9 07/03/2014   HGB 14.7 07/03/2014   HCT 44.8 07/03/2014   PLT 223.0 07/03/2014   GLUCOSE 106* 07/03/2014   CHOL 199 12/28/2014   TRIG 166.0* 12/28/2014   HDL 45.40 12/28/2014   LDLDIRECT 138.6 07/03/2014   LDLCALC 120* 12/28/2014   ALT 21 07/03/2014   AST 25 07/03/2014   NA 136 07/03/2014   K 4.0 07/03/2014   CL 102 07/03/2014   CREATININE 1.3 07/03/2014   BUN 20 07/03/2014   CO2 25  07/03/2014   TSH 2.25 07/03/2014   PSA 2.36 02/14/2014    No results found.  Assessment & Plan:   Problem List Items Addressed This Visit    Tick bite of abdominal wall - Primary    Given his systemic symptoms,  Treating with doxyycyline,  Advised to take a probiotic.          I have discontinued Mr. Zelman Testosterone, atorvastatin, losartan-hydrochlorothiazide, omeprazole, omeprazole, and scopolamine. I am also having him start on doxycycline. Additionally, I am having him maintain his aspirin, Co Q-10, LACTOBACILLUS PO, sildenafil, and multivitamin.  Meds ordered this encounter  Medications  . Multiple Vitamin (MULTIVITAMIN) tablet    Sig: Take 1 tablet by mouth daily.  Marland Kitchen doxycycline (VIBRAMYCIN) 100 MG capsule    Sig: Take 1 capsule (100 mg total) by mouth 2 (two) times daily.    Dispense:  14 capsule    Refill:  0    Medications Discontinued During This Encounter  Medication Reason  . Testosterone 30 MG/ACT SOLN Patient has not taken in last 30 days  . scopolamine (TRANSDERM-SCOP, 1.5 MG,) 1 MG/3DAYS Patient has not taken in last 30 days  . omeprazole (PRILOSEC) 20 MG capsule Patient has not taken in last 30 days  . omeprazole (PRILOSEC) 20 MG capsule Patient has not taken in last 30 days  . losartan-hydrochlorothiazide (HYZAAR) 50-12.5 MG per tablet Patient has not taken in last 30 days  . atorvastatin (LIPITOR) 20 MG tablet Patient has not taken in last 30 days    Follow-up: No Follow-up on file.   Crecencio Mc, MD

## 2015-05-07 NOTE — Progress Notes (Signed)
Pre visit review using our clinic review tool, if applicable. No additional management support is needed unless otherwise documented below in the visit note. 

## 2015-05-08 NOTE — Assessment & Plan Note (Signed)
Given his systemic symptoms,  Treating with doxyycyline,  Advised to take a probiotic.

## 2015-06-28 ENCOUNTER — Encounter: Payer: Self-pay | Admitting: Internal Medicine

## 2015-06-28 ENCOUNTER — Ambulatory Visit (INDEPENDENT_AMBULATORY_CARE_PROVIDER_SITE_OTHER): Admitting: Internal Medicine

## 2015-06-28 VITALS — BP 132/78 | HR 70 | Temp 97.9°F | Resp 14 | Ht 71.0 in | Wt 203.5 lb

## 2015-06-28 DIAGNOSIS — I10 Essential (primary) hypertension: Secondary | ICD-10-CM | POA: Diagnosis not present

## 2015-06-28 DIAGNOSIS — R5383 Other fatigue: Secondary | ICD-10-CM | POA: Diagnosis not present

## 2015-06-28 DIAGNOSIS — Z0000 Encounter for general adult medical examination without abnormal findings: Secondary | ICD-10-CM | POA: Diagnosis not present

## 2015-06-28 DIAGNOSIS — Z125 Encounter for screening for malignant neoplasm of prostate: Secondary | ICD-10-CM

## 2015-06-28 DIAGNOSIS — E559 Vitamin D deficiency, unspecified: Secondary | ICD-10-CM

## 2015-06-28 DIAGNOSIS — E663 Overweight: Secondary | ICD-10-CM

## 2015-06-28 DIAGNOSIS — E785 Hyperlipidemia, unspecified: Secondary | ICD-10-CM

## 2015-06-28 LAB — CBC WITH DIFFERENTIAL/PLATELET
BASOS ABS: 0 10*3/uL (ref 0.0–0.1)
Basophils Relative: 0.3 % (ref 0.0–3.0)
EOS ABS: 0.1 10*3/uL (ref 0.0–0.7)
EOS PCT: 1.8 % (ref 0.0–5.0)
HEMATOCRIT: 47 % (ref 39.0–52.0)
HEMOGLOBIN: 15.8 g/dL (ref 13.0–17.0)
LYMPHS ABS: 2.1 10*3/uL (ref 0.7–4.0)
Lymphocytes Relative: 33.1 % (ref 12.0–46.0)
MCHC: 33.5 g/dL (ref 30.0–36.0)
MCV: 87.3 fl (ref 78.0–100.0)
MONOS PCT: 5.3 % (ref 3.0–12.0)
Monocytes Absolute: 0.3 10*3/uL (ref 0.1–1.0)
NEUTROS ABS: 3.8 10*3/uL (ref 1.4–7.7)
Neutrophils Relative %: 59.5 % (ref 43.0–77.0)
Platelets: 201 10*3/uL (ref 150.0–400.0)
RBC: 5.39 Mil/uL (ref 4.22–5.81)
RDW: 13.7 % (ref 11.5–15.5)
WBC: 6.4 10*3/uL (ref 4.0–10.5)

## 2015-06-28 LAB — COMPREHENSIVE METABOLIC PANEL
ALT: 18 U/L (ref 0–53)
AST: 16 U/L (ref 0–37)
Albumin: 4.6 g/dL (ref 3.5–5.2)
Alkaline Phosphatase: 83 U/L (ref 39–117)
BILIRUBIN TOTAL: 0.7 mg/dL (ref 0.2–1.2)
BUN: 14 mg/dL (ref 6–23)
CHLORIDE: 103 meq/L (ref 96–112)
CO2: 29 mEq/L (ref 19–32)
Calcium: 9.3 mg/dL (ref 8.4–10.5)
Creatinine, Ser: 0.97 mg/dL (ref 0.40–1.50)
GFR: 83.32 mL/min (ref >60.00)
Glucose, Bld: 95 mg/dL (ref 70–99)
POTASSIUM: 4.4 meq/L (ref 3.5–5.1)
SODIUM: 139 meq/L (ref 135–145)
Total Protein: 7.3 g/dL (ref 6.0–8.3)

## 2015-06-28 LAB — LIPID PANEL
Cholesterol: 241 mg/dL — ABNORMAL HIGH (ref 0–200)
HDL: 54.5 mg/dL (ref >39.00)
LDL Cholesterol: 155 mg/dL — ABNORMAL HIGH (ref 0–99)
NonHDL: 186.17
Total CHOL/HDL Ratio: 4
Triglycerides: 154 mg/dL — ABNORMAL HIGH (ref 0.0–149.0)
VLDL: 30.8 mg/dL (ref 0.0–40.0)

## 2015-06-28 LAB — PSA: PSA: 2.9 ng/mL (ref 0.10–4.00)

## 2015-06-28 LAB — TSH: TSH: 1.61 u[IU]/mL (ref 0.35–4.50)

## 2015-06-28 LAB — VITAMIN D 25 HYDROXY (VIT D DEFICIENCY, FRACTURES): VITD: 48.26 ng/mL (ref 30.00–100.00)

## 2015-06-28 NOTE — Patient Instructions (Signed)

## 2015-06-28 NOTE — Progress Notes (Signed)
Patient ID: Bobby Rich, male    DOB: 02/20/1951  Age: 64 y.o. MRN: [id number]  The patient is here for annual  wellness examination and management of other chronic and acute problems.  Last colonoscopy was normal in 2013 PSA 2.36 stablelast check March 2015   The risk factors are reflected in the social history.  The roster of all physicians providing medical care to patient - is listed in the Snapshot section of the chart.  Activities of daily living:  The patient is 100% independent in all ADLs: dressing, toileting, feeding as well as independent mobility  Home safety : The patient has smoke detectors in the home. They wear seatbelts.  There are no firearms at home. There is no violence in the home.   There is no risks for hepatitis, STDs or HIV. There is no   history of blood transfusion. They have no travel history to infectious disease endemic areas of the world.  The patient has seen their dentist in the last six month. They have seen their eye doctor in the last year. They admit to slight hearing difficulty with regard to whispered voices and some television programs.  They have deferred audiologic testing in the last year.  They do not  have excessive sun exposure. Discussed the need for sun protection: hats, long sleeves and use of sunscreen if there is significant sun exposure.   Diet: the importance of a healthy diet is discussed. They do have a healthy diet.  The benefits of regular aerobic exercise were discussed. She walks 4 times per week ,  20 minutes.   Depression screen: there are no signs or vegative symptoms of depression- irritability, change in appetite, anhedonia, sadness/tearfullness.  Cognitive assessment: the patient manages all their financial and personal affairs and is actively engaged. They could relate day,date,year and events; recalled 2/3 objects at 3 minutes; performed clock-face test normally.  The following portions of the patient's history were  reviewed and updated as appropriate: allergies, current medications, past family history, past medical history,  past surgical history, past social history  and problem list.  Visual acuity was not assessed per patient preference since she has regular follow up with her ophthalmologist. Hearing and body mass index were assessed and reviewed.   During the course of the visit the patient was educated and counseled about appropriate screening and preventive services including : fall prevention , diabetes screening, nutrition counseling, colorectal cancer screening, and recommended immunizations.    CC: The primary encounter diagnosis was Vitamin D deficiency. Diagnoses of Other fatigue, Hyperlipidemia, Prostate cancer screening, and Essential hypertension were also pertinent to this visit.  [name] has a past medical history of GERD (gastroesophageal reflux disease); Low testosterone; Hyperlipidemia; Depression; Hypertension; and Cancer.   He has past surgical history that includes Spine surgery (2012); Hernia repair (Bilateral); and Cosmetic surgery.   His family history includes Early death (age of onset: 86) in his father; Heart disease (age of onset: 45) in his father; Heart disease (age of onset: 26) in his brother; Stroke in his mother.He reports that he has never smoked. He has never used smokeless tobacco. He reports that he drinks alcohol. He reports that he does not use illicit drugs.  Outpatient Prescriptions Prior to Visit  Medication Sig Dispense Refill  . aspirin 81 MG tablet Take 162 mg by mouth daily.     . Coenzyme Q10 (CO Q-10) 100 MG CAPS Take 1 capsule by mouth daily.    Marland Kitchen LACTOBACILLUS  PO Take 1 capsule by mouth daily.    . Multiple Vitamin (MULTIVITAMIN) tablet Take 1 tablet by mouth daily.    . sildenafil (VIAGRA) 100 MG tablet Take 1 tablet (100 mg total) by mouth daily as needed for erectile dysfunction. 18 tablet 3  . doxycycline (VIBRAMYCIN) 100 MG capsule Take 1  capsule (100 mg total) by mouth 2 (two) times daily. 14 capsule 0   No facility-administered medications prior to visit.    Review of Systems   Patient denies headache, fevers, malaise, unintentional weight loss, skin rash, eye pain, sinus congestion and sinus pain, sore throat, dysphagia,  hemoptysis , cough, dyspnea, wheezing, chest pain, palpitations, orthopnea, edema, abdominal pain, nausea, melena, diarrhea, constipation, flank pain, dysuria, hematuria, urinary  Frequency, nocturia, numbness, tingling, seizures,  Focal weakness, Loss of consciousness,  Tremor, insomnia, depression, anxiety, and suicidal ideation.      Objective:  BP 132/78 mmHg  Pulse 70  Temp(Src) 97.9 F (36.6 C) (Oral)  Resp 14  Ht 5\' 11"  (1.803 m)  Wt 203 lb 8 oz (92.307 kg)  BMI 28.40 kg/m2  SpO2 96%  Physical Exam   General appearance: alert, cooperative and appears stated age Ears: normal TM's and external ear canals both ears Throat: lips, mucosa, and tongue normal; teeth and gums normal Neck: no adenopathy, no carotid bruit, supple, symmetrical, trachea midline and thyroid not enlarged, symmetric, no tenderness/mass/nodules Back: symmetric, no curvature. ROM normal. No CVA tenderness. Lungs: clear to auscultation bilaterally Heart: regular rate and rhythm, S1, S2 normal, no murmur, click, rub or gallop Abdomen: soft, non-tender; bowel sounds normal; no masses,  no organomegaly Pulses: 2+ and symmetric Skin: Skin color, texture, turgor normal. No rashes or lesions Lymph nodes: Cervical, supraclavicular, and axillary nodes normal.    Assessment & Plan:   Problem List Items Addressed This Visit      Unprioritized   Hyperlipidemia    Currently managed with diet alone after stopping atorvastatin Dec 2015.  Based on current lipid profile, the risk of clinically significant CAD is18% over the next 10 years, using the Framingham risk calculator. The [employer] recommends starting  patients aged 23 or higher on moderate intensity statin therapy for LDL between 70-189 and 10 yr risk of CAD > 7.5% ;  Will recommend resuming low dose atorvastatin.   Lab Results  Component Value Date   CHOL 241* 06/28/2015   HDL 54.50 06/28/2015   LDLCALC 155* 06/28/2015   LDLDIRECT 138.6 07/03/2014   TRIG 154.0* 06/28/2015   CHOLHDL 4 06/28/2015   BP 132/78 mmHg  Pulse 70  Temp(Src) 97.9 F (36.6 C) (Oral)  Resp 14  Ht 5\' 11"  (1.803 m)  Wt 203 lb 8 oz (92.307 kg)  BMI 28.40 kg/m2  SpO2 96%  Hi      Relevant Orders   Lipid panel (Completed)   HTN (hypertension)    Patient discontinued BP medications after a weight loss of 24 lbs resulted in a reduction in systolic pressure. HOme reaadings have  Been < 130/80 consistently       Other Visit Diagnoses    Vitamin D deficiency    -  Primary    Relevant Orders    Vit D  25 hydroxy (rtn osteoporosis monitoring) (Completed)    Other fatigue        Relevant Orders    CBC with Differential/Platelet (Completed)    Comprehensive metabolic panel (Completed)    TSH (Completed)    Prostate cancer screening  Relevant Orders    PSA (Completed)       I have discontinued Bobby Rich doxycycline. I am also having him maintain his aspirin, Co Q-10, LACTOBACILLUS PO, sildenafil, multivitamin, NON FORMULARY, Vitamin D3, NON FORMULARY, TRIPLE OMEGA-3-6-9, and Potassium.  Meds ordered this encounter  Medications  . NON FORMULARY    Sig: Take 400 mg by mouth daily. DOPA 400 mg seed extract.  . Cholecalciferol (VITAMIN D3) 2000 UNITS TABS    Sig: Take 1 capsule by mouth daily.  . NON FORMULARY    Sig: 400 mg niacin / inositol 100 mg daily  . Omega 3-6-9 Fatty Acids (TRIPLE OMEGA-3-6-9) CAPS    Sig: Take 1 capsule by mouth daily.  . Potassium 99 MG TABS    Sig: Take 1 tablet by mouth daily.    Medications Discontinued During This Encounter  Medication Reason  . doxycycline (VIBRAMYCIN) 100 MG capsule     Follow-up: No  Follow-up on file.   Crecencio Mc, MD

## 2015-06-28 NOTE — Progress Notes (Signed)
Pre-visit discussion using our clinic review tool. No additional management support is needed unless otherwise documented below in the visit note.  

## 2015-06-30 ENCOUNTER — Encounter: Payer: Self-pay | Admitting: Internal Medicine

## 2015-06-30 DIAGNOSIS — E669 Obesity, unspecified: Secondary | ICD-10-CM | POA: Insufficient documentation

## 2015-06-30 NOTE — Assessment & Plan Note (Signed)
Patient discontinued BP medications after a weight loss of 24 lbs resulted in a reduction in systolic pressure. HOme reaadings have  Been < 130/80 consistently

## 2015-06-30 NOTE — Assessment & Plan Note (Signed)

## 2015-06-30 NOTE — Assessment & Plan Note (Signed)
Currently managed with diet alone after stopping atorvastatin Dec 2015.  Based on current lipid profile, the risk of clinically significant CAD is18% over the next 10 years, using the Framingham risk calculator. The [employer] recommends starting patients aged 62 or higher on moderate intensity statin therapy for LDL between 70-189 and 10 yr risk of CAD > 7.5% ;  Will recommend resuming low dose atorvastatin.   Lab Results  Component Value Date   CHOL 241* 06/28/2015   HDL 54.50 06/28/2015   LDLCALC 155* 06/28/2015   LDLDIRECT 138.6 07/03/2014   TRIG 154.0* 06/28/2015   CHOLHDL 4 06/28/2015   BP 132/78 mmHg  Pulse 70  Temp(Src) 97.9 F (36.6 C) (Oral)  Resp 14  Ht 5\' 11"  (1.803 m)  Wt 203 lb 8 oz (92.307 kg)  BMI 28.40 kg/m2  SpO2 96%  Hi

## 2015-06-30 NOTE — Assessment & Plan Note (Signed)
I have congratulated imr in reduction of   BMI and encouraged  Continued weight loss with goal of 10% of body weigh over the next 6 months using a low glycemic index diet and regular exercise a minimum of 5 days per week.

## 2015-10-15 ENCOUNTER — Encounter: Payer: Self-pay | Admitting: Internal Medicine

## 2015-10-15 MED ORDER — CELECOXIB 200 MG PO CAPS: 200.0000 mg | ORAL_CAPSULE | Freq: Two times a day (BID) | ORAL | Status: DC

## 2015-11-07 ENCOUNTER — Other Ambulatory Visit: Payer: Self-pay | Admitting: Internal Medicine

## 2015-11-19 ENCOUNTER — Ambulatory Visit (INDEPENDENT_AMBULATORY_CARE_PROVIDER_SITE_OTHER): Admitting: Podiatry

## 2015-11-19 ENCOUNTER — Encounter: Payer: Self-pay | Admitting: Internal Medicine

## 2015-11-19 ENCOUNTER — Encounter: Payer: Self-pay | Admitting: Podiatry

## 2015-11-19 ENCOUNTER — Ambulatory Visit (INDEPENDENT_AMBULATORY_CARE_PROVIDER_SITE_OTHER)

## 2015-11-19 VITALS — BP 160/82 | HR 63 | Resp 12

## 2015-11-19 DIAGNOSIS — Q828 Other specified congenital malformations of skin: Secondary | ICD-10-CM | POA: Diagnosis not present

## 2015-11-19 DIAGNOSIS — M795 Residual foreign body in soft tissue: Secondary | ICD-10-CM | POA: Diagnosis not present

## 2015-11-19 DIAGNOSIS — L110 Acquired keratosis follicularis: Secondary | ICD-10-CM | POA: Diagnosis not present

## 2015-11-19 MED ORDER — DOXYCYCLINE HYCLATE 100 MG PO TABS: 100.0000 mg | ORAL_TABLET | Freq: Two times a day (BID) | ORAL | Status: DC

## 2015-11-19 NOTE — Telephone Encounter (Signed)
Made an appointment for Mr. Bobby Rich to see Dr. Lacinda Axon at 1:30 pm 11/20/15...tvw

## 2015-11-19 NOTE — Progress Notes (Signed)
   Subjective:    Patient ID: Bobby Rich, male    DOB: [birth year], 64 y.o.   MRN: [id number]  HPI: He presents today for what he thinks is a wart to the lateral aspect of the fifth metatarsophalangeal joint of the right foot. He states that his wife has been looking at it for him and says that looks very much like a wart. He states that over the last few weeks it has become very sore to almost a blistered state and then the top layer has sloughed off. Currently he remains red and sore. He states that as he was tearing of his deck he thought he may be injured it with a splinter going through his shoe but never after solid splinter. States that currently since the top layer has peeled off and does feel much better.  Review of Systems  Skin: Positive for color change.       Objective:   Physical Exam: 64 year old male in no apparent distress vital signs stable with mild hypertension today suggested that he follow-up with primary should this not resolve. Pulses are strongly palpable neurologic sensorium is intact deep tendon reflexes are intact muscle strength is normal orthopedic evaluation of his result to assist ankle full range of motion or crepitation. Cutaneous evaluation demonstrates what appears to be a soft tissue lesion measuring less than 3 mm in diameter but does demonstrate loss of epidermis is very hard to evaluate the lesion. There is surrounding erythema. He states that it may have come about from irritation of his shoe gear. Radiographic evaluation does not demonstrate any type of osseus abnormalities and no foreign bodies. The base of the proximal phalanx appears to be the point of irritation.      Assessment & Plan:  Assessment: Soft tissue lesion lateral aspect fifth metatarsal phalangeal joint.  Plan: With the mild erythema I'm going to put him on doxycycline and recommended he keep it covered for the next day or so. He will watch this and should it return he will notify us  immediately.

## 2015-11-20 ENCOUNTER — Ambulatory Visit (INDEPENDENT_AMBULATORY_CARE_PROVIDER_SITE_OTHER): Admitting: Family Medicine

## 2015-11-20 ENCOUNTER — Encounter: Payer: Self-pay | Admitting: Family Medicine

## 2015-11-20 VITALS — BP 142/88 | HR 72 | Temp 97.5°F | Ht 71.0 in | Wt 214.8 lb

## 2015-11-20 DIAGNOSIS — I10 Essential (primary) hypertension: Secondary | ICD-10-CM | POA: Diagnosis not present

## 2015-11-20 DIAGNOSIS — Z23 Encounter for immunization: Secondary | ICD-10-CM | POA: Diagnosis not present

## 2015-11-20 MED ORDER — CELECOXIB 200 MG PO CAPS: 200.0000 mg | ORAL_CAPSULE | Freq: Two times a day (BID) | ORAL | Status: DC

## 2015-11-20 MED ORDER — HYDROCHLOROTHIAZIDE 25 MG PO TABS: 25.0000 mg | ORAL_TABLET | Freq: Every day | ORAL | Status: DC

## 2015-11-20 NOTE — Assessment & Plan Note (Signed)
Established problem, worsening. After discussion today, patient elected to restart antihypertensive medication. Starting HCTZ 25 mg daily. Follow up in 2 weeks.

## 2015-11-20 NOTE — Progress Notes (Signed)
Pre visit review using our clinic review tool, if applicable. No additional management support is needed unless otherwise documented below in the visit note. 

## 2015-11-20 NOTE — Progress Notes (Signed)
Subjective:  Patient ID: Bobby Rich, male    DOB: December 28, 1950  Age: 64 y.o. MRN: [id number]  CC: elevated BP  HPI:  64 year old male with a past medical history of hypertension presents with complaints of elevated blood pressure.  HTN  Patient reports that he was recently seen by a physician at [facility] regarding his back and his blood pressure was elevated (99991111 systolic).  He has periodically checked his blood pressure since then it has continued to be elevated greater than [id number] systolic.  He is not on any current medications for his high blood pressure as it improved after losing weight.  No associated symptoms: Chest pain, shortness of breath, headache, vision changes.  No known exacerbating factors.  Patient like to discuss medication today.  Social Hx   Social History   Social History  . Marital Status: Married    Spouse Name: N/A  . Number of Children: N/A  . Years of Education: N/A   Social History Main Topics  . Smoking status: Never Smoker   . Smokeless tobacco: Never Used  . Alcohol Use: Yes     Comment: social  . Drug Use: No  . Sexual Activity: Not Asked   Other Topics Concern  . None   Social History Narrative   Review of Systems  Constitutional: Negative.   Eyes: Negative for visual disturbance.  Respiratory: Negative for shortness of breath.   Cardiovascular: Negative for chest pain.   Objective:  BP 142/88 mmHg  Pulse 72  Temp(Src) 97.5 F (36.4 C) (Oral)  Ht 5\' 11"  (1.803 m)  Wt 214 lb 12 oz (97.41 kg)  BMI 29.96 kg/m2  SpO2 96%  BP/Weight 11/20/2015 11/19/2015 [id number]  Systolic BP [id number]  Diastolic BP 88 82 78  Wt. (Lbs) 214.75 - 203.5  BMI 29.96 - 28.4   Physical Exam  Constitutional: He appears well-developed. No distress.  Cardiovascular: Normal rate and regular rhythm.   Pulmonary/Chest: Effort normal and breath sounds normal. No respiratory distress. He has no wheezes. He has no rales.  Neurological: He is alert.   Psychiatric: He has a normal mood and affect.  Vitals reviewed.  Lab Results  Component Value Date   WBC 6.4 06/28/2015   HGB 15.8 06/28/2015   HCT 47.0 06/28/2015   PLT 201.0 06/28/2015   GLUCOSE 95 06/28/2015   CHOL 241* 06/28/2015   TRIG 154.0* 06/28/2015   HDL 54.50 06/28/2015   LDLDIRECT 138.6 07/03/2014   LDLCALC 155* 06/28/2015   ALT 18 06/28/2015   AST 16 06/28/2015   NA 139 06/28/2015   K 4.4 06/28/2015   CL 103 06/28/2015   CREATININE 0.97 06/28/2015   BUN 14 06/28/2015   CO2 29 06/28/2015   TSH 1.61 06/28/2015   PSA 2.90 06/28/2015   Assessment & Plan:   Problem List Items Addressed This Visit    HTN (hypertension) - Primary    Established problem, worsening. After discussion today, patient elected to restart antihypertensive medication. Starting HCTZ 25 mg daily. Follow up in 2 weeks.       Relevant Medications   hydrochlorothiazide (HYDRODIURIL) 25 MG tablet    Other Visit Diagnoses    Encounter for immunization           Meds ordered this encounter  Medications  . DISCONTD: hydrochlorothiazide (HYDRODIURIL) 25 MG tablet    Sig: Take 1 tablet (25 mg total) by mouth daily.    Dispense:  90 tablet    Refill:  3  .  celecoxib (CELEBREX) 200 MG capsule    Sig: Take 1 capsule (200 mg total) by mouth 2 (two) times daily.    Dispense:  180 capsule    Refill:  3  . hydrochlorothiazide (HYDRODIURIL) 25 MG tablet    Sig: Take 1 tablet (25 mg total) by mouth daily.    Dispense:  30 tablet    Refill:  0   Follow-up: 2 weeks.  [place]

## 2015-11-20 NOTE — Patient Instructions (Signed)
Take the HCTZ daily.  Follow up in ~ 2 weeks for a BP check.  Merry Christmas  Dr. Lacinda Axon

## 2015-11-29 ENCOUNTER — Ambulatory Visit: Admitting: Internal Medicine

## 2015-12-17 ENCOUNTER — Encounter: Payer: Self-pay | Admitting: Family Medicine

## 2015-12-17 ENCOUNTER — Other Ambulatory Visit: Payer: Self-pay | Admitting: Family Medicine

## 2015-12-17 DIAGNOSIS — I10 Essential (primary) hypertension: Secondary | ICD-10-CM

## 2015-12-17 MED ORDER — LOSARTAN POTASSIUM-HCTZ 50-12.5 MG PO TABS: 1.0000 | ORAL_TABLET | Freq: Every day | ORAL | Status: DC

## 2016-01-21 ENCOUNTER — Other Ambulatory Visit: Payer: Self-pay | Admitting: Internal Medicine

## 2016-03-31 ENCOUNTER — Other Ambulatory Visit: Payer: Self-pay | Admitting: Internal Medicine

## 2016-06-09 ENCOUNTER — Encounter: Payer: Self-pay | Admitting: Internal Medicine

## 2016-06-09 DIAGNOSIS — I10 Essential (primary) hypertension: Secondary | ICD-10-CM

## 2016-06-09 DIAGNOSIS — E785 Hyperlipidemia, unspecified: Secondary | ICD-10-CM

## 2016-06-09 DIAGNOSIS — N529 Male erectile dysfunction, unspecified: Secondary | ICD-10-CM

## 2016-06-11 ENCOUNTER — Other Ambulatory Visit: Payer: Self-pay | Admitting: Internal Medicine

## 2016-06-30 ENCOUNTER — Other Ambulatory Visit (INDEPENDENT_AMBULATORY_CARE_PROVIDER_SITE_OTHER)

## 2016-06-30 DIAGNOSIS — N529 Male erectile dysfunction, unspecified: Secondary | ICD-10-CM | POA: Diagnosis not present

## 2016-06-30 DIAGNOSIS — E785 Hyperlipidemia, unspecified: Secondary | ICD-10-CM | POA: Diagnosis not present

## 2016-06-30 DIAGNOSIS — I10 Essential (primary) hypertension: Secondary | ICD-10-CM | POA: Diagnosis not present

## 2016-06-30 LAB — CBC WITH DIFFERENTIAL/PLATELET
BASOS PCT: 0.6 % (ref 0.0–3.0)
Basophils Absolute: 0 10*3/uL (ref 0.0–0.1)
EOS PCT: 2.3 % (ref 0.0–5.0)
Eosinophils Absolute: 0.1 10*3/uL (ref 0.0–0.7)
HEMATOCRIT: 39.8 % (ref 39.0–52.0)
HEMOGLOBIN: 13.3 g/dL (ref 13.0–17.0)
Lymphocytes Relative: 28.7 % (ref 12.0–46.0)
Lymphs Abs: 1.8 10*3/uL (ref 0.7–4.0)
MCHC: 33.3 g/dL (ref 30.0–36.0)
MCV: 87.7 fl (ref 78.0–100.0)
MONO ABS: 0.4 10*3/uL (ref 0.1–1.0)
Monocytes Relative: 6.2 % (ref 3.0–12.0)
Neutro Abs: 3.8 10*3/uL (ref 1.4–7.7)
Neutrophils Relative %: 62.2 % (ref 43.0–77.0)
Platelets: 214 10*3/uL (ref 150.0–400.0)
RBC: 4.54 Mil/uL (ref 4.22–5.81)
RDW: 13.8 % (ref 11.5–15.5)
WBC: 6.2 10*3/uL (ref 4.0–10.5)

## 2016-06-30 LAB — LIPID PANEL
CHOL/HDL RATIO: 3
Cholesterol: 183 mg/dL (ref 0–200)
HDL: 54.3 mg/dL (ref >39.00)
LDL CALC: 94 mg/dL (ref 0–99)
NonHDL: 128.66
TRIGLYCERIDES: 173 mg/dL — AB (ref 0.0–149.0)
VLDL: 34.6 mg/dL (ref 0.0–40.0)

## 2016-06-30 LAB — COMPREHENSIVE METABOLIC PANEL
ALBUMIN: 4.2 g/dL (ref 3.5–5.2)
ALT: 28 U/L (ref 0–53)
AST: 20 U/L (ref 0–37)
Alkaline Phosphatase: 70 U/L (ref 39–117)
BUN: 15 mg/dL (ref 6–23)
CHLORIDE: 106 meq/L (ref 96–112)
CO2: 30 meq/L (ref 19–32)
Calcium: 9.6 mg/dL (ref 8.4–10.5)
Creatinine, Ser: 1 mg/dL (ref 0.40–1.50)
GFR: 80.18 mL/min (ref >60.00)
Glucose, Bld: 99 mg/dL (ref 70–99)
POTASSIUM: 4.7 meq/L (ref 3.5–5.1)
SODIUM: 142 meq/L (ref 135–145)
Total Bilirubin: 0.5 mg/dL (ref 0.2–1.2)
Total Protein: 6.6 g/dL (ref 6.0–8.3)

## 2016-06-30 LAB — VITAMIN D 25 HYDROXY (VIT D DEFICIENCY, FRACTURES): VITD: 44.24 ng/mL (ref 30.00–100.00)

## 2016-06-30 LAB — PSA: PSA: 3.05 ng/mL (ref 0.10–4.00)

## 2016-07-03 ENCOUNTER — Encounter: Payer: Self-pay | Admitting: Internal Medicine

## 2016-07-03 DIAGNOSIS — F4329 Adjustment disorder with other symptoms: Secondary | ICD-10-CM | POA: Insufficient documentation

## 2016-07-07 ENCOUNTER — Encounter: Payer: Self-pay | Admitting: Internal Medicine

## 2016-07-11 ENCOUNTER — Encounter: Payer: Self-pay | Admitting: Internal Medicine

## 2016-07-11 ENCOUNTER — Ambulatory Visit (INDEPENDENT_AMBULATORY_CARE_PROVIDER_SITE_OTHER): Admitting: Internal Medicine

## 2016-07-11 VITALS — BP 118/78 | HR 82 | Temp 98.3°F | Ht 71.0 in | Wt 225.0 lb

## 2016-07-11 DIAGNOSIS — K2100 Gastro-esophageal reflux disease with esophagitis, without bleeding: Secondary | ICD-10-CM

## 2016-07-11 DIAGNOSIS — E785 Hyperlipidemia, unspecified: Secondary | ICD-10-CM

## 2016-07-11 DIAGNOSIS — G894 Chronic pain syndrome: Secondary | ICD-10-CM | POA: Diagnosis not present

## 2016-07-11 DIAGNOSIS — R5382 Chronic fatigue, unspecified: Secondary | ICD-10-CM | POA: Diagnosis not present

## 2016-07-11 DIAGNOSIS — Z0000 Encounter for general adult medical examination without abnormal findings: Secondary | ICD-10-CM | POA: Diagnosis not present

## 2016-07-11 DIAGNOSIS — R6882 Decreased libido: Secondary | ICD-10-CM | POA: Diagnosis not present

## 2016-07-11 DIAGNOSIS — I10 Essential (primary) hypertension: Secondary | ICD-10-CM

## 2016-07-11 LAB — VITAMIN B12: Vitamin B-12: 542 pg/mL (ref 211–911)

## 2016-07-11 LAB — TSH: TSH: 1.05 u[IU]/mL (ref 0.35–4.50)

## 2016-07-11 MED ORDER — DULOXETINE HCL 30 MG PO CPEP: 30.0000 mg | ORAL_CAPSULE | Freq: Every day | ORAL | 1 refills | Status: DC

## 2016-07-11 MED ORDER — OMEPRAZOLE 40 MG PO CPDR: 40.0000 mg | DELAYED_RELEASE_CAPSULE | Freq: Every day | ORAL | 0 refills | Status: DC

## 2016-07-11 MED ORDER — SILDENAFIL CITRATE 100 MG PO TABS: 100.0000 mg | ORAL_TABLET | Freq: Every day | ORAL | 3 refills | Status: DC | PRN

## 2016-07-11 NOTE — Patient Instructions (Signed)
Starting Cymbalta for management of depression complicated by chronic pain.  Start with 30 mg daily,  Can increas to 60 mg daily after 2 weeks  If B12 is low, we will have you retur for B12 injections   Flu vaccine available next Wednesday  Consider repeat trial of sleep apnea treatment   Health Maintenance, Male A healthy lifestyle and preventative care can promote health and wellness.  Maintain regular health, dental, and eye exams.  Eat a healthy diet. Foods like vegetables, fruits, whole grains, low-fat dairy products, and lean protein foods contain the nutrients you need and are low in calories. Decrease your intake of foods high in solid fats, added sugars, and salt. Get information about a proper diet from your health care provider, if necessary.  Regular physical exercise is one of the most important things you can do for your health. Most adults should get at least 150 minutes of moderate-intensity exercise (any activity that increases your heart rate and causes you to sweat) each week. In addition, most adults need muscle-strengthening exercises on 2 or more days a week.   Maintain a healthy weight. The body mass index (BMI) is a screening tool to identify possible weight problems. It provides an estimate of body fat based on height and weight. Your health care provider can find your BMI and can help you achieve or maintain a healthy weight. For males 20 years and older:  A BMI below 18.5 is considered underweight.  A BMI of 18.5 to 24.9 is normal.  A BMI of 25 to 29.9 is considered overweight.  A BMI of 30 and above is considered obese.  Maintain normal blood lipids and cholesterol by exercising and minimizing your intake of saturated fat. Eat a balanced diet with plenty of fruits and vegetables. Blood tests for lipids and cholesterol should begin at age 22 and be repeated every 5 years. If your lipid or cholesterol levels are high, you are over age 40, or you are at high risk  for heart disease, you may need your cholesterol levels checked more frequently.Ongoing high lipid and cholesterol levels should be treated with medicines if diet and exercise are not working.  If you smoke, find out from your health care provider how to quit. If you do not use tobacco, do not start.  Lung cancer screening is recommended for adults aged 32-80 years who are at high risk for developing lung cancer because of a history of smoking. A yearly low-dose CT scan of the lungs is recommended for people who have at least a 30-pack-year history of smoking and are current smokers or have quit within the past 15 years. A pack year of smoking is smoking an average of 1 pack of cigarettes a day for 1 year (for example, a 30-pack-year history of smoking could mean smoking 1 pack a day for 30 years or 2 packs a day for 15 years). Yearly screening should continue until the smoker has stopped smoking for at least 15 years. Yearly screening should be stopped for people who develop a health problem that would prevent them from having lung cancer treatment.  If you choose to drink alcohol, do not have more than 2 drinks per day. One drink is considered to be 12 oz (360 mL) of beer, 5 oz (150 mL) of wine, or 1.5 oz (45 mL) of liquor.  Avoid the use of street drugs. Do not share needles with anyone. Ask for help if you need support or instructions about stopping  the use of drugs.  High blood pressure causes heart disease and increases the risk of stroke. High blood pressure is more likely to develop in:  People who have blood pressure in the end of the normal range (100-139/85-89 mm Hg).  People who are overweight or obese.  People who are African American.  If you are 83-47 years of age, have your blood pressure checked every 3-5 years. If you are 63 years of age or older, have your blood pressure checked every year. You should have your blood pressure measured twice--once when you are at a hospital or  clinic, and once when you are not at a hospital or clinic. Record the average of the two measurements. To check your blood pressure when you are not at a hospital or clinic, you can use:  An automated blood pressure machine at a pharmacy.  A home blood pressure monitor.  If you are 35-71 years old, ask your health care provider if you should take aspirin to prevent heart disease.  Diabetes screening involves taking a blood sample to check your fasting blood sugar level. This should be done once every 3 years after age 30 if you are at a normal weight and without risk factors for diabetes. Testing should be considered at a younger age or be carried out more frequently if you are overweight and have at least 1 risk factor for diabetes.  Colorectal cancer can be detected and often prevented. Most routine colorectal cancer screening begins at the age of 54 and continues through age 22. However, your health care provider may recommend screening at an earlier age if you have risk factors for colon cancer. On a yearly basis, your health care provider may provide home test kits to check for hidden blood in the stool. A small camera at the end of a tube may be used to directly examine the colon (sigmoidoscopy or colonoscopy) to detect the earliest forms of colorectal cancer. Talk to your health care provider about this at age 60 when routine screening begins. A direct exam of the colon should be repeated every 5-10 years through age 41, unless early forms of precancerous polyps or small growths are found.  People who are at an increased risk for hepatitis B should be screened for this virus. You are considered at high risk for hepatitis B if:  You were born in a country where hepatitis B occurs often. Talk with your health care provider about which countries are considered high risk.  Your parents were born in a high-risk country and you have not received a shot to protect against hepatitis B (hepatitis B  vaccine).  You have HIV or AIDS.  You use needles to inject street drugs.  You live with, or have sex with, someone who has hepatitis B.  You are a man who has sex with other men (MSM).  You get hemodialysis treatment.  You take certain medicines for conditions like cancer, organ transplantation, and autoimmune conditions.  Hepatitis C blood testing is recommended for all people born from [birth year] through 1963 and any individual with known risk factors for hepatitis C.  Healthy men should no longer receive prostate-specific antigen (PSA) blood tests as part of routine cancer screening. Talk to your health care provider about prostate cancer screening.  Testicular cancer screening is not recommended for adolescents or adult males who have no symptoms. Screening includes self-exam, a health care provider exam, and other screening tests. Consult with your health care provider about any  symptoms you have or any concerns you have about testicular cancer.  Practice safe sex. Use condoms and avoid high-risk sexual practices to reduce the spread of sexually transmitted infections (STIs).  You should be screened for STIs, including gonorrhea and chlamydia if:  You are sexually active and are younger than 24 years.  You are older than 24 years, and your health care provider tells you that you are at risk for this type of infection.  Your sexual activity has changed since you were last screened, and you are at an increased risk for chlamydia or gonorrhea. Ask your health care provider if you are at risk.  If you are at risk of being infected with HIV, it is recommended that you take a prescription medicine daily to prevent HIV infection. This is called pre-exposure prophylaxis (PrEP). You are considered at risk if:  You are a man who has sex with other men (MSM).  You are a heterosexual man who is sexually active with multiple partners.  You take drugs by injection.  You are sexually active  with a partner who has HIV.  Talk with your health care provider about whether you are at high risk of being infected with HIV. If you choose to begin PrEP, you should first be tested for HIV. You should then be tested every 3 months for as long as you are taking PrEP.  Use sunscreen. Apply sunscreen liberally and repeatedly throughout the day. You should seek shade when your shadow is shorter than you. Protect yourself by wearing long sleeves, pants, a wide-brimmed hat, and sunglasses year round whenever you are outdoors.  Tell your health care provider of new moles or changes in moles, especially if there is a change in shape or color. Also, tell your health care provider if a mole is larger than the size of a pencil eraser.  A one-time screening for abdominal aortic aneurysm (AAA) and surgical repair of large AAAs by ultrasound is recommended for men aged 24-75 years who are current or former smokers.  Stay current with your vaccines (immunizations).   This information is not intended to replace advice given to you by your health care provider. Make sure you discuss any questions you have with your health care provider.   Document Released: 05/15/2008 Document Revised: 12/08/2014 Document Reviewed: 04/14/2011 Elsevier Interactive Patient Education [employer].

## 2016-07-11 NOTE — Progress Notes (Signed)
Patient ID: Bobby Rich, male    DOB: 04/16/1951  Age: 65 y.o. MRN: [id number]  The patient is here for annual wellness examination and management of other chronic and acute problems.    Colon ca screening: normal colonoscopy 2013 PSA:  Lab Results  Component Value Date   PSA 3.05 06/30/2016   PSA 2.90 06/28/2015   PSA 2.36 02/14/2014   Lab Results  Component Value Date   CHOL 183 06/30/2016   HDL 54.30 06/30/2016   LDLCALC 94 06/30/2016   LDLDIRECT 138.6 07/03/2014   TRIG 173.0 (H) 06/30/2016   CHOLHDL 3 06/30/2016   Annual skin checks for follow up on [name]  ([facility])    The risk factors are reflected in the social history.  The roster of all physicians providing medical care to patient - is listed in the Snapshot section of the chart.  Home safety : The patient has smoke detectors in the home. They wear seatbelts.  There are no firearms at home. There is no violence in the home.   There is no risks for hepatitis, STDs or HIV. There is no   history of blood transfusion. They have no travel history to infectious disease endemic areas of the world.  The patient has seen their dentist in the last six month. They have seen their eye doctor in the last year.  They do not  have excessive sun exposure. Discussed the need for sun protection: hats, long sleeves and use of sunscreen if there is significant sun exposure.   Diet: the importance of a healthy diet is discussed. They do have a healthy diet.  The benefits of regular aerobic exercise were discussed. She walks 4 times per week ,  20 minutes.   Depression screen: there are no signs or vegative symptoms of depression- irritability, change in appetite, anhedonia, sadness/tearfullness.  The following portions of the patient's history were reviewed and updated as appropriate: allergies, current medications, past family history, past medical history,  past surgical history, past social history  and problem  list.  Visual acuity was not assessed per patient preference since she has regular follow up with her ophthalmologist. Hearing and body mass index were assessed and reviewed.   During the course of the visit the patient was educated and counseled about appropriate screening and preventive services including : fall prevention , diabetes screening, nutrition counseling, colorectal cancer screening, and recommended immunizations.    CC: The primary encounter diagnosis was Loss of libido. Diagnoses of Chronic fatigue, Gastroesophageal reflux disease with esophagitis, Chronic pain syndrome, Essential hypertension, Hyperlipidemia, and Visit for preventive health examination were also pertinent to this visit.  Back issues (chronic) receiving injections for bilateral sciatica by Dr Manson Passey at [facility], stenosis too severe to consider DRG stimulator per Dr Randolm Idol in [place] . Considering surgery .  Pain in right leg is severe at night,  Using gabapentin qhs    Fatigue,  No libido:  Has history of low T ; Used Testim in the past .  Poor quality of sleep due to pain .  Has untreated severe OSA by prior testing in [place] (stopped after a year due to intolerance of mask and no  improvement in fatigue despite improvement in OSA .  This test was done 8 years ago )   GERD: severe, daily , feels like he is burping up battery acid. Not taking PPI or H2 blocker.   [name] has a past medical history of Cancer ([place]); Depression; GERD (gastroesophageal  reflux disease); Hyperlipidemia; Hypertension; and Low testosterone.   He has a past surgical history that includes Spine surgery (2012); Hernia repair (Bilateral); and Cosmetic surgery. Marland Kitchen) He is a mouth breather    His family history includes Early death (age of onset: 100) in his father; Heart disease (age of onset: 23) in his father; Heart disease (age of onset: 83) in his brother; Stroke in his mother.He reports that he has never smoked. He has never used smokeless  tobacco. He reports that he drinks alcohol. He reports that he does not use drugs.  Outpatient Medications Prior to Visit  Medication Sig Dispense Refill  . aspirin 81 MG tablet Take 162 mg by mouth daily.     Marland Kitchen atorvastatin (LIPITOR) 20 MG tablet TAKE 1 TABLET DAILY 90 tablet 0  . celecoxib (CELEBREX) 200 MG capsule Take 1 capsule (200 mg total) by mouth 2 (two) times daily. 180 capsule 3  . Coenzyme Q10 (CO Q-10) 100 MG CAPS Take 1 capsule by mouth daily.    Marland Kitchen gabapentin (NEURONTIN) 300 MG capsule TAKE 1 CAPSULE THREE TIMES A DAY 90 capsule 1  . LACTOBACILLUS PO Take 1 capsule by mouth daily.    Marland Kitchen losartan-hydrochlorothiazide (HYZAAR) 50-12.5 MG tablet Take 1 tablet by mouth daily. 90 tablet 3  . NON FORMULARY Take 400 mg by mouth daily. DOPA 400 mg seed extract.    . NON FORMULARY 400 mg niacin / inositol 100 mg daily    . Omega 3-6-9 Fatty Acids (TRIPLE OMEGA-3-6-9) CAPS Take 1 capsule by mouth daily.    . celecoxib (CELEBREX) 200 MG capsule TAKE 1 CAPSULE TWICE A DAY 180 capsule 0  . sildenafil (VIAGRA) 100 MG tablet Take 1 tablet (100 mg total) by mouth daily as needed for erectile dysfunction. 18 tablet 3  . atorvastatin (LIPITOR) 20 MG tablet Reported on 11/13/2015    . doxycycline (VIBRA-TABS) 100 MG tablet Take 1 tablet (100 mg total) by mouth 2 (two) times daily. (Patient not taking: Reported on 07/11/2016) 20 tablet 0  . losartan-hydrochlorothiazide (HYZAAR) 50-12.5 MG tablet Take 1 tablet by mouth daily. (Patient not taking: Reported on 07/11/2016) 30 tablet 0  . traMADol (ULTRAM) 50 MG tablet TK 1 T PO  Q 8 H PRN P  2   No facility-administered medications prior to visit.     Review of Systems   Patient denies headache, fevers, malaise, unintentional weight loss, skin rash, eye pain, sinus congestion and sinus pain, sore throat, dysphagia,  hemoptysis , cough, dyspnea, wheezing, chest pain, palpitations, orthopnea, edema, abdominal pain, nausea, melena, diarrhea, constipation,  flank pain, dysuria, hematuria, urinary  Frequency, nocturia, numbness, tingling, seizures,  Focal weakness, Loss of consciousness,  Tremor, insomnia, depression, anxiety, and suicidal ideation.      Objective:  BP 118/78 (BP Location: Left Arm, Patient Position: Sitting, Cuff Size: Normal)   Pulse 82   Temp 98.3 F (36.8 C) (Oral)   Ht 5\' 11"  (1.803 m)   Wt 225 lb (102.1 kg)   SpO2 96%   BMI 31.38 kg/m   Physical Exam   General appearance: alert, cooperative and appears stated age Ears: normal TM's and external ear canals both ears Throat: lips, mucosa, and tongue normal; teeth and gums normal Neck: no adenopathy, no carotid bruit, supple, symmetrical, trachea midline and thyroid not enlarged, symmetric, no tenderness/mass/nodules Back: symmetric, no curvature. ROM normal. No CVA tenderness. Lungs: clear to auscultation bilaterally Heart: regular rate and rhythm, S1, S2 normal, no murmur, click, rub  or gallop Abdomen: soft, non-tender; bowel sounds normal; no masses,  no organomegaly Pulses: 2+ and symmetric Skin: Skin color, texture, turgor normal. No rashes or lesions Lymph nodes: Cervical, supraclavicular, and axillary nodes normal.    Assessment & Plan:   Problem List Items Addressed This Visit    GERD (gastroesophageal reflux disease)    Advised to resume PPI for 2 weeks,  Then downgrade to H2 blocker.      Relevant Medications   omeprazole (PRILOSEC) 40 MG capsule   Hyperlipidemia    Currently managed with atorvastatin.   LFTs normal.   Lab Results  Component Value Date   CHOL 183 06/30/2016   HDL 54.30 06/30/2016   LDLCALC 94 06/30/2016   LDLDIRECT 138.6 07/03/2014   TRIG 173.0 (H) 06/30/2016   CHOLHDL 3 06/30/2016   Lab Results  Component Value Date   ALT 28 06/30/2016   AST 20 06/30/2016   ALKPHOS 70 06/30/2016   BILITOT 0.5 06/30/2016         Relevant Medications   sildenafil (VIAGRA) 100 MG tablet   Visit for preventive health examination     Annual comprehensive preventive exam was done as well as an evaluation and management of acute and chronic conditions .  During the course of the visit the patient was educated and counseled about appropriate screening and preventive services including :  diabetes screening, lipid analysis with projected  10 year  risk for CAD , nutrition counseling, prostate and colorectal cancer screening, and recommended immunizations.  Printed recommendations for health maintenance screenings was given.   Lab Results  Component Value Date   PSA 3.05 06/30/2016   PSA 2.90 06/28/2015   PSA 2.36 02/14/2014         HTN (hypertension)    Well controlled on current regimen. Renal function stable, no changes today.  Lab Results  Component Value Date   CREATININE 1.00 06/30/2016   Lab Results  Component Value Date   NA 142 06/30/2016   K 4.7 06/30/2016   CL 106 06/30/2016   CO2 30 06/30/2016         Relevant Medications   sildenafil (VIAGRA) 100 MG tablet   Loss of libido - Primary    Multifactorial,  With history of untreated OSA.  history of low testosterone , and chronic pain . Recommended repeating sleep study,  checking thyroid testosterone and b12 levels.   Lab Results  Component Value Date   TSH 1.05 07/11/2016   Lab Results  Component Value Date   [id number] 07/11/2016   Lab Results  Component Value Date   TESTOSTERONE 497 07/03/2014        Relevant Orders   Testosterone Total,Free,Bio, Males   Chronic fatigue    Screened for deficiencies, negative. chronic pain and untreated OSA .  Lab Results  Component Value Date   [id number] 07/11/2016   Lab Results  Component Value Date   TSH 1.05 07/11/2016   Lab Results  Component Value Date   WBC 6.2 06/30/2016   HGB 13.3 06/30/2016   HCT 39.8 06/30/2016   MCV 87.7 06/30/2016   PLT 214.0 06/30/2016         Relevant Orders   Vitamin B12 (Completed)   Methylmalonic Acid   TSH (Completed)   Testosterone  Total,Free,Bio, Males   Chronic pain syndrome    Secondary to persistent back pain secondary to spinal stenosis.  Trial of cymbalta       Other Visit Diagnoses  None.     I have discontinued Mr. Collard traMADol and doxycycline. I am also having him start on omeprazole and DULoxetine. Additionally, I am having him maintain his aspirin, Co Q-10, LACTOBACILLUS PO, NON FORMULARY, NON FORMULARY, TRIPLE OMEGA-3-6-9, gabapentin, celecoxib, losartan-hydrochlorothiazide, atorvastatin, and sildenafil.  Meds ordered this encounter  Medications  . omeprazole (PRILOSEC) 40 MG capsule    Sig: Take 1 capsule (40 mg total) by mouth daily.    Dispense:  90 capsule    Refill:  0  . sildenafil (VIAGRA) 100 MG tablet    Sig: Take 1 tablet (100 mg total) by mouth daily as needed for erectile dysfunction.    Dispense:  18 tablet    Refill:  3  . DULoxetine (CYMBALTA) 30 MG capsule    Sig: Take 1 capsule (30 mg total) by mouth daily.    Dispense:  90 capsule    Refill:  1    Medications Discontinued During This Encounter  Medication Reason  . atorvastatin (LIPITOR) 20 MG tablet Error  . celecoxib (CELEBREX) [id number] MG capsule Duplicate  . doxycycline (VIBRA-TABS) 100 MG tablet Completed Course  . losartan-hydrochlorothiazide (HYZAAR) [id number] MG tablet Duplicate  . traMADol (ULTRAM) 50 MG tablet Completed Course  . sildenafil (VIAGRA) 100 MG tablet Reorder    Follow-up: Return in about 6 weeks (around 08/22/2016).   Crecencio Mc, MD

## 2016-07-13 ENCOUNTER — Encounter: Payer: Self-pay | Admitting: Internal Medicine

## 2016-07-13 DIAGNOSIS — G4733 Obstructive sleep apnea (adult) (pediatric): Secondary | ICD-10-CM | POA: Insufficient documentation

## 2016-07-13 DIAGNOSIS — R6882 Decreased libido: Secondary | ICD-10-CM | POA: Insufficient documentation

## 2016-07-13 DIAGNOSIS — G894 Chronic pain syndrome: Secondary | ICD-10-CM | POA: Insufficient documentation

## 2016-07-13 NOTE — Assessment & Plan Note (Signed)
Well controlled on current regimen. Renal function stable, no changes today.  Lab Results  Component Value Date   CREATININE 1.00 06/30/2016   Lab Results  Component Value Date   NA 142 06/30/2016   K 4.7 06/30/2016   CL 106 06/30/2016   CO2 30 06/30/2016

## 2016-07-13 NOTE — Assessment & Plan Note (Signed)
Advised to resume PPI for 2 weeks,  Then downgrade to H2 blocker.

## 2016-07-13 NOTE — Assessment & Plan Note (Signed)
Screened for deficiencies, negative. chronic pain and untreated OSA .  Lab Results  Component Value Date   [id number] 07/11/2016   Lab Results  Component Value Date   TSH 1.05 07/11/2016   Lab Results  Component Value Date   WBC 6.2 06/30/2016   HGB 13.3 06/30/2016   HCT 39.8 06/30/2016   MCV 87.7 06/30/2016   PLT 214.0 06/30/2016

## 2016-07-13 NOTE — Assessment & Plan Note (Signed)
Annual comprehensive preventive exam was done as well as an evaluation and management of acute and chronic conditions .  During the course of the visit the patient was educated and counseled about appropriate screening and preventive services including :  diabetes screening, lipid analysis with projected  10 year  risk for CAD , nutrition counseling, prostate and colorectal cancer screening, and recommended immunizations.  Printed recommendations for health maintenance screenings was given.   Lab Results  Component Value Date   PSA 3.05 06/30/2016   PSA 2.90 06/28/2015   PSA 2.36 02/14/2014

## 2016-07-13 NOTE — Assessment & Plan Note (Signed)
Secondary to persistent back pain secondary to spinal stenosis.  Trial of cymbalta

## 2016-07-13 NOTE — Assessment & Plan Note (Addendum)
Currently managed with atorvastatin.   LFTs normal.   Lab Results  Component Value Date   CHOL 183 06/30/2016   HDL 54.30 06/30/2016   LDLCALC 94 06/30/2016   LDLDIRECT 138.6 07/03/2014   TRIG 173.0 (H) 06/30/2016   CHOLHDL 3 06/30/2016   Lab Results  Component Value Date   ALT 28 06/30/2016   AST 20 06/30/2016   ALKPHOS 70 06/30/2016   BILITOT 0.5 06/30/2016

## 2016-07-13 NOTE — Assessment & Plan Note (Addendum)
Multifactorial,  With history of untreated OSA.  history of low testosterone , and chronic pain . Recommended repeating sleep study,  checking thyroid testosterone and b12 levels.   Lab Results  Component Value Date   TSH 1.05 07/11/2016   Lab Results  Component Value Date   [id number] 07/11/2016   Lab Results  Component Value Date   TESTOSTERONE 497 07/03/2014

## 2016-07-14 LAB — TESTOSTERONE TOTAL,FREE,BIO, MALES
ALBUMIN: 4.4 g/dL (ref 3.6–5.1)
SEX HORMONE BINDING: 32 nmol/L (ref 22–77)
TESTOSTERONE BIOAVAILABLE: 51.5 ng/dL — AB (ref 130.5–681.7)
TESTOSTERONE FREE: 25.6 pg/mL — AB (ref 47.0–244.0)
TESTOSTERONE: 202 ng/dL — AB (ref 250–827)

## 2016-07-15 LAB — METHYLMALONIC ACID, SERUM: METHYLMALONIC ACID, QUANT: 196 nmol/L (ref 87–318)

## 2016-07-16 ENCOUNTER — Encounter: Payer: Self-pay | Admitting: Internal Medicine

## 2016-07-17 ENCOUNTER — Other Ambulatory Visit: Payer: Self-pay | Admitting: Internal Medicine

## 2016-07-18 ENCOUNTER — Other Ambulatory Visit: Payer: Self-pay | Admitting: Internal Medicine

## 2016-07-18 ENCOUNTER — Telehealth: Payer: Self-pay | Admitting: *Deleted

## 2016-07-18 MED ORDER — ATORVASTATIN CALCIUM 20 MG PO TABS: 20.0000 mg | ORAL_TABLET | Freq: Every day | ORAL | 3 refills | Status: DC

## 2016-07-18 MED ORDER — TESTOSTERONE CYPIONATE 200 MG/ML IM SOLN: 100.0000 mg | INTRAMUSCULAR | 0 refills | Status: DC

## 2016-07-18 NOTE — Telephone Encounter (Signed)
Patient aware of testosterone injections and is scheduling appointment

## 2016-07-18 NOTE — Telephone Encounter (Signed)
-----   Message from Crecencio Mc, MD sent at 07/18/2016  1:09 PM EDT ----- Regarding: testosterone Patient needs to set up biweekly testosterone injections here at the office.  (every 2 weeks ) the rx has been been ordered for the medication and will be faxed to his pharmacy today

## 2016-07-18 NOTE — Telephone Encounter (Signed)
Left message for patient to call office.  

## 2016-07-18 NOTE — Telephone Encounter (Signed)
Patient called and states he is  returning [place] call. He is requesting  a call back [phone]. Thanks

## 2016-07-22 ENCOUNTER — Ambulatory Visit (INDEPENDENT_AMBULATORY_CARE_PROVIDER_SITE_OTHER)

## 2016-07-22 DIAGNOSIS — E291 Testicular hypofunction: Secondary | ICD-10-CM

## 2016-07-22 DIAGNOSIS — Z23 Encounter for immunization: Secondary | ICD-10-CM

## 2016-07-22 DIAGNOSIS — R7989 Other specified abnormal findings of blood chemistry: Secondary | ICD-10-CM

## 2016-07-22 MED ORDER — TESTOSTERONE CYPIONATE 200 MG/ML IM SOLN
100.0000 mg | Freq: Once | INTRAMUSCULAR | Status: AC
  Administered 2016-07-22: 100 mg via INTRAMUSCULAR

## 2016-07-22 NOTE — Progress Notes (Signed)
Patient came in for testosterone injection, brought medication with him.  Received in right upper outer quadrant.  Patient tolerated well.   Patient also received a flu shot, see chart.   Dr. Lacinda Axon Please advise as Dr. Derrel Nip is out of the office. thanks

## 2016-07-22 NOTE — Progress Notes (Signed)
I agree with the management as indicated in the note.  Azzie Thiem DO  

## 2016-08-01 HISTORY — PX: LUMBAR LAMINECTOMY: SHX95

## 2016-08-05 ENCOUNTER — Ambulatory Visit (INDEPENDENT_AMBULATORY_CARE_PROVIDER_SITE_OTHER)

## 2016-08-05 DIAGNOSIS — E291 Testicular hypofunction: Secondary | ICD-10-CM

## 2016-08-05 DIAGNOSIS — R7989 Other specified abnormal findings of blood chemistry: Secondary | ICD-10-CM

## 2016-08-05 MED ORDER — TESTOSTERONE CYPIONATE 200 MG/ML IM SOLN
100.0000 mg | Freq: Once | INTRAMUSCULAR | Status: AC
  Administered 2016-08-05: 100 mg via INTRAMUSCULAR

## 2016-08-05 NOTE — Progress Notes (Signed)
Pt was in today getting a testosterone injection in the Left upper quadrant. Pt brought his own medication. Pt tolerated well. See patient e-mail from 07/16/16.

## 2016-08-05 NOTE — Progress Notes (Signed)
I have reviewed and agree with the above note and the email chain from 07/16/16.  Tommi Rumps, M.D.

## 2016-08-13 ENCOUNTER — Telehealth: Payer: Self-pay | Admitting: Internal Medicine

## 2016-08-13 NOTE — Telephone Encounter (Signed)
This was checked last on 07/11/16, Please advise if it will be checked tomorrow, thanks

## 2016-08-13 NOTE — Telephone Encounter (Signed)
Pt is coming in to see Dr. Derrel Nip tomorrow, he was wondering if he could have his testosterone level checked at the lab.

## 2016-08-13 NOTE — Telephone Encounter (Signed)
Spoke with the patient, reviewed note, thanks

## 2016-08-13 NOTE — Telephone Encounter (Signed)
No, will address tomorrow at visit.

## 2016-08-14 ENCOUNTER — Encounter: Payer: Self-pay | Admitting: Internal Medicine

## 2016-08-14 ENCOUNTER — Ambulatory Visit (INDEPENDENT_AMBULATORY_CARE_PROVIDER_SITE_OTHER): Admitting: Internal Medicine

## 2016-08-14 VITALS — BP 118/70 | HR 101 | Temp 98.0°F | Resp 16 | Ht 71.0 in | Wt 229.2 lb

## 2016-08-14 DIAGNOSIS — Z79899 Other long term (current) drug therapy: Secondary | ICD-10-CM | POA: Diagnosis not present

## 2016-08-14 DIAGNOSIS — E291 Testicular hypofunction: Secondary | ICD-10-CM | POA: Diagnosis not present

## 2016-08-14 DIAGNOSIS — R7989 Other specified abnormal findings of blood chemistry: Secondary | ICD-10-CM

## 2016-08-14 DIAGNOSIS — M5417 Radiculopathy, lumbosacral region: Secondary | ICD-10-CM

## 2016-08-14 DIAGNOSIS — E785 Hyperlipidemia, unspecified: Secondary | ICD-10-CM | POA: Diagnosis not present

## 2016-08-14 DIAGNOSIS — R5382 Chronic fatigue, unspecified: Secondary | ICD-10-CM | POA: Diagnosis not present

## 2016-08-14 NOTE — Patient Instructions (Addendum)
Please Return on Monday for 3rd testosterone injection.  We will repeat your level after 6 weeks of therapy,  Midway between your biweekly injections, so we will Repeat your testosterone level, (CBC and  Fasting lipids on the  following Monday)

## 2016-08-14 NOTE — Progress Notes (Signed)
Subjective:  Patient ID: Bobby Rich, male    DOB: [birth year]  Age: 65 y.o. MRN: [id number]  CC: The primary encounter diagnosis was Low testosterone. Diagnoses of Long-term use of high-risk medication, Chronic fatigue, Hyperlipidemia, and L-S radiculopathy were also pertinent to this visit.  HPI  Bobby Rich presents for  Follow up on hyperlipidemia, hypertension, OA,  Depression, obesity and hypogonadism previously treated with Fortesta gel. Now receiving testosterone supplementation with bi  weekly injections. His Testosterone level was 202 on August 11 . His First injection was August 22,  Last one was sept  5th, Discussed repeating his level midway between next 2 injections around the 28th of September  Energy level  Has not improved as much as he anticipated since starting testosterone.  Reports fatigue and morning sedation , but he is taking neurontin And cymbalta 30 mg at nigh for chronic back pain secondary to L5 radiculopathy.  He has also noticed a decreased urinary stream and some hesitation since starting cymbalta.   Has gained 4 lbs since last visit.  15 since December BP ok   Anxiety:  His planned back Surgery is tomorrow. At [facility],  Same day. microdisckectomy  At L4-5 disk space  Planned By [facility].     Outpatient Medications Prior to Visit  Medication Sig Dispense Refill  . aspirin 81 MG tablet Take 81 mg by mouth daily.     Marland Kitchen atorvastatin (LIPITOR) 20 MG tablet Take 1 tablet (20 mg total) by mouth daily. 90 tablet 3  . celecoxib (CELEBREX) 200 MG capsule Take 1 capsule (200 mg total) by mouth 2 (two) times daily. (Patient taking differently: Take 200 mg by mouth once as needed. ) 180 capsule 3  . Coenzyme Q10 (CO Q-10) 100 MG CAPS Take 1 capsule by mouth daily.    . DULoxetine (CYMBALTA) 30 MG capsule Take 1 capsule (30 mg total) by mouth daily. 90 capsule 1  . gabapentin (NEURONTIN) 300 MG capsule TAKE 1 CAPSULE THREE TIMES A DAY 90 capsule 1  . LACTOBACILLUS PO  Take 1 capsule by mouth daily.    Marland Kitchen losartan-hydrochlorothiazide (HYZAAR) 50-12.5 MG tablet Take 1 tablet by mouth daily. 90 tablet 3  . Omega 3-6-9 Fatty Acids (TRIPLE OMEGA-3-6-9) CAPS Take 1 capsule by mouth daily.    Marland Kitchen omeprazole (PRILOSEC) 40 MG capsule Take 1 capsule (40 mg total) by mouth daily. 90 capsule 0  . sildenafil (VIAGRA) 100 MG tablet Take 1 tablet (100 mg total) by mouth daily as needed for erectile dysfunction. 18 tablet 3  . testosterone cypionate (DEPOTESTOSTERONE CYPIONATE) 200 MG/ML injection Inject 0.5 mLs (100 mg total) into the muscle every 14 (fourteen) days. 10 mL 0  . NON FORMULARY Take 400 mg by mouth daily. DOPA 400 mg seed extract.    . NON FORMULARY 400 mg niacin / inositol 100 mg daily     No facility-administered medications prior to visit.     Review of Systems;  Patient denies headache, fevers, malaise, unintentional weight loss, skin rash, eye pain, sinus congestion and sinus pain, sore throat, dysphagia,  hemoptysis , cough, dyspnea, wheezing, chest pain, palpitations, orthopnea, edema, abdominal pain, nausea, melena, diarrhea, constipation, flank pain, dysuria, hematuria, urinary  Frequency, nocturia, numbness, tingling, seizures,  Focal weakness, Loss of consciousness,  Tremor, insomnia, depression, anxiety, and suicidal ideation.      Objective:  BP 118/70 (BP Location: Right Arm, Patient Position: Sitting, Cuff Size: Large)   Pulse (!) 101   Temp 98  F (36.7 C) (Oral)   Resp 16   Ht 5\' 11"  (1.803 m)   Wt 229 lb 3.2 oz (104 kg)   SpO2 95%   BMI 31.97 kg/m   BP Readings from Last 3 Encounters:  08/14/16 118/70  07/11/16 118/78  11/20/15 (!) 142/88    Wt Readings from Last 3 Encounters:  08/14/16 229 lb 3.2 oz (104 kg)  07/11/16 225 lb (102.1 kg)  11/20/15 214 lb 12 oz (97.4 kg)    General appearance: alert, cooperative and appears stated age Ears: normal TM's and external ear canals both ears Throat: lips, mucosa, and tongue normal;  teeth and gums normal Neck: no adenopathy, no carotid bruit, supple, symmetrical, trachea midline and thyroid not enlarged, symmetric, no tenderness/mass/nodules Back: symmetric, no curvature. ROM normal. No CVA tenderness. Lungs: clear to auscultation bilaterally Heart: regular rate and rhythm, S1, S2 normal, no murmur, click, rub or gallop Abdomen: soft, non-tender; bowel sounds normal; no masses,  no organomegaly Pulses: 2+ and symmetric Skin: Skin color, texture, turgor normal. No rashes or lesions Lymph nodes: Cervical, supraclavicular, and axillary nodes normal.  No results found for: HGBA1C  Lab Results  Component Value Date   CREATININE 1.00 06/30/2016   CREATININE 0.97 06/28/2015   CREATININE 1.3 07/03/2014    Lab Results  Component Value Date   WBC 6.2 06/30/2016   HGB 13.3 06/30/2016   HCT 39.8 06/30/2016   PLT 214.0 06/30/2016   GLUCOSE 99 06/30/2016   CHOL 183 06/30/2016   TRIG 173.0 (H) 06/30/2016   HDL 54.30 06/30/2016   LDLDIRECT 138.6 07/03/2014   LDLCALC 94 06/30/2016   ALT 28 06/30/2016   AST 20 06/30/2016   NA 142 06/30/2016   K 4.7 06/30/2016   CL 106 06/30/2016   CREATININE 1.00 06/30/2016   BUN 15 06/30/2016   CO2 30 06/30/2016   TSH 1.05 07/11/2016   PSA 3.05 06/30/2016       Assessment & Plan:   Problem List Items Addressed This Visit    Low testosterone - Primary    Previously treated with Fortesta gel, now with biweekly testosterone injections .  Asked to return inearly October for repeat level.       Relevant Orders   Testosterone, Free, Total, SHBG   Hyperlipidemia    Currently managed with atorvastatin.   LFTs normal.   Lab Results  Component Value Date   CHOL 183 06/30/2016   HDL 54.30 06/30/2016   LDLCALC 94 06/30/2016   LDLDIRECT 138.6 07/03/2014   TRIG 173.0 (H) 06/30/2016   CHOLHDL 3 06/30/2016   Lab Results  Component Value Date   ALT 28 06/30/2016   AST 20 06/30/2016   ALKPHOS 70 06/30/2016   BILITOT 0.5  06/30/2016         L-S radiculopathy    Stable- Seen at [facility] by Dr. Macario Carls. Recent CT guided ESIs done at L3-4 through [facility]. Were transiently helpful.  For microsurgery tomorrow. .       Chronic fatigue    Medications and chronic back pain contributing.    Lab Results  Component Value Date   [id number] 07/11/2016   Lab Results  Component Value Date   TSH 1.05 07/11/2016   Lab Results  Component Value Date   WBC 6.2 06/30/2016   HGB 13.3 06/30/2016   HCT 39.8 06/30/2016   MCV 87.7 06/30/2016   PLT 214.0 06/30/2016          Other Visit Diagnoses  Long-term use of high-risk medication       Relevant Orders   Lipid panel   Comprehensive metabolic panel   CBC with Differential/Platelet      I have discontinued Mr. Damuth NON FORMULARY and NON FORMULARY. I am also having him maintain his aspirin, Co Q-10, LACTOBACILLUS PO, TRIPLE OMEGA-3-6-9, gabapentin, celecoxib, losartan-hydrochlorothiazide, omeprazole, sildenafil, DULoxetine, atorvastatin, and testosterone cypionate.  No orders of the defined types were placed in this encounter.   Medications Discontinued During This Encounter  Medication Reason  . NON FORMULARY Patient Discharge  . NON FORMULARY Patient Discharge    Follow-up: Return in about 4 days (around 08/18/2016), or RN visit for injection,  fasting labs  setp  25.   Darianne Muralles, Aris Everts, MD

## 2016-08-16 NOTE — Assessment & Plan Note (Signed)
Stable- Seen at [facility] by Dr. Macario Carls. Recent CT guided ESIs done at L3-4 through [facility]. Were transiently helpful.  For microsurgery tomorrow. Marland Kitchen

## 2016-08-16 NOTE — Assessment & Plan Note (Signed)
Previously treated with Fortesta gel, now with biweekly testosterone injections .  Asked to return inearly October for repeat level.

## 2016-08-16 NOTE — Assessment & Plan Note (Signed)
Medications and chronic back pain contributing.    Lab Results  Component Value Date   [id number] 07/11/2016   Lab Results  Component Value Date   TSH 1.05 07/11/2016   Lab Results  Component Value Date   WBC 6.2 06/30/2016   HGB 13.3 06/30/2016   HCT 39.8 06/30/2016   MCV 87.7 06/30/2016   PLT 214.0 06/30/2016

## 2016-08-16 NOTE — Assessment & Plan Note (Signed)
Currently managed with atorvastatin.   LFTs normal.   Lab Results  Component Value Date   CHOL 183 06/30/2016   HDL 54.30 06/30/2016   LDLCALC 94 06/30/2016   LDLDIRECT 138.6 07/03/2014   TRIG 173.0 (H) 06/30/2016   CHOLHDL 3 06/30/2016   Lab Results  Component Value Date   ALT 28 06/30/2016   AST 20 06/30/2016   ALKPHOS 70 06/30/2016   BILITOT 0.5 06/30/2016

## 2016-08-20 ENCOUNTER — Ambulatory Visit (INDEPENDENT_AMBULATORY_CARE_PROVIDER_SITE_OTHER)

## 2016-08-20 DIAGNOSIS — E291 Testicular hypofunction: Secondary | ICD-10-CM

## 2016-08-20 DIAGNOSIS — R7989 Other specified abnormal findings of blood chemistry: Secondary | ICD-10-CM

## 2016-08-20 MED ORDER — TESTOSTERONE CYPIONATE 200 MG/ML IM SOLN
100.0000 mg | Freq: Once | INTRAMUSCULAR | Status: AC
  Administered 2016-08-20: 100 mg via INTRAMUSCULAR

## 2016-08-20 NOTE — Progress Notes (Signed)
Patient came in for Testosterone injection, medication brought from home.  Received in Right upper outer Quadrant.  Patient tolerated well. Ordersin OV on 08/14/2016.

## 2016-08-28 ENCOUNTER — Ambulatory Visit: Admitting: Internal Medicine

## 2016-08-29 ENCOUNTER — Other Ambulatory Visit (INDEPENDENT_AMBULATORY_CARE_PROVIDER_SITE_OTHER)

## 2016-08-29 DIAGNOSIS — E291 Testicular hypofunction: Secondary | ICD-10-CM

## 2016-08-29 DIAGNOSIS — Z79899 Other long term (current) drug therapy: Secondary | ICD-10-CM | POA: Diagnosis not present

## 2016-08-29 DIAGNOSIS — R7989 Other specified abnormal findings of blood chemistry: Secondary | ICD-10-CM

## 2016-08-29 LAB — COMPREHENSIVE METABOLIC PANEL
ALK PHOS: 77 U/L (ref 39–117)
ALT: 23 U/L (ref 0–53)
AST: 18 U/L (ref 0–37)
Albumin: 4.2 g/dL (ref 3.5–5.2)
BILIRUBIN TOTAL: 0.5 mg/dL (ref 0.2–1.2)
BUN: 13 mg/dL (ref 6–23)
CO2: 32 mEq/L (ref 19–32)
Calcium: 9.6 mg/dL (ref 8.4–10.5)
Chloride: 102 mEq/L (ref 96–112)
Creatinine, Ser: 1.01 mg/dL (ref 0.40–1.50)
GFR: 79.23 mL/min (ref >60.00)
GLUCOSE: 77 mg/dL (ref 70–99)
Potassium: 4.4 mEq/L (ref 3.5–5.1)
SODIUM: 140 meq/L (ref 135–145)
TOTAL PROTEIN: 7.4 g/dL (ref 6.0–8.3)

## 2016-08-29 LAB — CBC WITH DIFFERENTIAL/PLATELET
Basophils Absolute: 0 10*3/uL (ref 0.0–0.1)
Basophils Relative: 0.3 % (ref 0.0–3.0)
EOS ABS: 0.1 10*3/uL (ref 0.0–0.7)
EOS PCT: 0.8 % (ref 0.0–5.0)
HEMATOCRIT: 44.6 % (ref 39.0–52.0)
HEMOGLOBIN: 14.9 g/dL (ref 13.0–17.0)
Lymphocytes Relative: 24.4 % (ref 12.0–46.0)
Lymphs Abs: 2.4 10*3/uL (ref 0.7–4.0)
MCHC: 33.4 g/dL (ref 30.0–36.0)
MCV: 86.1 fl (ref 78.0–100.0)
Monocytes Absolute: 0.6 10*3/uL (ref 0.1–1.0)
Monocytes Relative: 5.9 % (ref 3.0–12.0)
Neutro Abs: 6.6 10*3/uL (ref 1.4–7.7)
Neutrophils Relative %: 68.6 % (ref 43.0–77.0)
PLATELETS: 238 10*3/uL (ref 150.0–400.0)
RBC: 5.18 Mil/uL (ref 4.22–5.81)
RDW: 14.3 % (ref 11.5–15.5)
WBC: 9.6 10*3/uL (ref 4.0–10.5)

## 2016-08-29 LAB — LIPID PANEL
Cholesterol: 171 mg/dL (ref 0–200)
HDL: 51.9 mg/dL (ref >39.00)
LDL Cholesterol: 89 mg/dL (ref 0–99)
NONHDL: 118.63
Total CHOL/HDL Ratio: 3
Triglycerides: 150 mg/dL — ABNORMAL HIGH (ref 0.0–149.0)
VLDL: 30 mg/dL (ref 0.0–40.0)

## 2016-08-29 NOTE — Addendum Note (Signed)
Addended by: Frutoso Chase A on: 08/29/2016 10:23 AM   Modules accepted: Orders

## 2016-09-01 LAB — TESTOS,TOTAL,FREE AND SHBG (FEMALE)
SEX HORMONE BINDING GLOB.: 20 nmol/L — AB (ref 22–77)
TESTOSTERONE,FREE: 53.5 pg/mL (ref 35.0–155.0)
TESTOSTERONE,TOTAL,LC/MS/MS: 332 ng/dL (ref 250–1100)

## 2016-09-03 ENCOUNTER — Encounter: Payer: Self-pay | Admitting: Internal Medicine

## 2016-09-23 ENCOUNTER — Ambulatory Visit (INDEPENDENT_AMBULATORY_CARE_PROVIDER_SITE_OTHER)

## 2016-09-23 DIAGNOSIS — E349 Endocrine disorder, unspecified: Secondary | ICD-10-CM

## 2016-09-23 MED ORDER — TESTOSTERONE CYPIONATE 100 MG/ML IM SOLN
100.0000 mg | Freq: Once | INTRAMUSCULAR | Status: AC
  Administered 2016-09-23: 100 mg via INTRAMUSCULAR

## 2016-09-23 NOTE — Progress Notes (Addendum)
Patient presents for testosterone injection injected in left upper quadrant patient tolerated procedure well.    Reviewed. Dr Nicki Reaper

## 2016-10-08 ENCOUNTER — Ambulatory Visit (INDEPENDENT_AMBULATORY_CARE_PROVIDER_SITE_OTHER)

## 2016-10-08 DIAGNOSIS — E349 Endocrine disorder, unspecified: Secondary | ICD-10-CM | POA: Diagnosis not present

## 2016-10-08 MED ORDER — TESTOSTERONE CYPIONATE 200 MG/ML IM SOLN
100.0000 mg | Freq: Once | INTRAMUSCULAR | Status: AC
  Administered 2016-10-08: 100 mg via INTRAMUSCULAR

## 2016-10-08 NOTE — Progress Notes (Signed)
Patient came in for testosterone injection, received in Right upper outer quadrant. Patient tolerated well.  Patient brought home supply.

## 2016-10-09 ENCOUNTER — Ambulatory Visit

## 2016-10-12 NOTE — Progress Notes (Signed)
  I have reviewed the above information and agree with above.   Amela Handley, MD 

## 2016-10-15 ENCOUNTER — Ambulatory Visit (INDEPENDENT_AMBULATORY_CARE_PROVIDER_SITE_OTHER): Admitting: Family

## 2016-10-15 ENCOUNTER — Other Ambulatory Visit: Payer: Self-pay | Admitting: Internal Medicine

## 2016-10-15 ENCOUNTER — Encounter: Payer: Self-pay | Admitting: Family

## 2016-10-15 VITALS — BP 136/76 | HR 95 | Temp 98.2°F | Ht 71.0 in | Wt 233.0 lb

## 2016-10-15 DIAGNOSIS — M62838 Other muscle spasm: Secondary | ICD-10-CM

## 2016-10-15 MED ORDER — TIZANIDINE HCL 2 MG PO CAPS: 2.0000 mg | ORAL_CAPSULE | Freq: Every day | ORAL | 0 refills | Status: DC | PRN

## 2016-10-15 NOTE — Telephone Encounter (Signed)
Please advise 

## 2016-10-15 NOTE — Telephone Encounter (Signed)
Pt states that he forgot to ask for a refill on  cyclobenzaprine (FLEXERIL) 5 MG tablet when he was here earlier sent to [place] [phone]  Please advise

## 2016-10-15 NOTE — Progress Notes (Signed)
Subjective:    Patient ID: Bobby Rich, male    DOB: Jun 26, 1951, 65 y.o.   MRN: [id number]  CC: Bobby Rich is a 65 y.o. male who presents today for an acute visit.    HPI: Patient here for acute visit with chief complaint of back pain 1 week; onset was on the floor bending over putting together a crib. Worsening. Rich weeks ago had spinal laminectomy for bilateral nerve impingement and started PT. Had f/u with back surgeon who started on flexeril 5mg  or diclofenac. Flexeril works well during night. Pain worse during in the day. Twisting movement aggravate back. Heat improves pain.       HISTORY:  Past Medical History:  Diagnosis Date  . Cancer ([place])    basel cell skin cancer  . Depression   . GERD (gastroesophageal reflux disease)   . Hyperlipidemia   . Hypertension   . Low testosterone    Past Surgical History:  Procedure Laterality Date  . COSMETIC SURGERY    . HERNIA REPAIR Bilateral    with mesh   . SPINE SURGERY  2012   Bobby Rich    Family History  Problem Relation Age of Onset  . Stroke Mother   . Early death Father 55  . Heart disease Father 49    [facility]I   . Heart disease Brother 66    [facility]I    Allergies: Patient has no known allergies. Current Outpatient Prescriptions on File Prior to Visit  Medication Sig Dispense Refill  . aspirin 81 MG tablet Take 81 mg by mouth daily.     Marland Kitchen atorvastatin (LIPITOR) 20 MG tablet Take 1 tablet (20 mg total) by mouth daily. 90 tablet 3  . celecoxib (CELEBREX) 200 MG capsule Take 1 capsule (200 mg total) by mouth 2 (two) times daily. (Patient taking differently: Take 200 mg by mouth once as needed. ) 180 capsule 3  . Coenzyme Q10 (CO Q-10) 100 MG CAPS Take 1 capsule by mouth daily.    . DULoxetine (CYMBALTA) 30 MG capsule Take 1 capsule (30 mg total) by mouth daily. 90 capsule 1  . LACTOBACILLUS PO Take 1 capsule by mouth daily.    Marland Kitchen losartan-hydrochlorothiazide (HYZAAR) 50-12.Rich MG tablet Take 1 tablet by mouth  daily. 90 tablet 3  . Omega 3-6-9 Fatty Acids (TRIPLE OMEGA-3-6-9) CAPS Take 1 capsule by mouth daily.    Marland Kitchen omeprazole (PRILOSEC) 40 MG capsule Take 1 capsule (40 mg total) by mouth daily. 90 capsule 0  . sildenafil (VIAGRA) 100 MG tablet Take 1 tablet (100 mg total) by mouth daily as needed for erectile dysfunction. 18 tablet 3  . testosterone cypionate (DEPOTESTOSTERONE CYPIONATE) 200 MG/ML injection Inject 0.Rich mLs (100 mg total) into the muscle every 14 (fourteen) days. 10 mL 0   No current facility-administered medications on file prior to visit.     Social History  Substance Use Topics  . Smoking status: Never Smoker  . Smokeless tobacco: Never Used  . Alcohol use Yes     Comment: social    Review of Systems  Constitutional: Negative for chills and fever.  Respiratory: Negative for cough.   Cardiovascular: Negative for chest pain and palpitations.  Gastrointestinal: Negative for abdominal pain, nausea and vomiting.  Genitourinary: Negative for dysuria and frequency.  Musculoskeletal: Positive for back pain.      Objective:    BP 136/76   Pulse 95   Temp 98.2 F (36.8 C) (Oral)   Ht  Rich\' 11"  (1.803 m)   Wt 233 lb (105.7 kg)   SpO2 98%   BMI 32.50 kg/m    Physical Exam  Constitutional: He appears well-developed and well-nourished.  Cardiovascular: Regular rhythm and normal heart sounds.   Pulmonary/Chest: Effort normal and breath sounds normal. No respiratory distress. He has no wheezes. He has no rhonchi. He has no rales.  Musculoskeletal:       Lumbar back: He exhibits spasm. He exhibits normal range of motion, no tenderness, no swelling and no pain.       Back:       Arms: Full range of motion with flexion, extension, lateral side bends. No pain, numbness, tingling elicited with single leg raise bilaterally. No rash. generalized spasm bilateral signs of back as noted on diagram.  Well-healing  laceration is marked on diagram. Well approximated, no erythema or  purulent discharge.  Neurological: He is alert.  Skin: Skin is warm and dry.  Psychiatric: He has a normal mood and affect. His speech is normal and behavior is normal.  Vitals reviewed.      Assessment & Plan:   1. Muscle spasm Symptoms consistent with low back spasm which occurred when putting crib together. Patient needs something during the daytime and we discussed options of prednisone and tramadol. Contraindications due to Cymbalta for tramadol and patient did not want to take prednisone. We jointly agreed that a low dose of a typically less sedating muscle relaxant PRN during day was reasonable. Return precautions given.                       - tizanidine (ZANAFLEX) 2 MG capsule; Take 1 capsule (2 mg total) by mouth daily as needed for muscle spasms.  Dispense: 15 capsule; Refill: 0    I have discontinued [name] gabapentin. I am also having him start on tizanidine. Additionally, I am having him maintain his aspirin, Co Q-10, LACTOBACILLUS PO, TRIPLE OMEGA-3-6-9, celecoxib, losartan-hydrochlorothiazide, omeprazole, sildenafil, DULoxetine, atorvastatin, testosterone cypionate, cyclobenzaprine, and diclofenac.   Meds ordered this encounter  Medications  . cyclobenzaprine (FLEXERIL) Rich MG tablet    Sig: Take by mouth.  . diclofenac (VOLTAREN) 75 MG EC tablet    Sig: Take by mouth.  . tizanidine (ZANAFLEX) 2 MG capsule    Sig: Take 1 capsule (2 mg total) by mouth daily as needed for muscle spasms.    Dispense:  15 capsule    Refill:  0    Order Specific Question:   Supervising Provider    Answer:   Bobby Rich [[id number]]    Return precautions given.   Risks, benefits, and alternatives of the medications and treatment plan prescribed today were discussed, and patient expressed understanding.   Education regarding symptom management and diagnosis given to patient on AVS.  Continue to follow with Bobby Rich for routine health maintenance.   Bobby Rich and  I agreed with plan.   Bobby Paris, FNP

## 2016-10-15 NOTE — Progress Notes (Signed)
Pre visit review using our clinic review tool, if applicable. No additional management support is needed unless otherwise documented below in the visit note. 

## 2016-10-15 NOTE — Patient Instructions (Signed)
Trial of zanaflex during the day as needed.   Take the flexeril at bedtime as needed.  Do not drive or operate heavy machinery while on muscle relaxant. Please do not drink alcohol. Only take this medication as needed for acute muscle spasm at bedtime. This medication make you feel drowsy so be very careful.  Stop taking if become too drowsy or somnolent as this puts you at risk for falls. Please contact our office with any questions.    Muscle Cramps and Spasms Muscle cramps and spasms occur when a muscle or muscles tighten and you have no control over this tightening (involuntary muscle contraction). They are a common problem and can develop in any muscle. The most common place is in the calf muscles of the leg. Both muscle cramps and muscle spasms are involuntary muscle contractions, but they also have differences:   Muscle cramps are sporadic and painful. They may last a few seconds to a quarter of an hour. Muscle cramps are often more forceful and last longer than muscle spasms.  Muscle spasms may or may not be painful. They may also last just a few seconds or much longer. CAUSES  It is uncommon for cramps or spasms to be due to a serious underlying problem. In many cases, the cause of cramps or spasms is unknown. Some common causes are:   Overexertion.   Overuse from repetitive motions (doing the same thing over and over).   Remaining in a certain position for a long period of time.   Improper preparation, form, or technique while performing a sport or activity.   Dehydration.   Injury.   Side effects of some medicines.   Abnormally low levels of the salts and ions in your blood (electrolytes), especially potassium and calcium. This could happen if you are taking water pills (diuretics) or you are pregnant.  Some underlying medical problems can make it more likely to develop cramps or spasms. These include, but are not limited to:   Diabetes.   Parkinson disease.    Hormone disorders, such as thyroid problems.   Alcohol abuse.   Diseases specific to muscles, joints, and bones.   Blood vessel disease where not enough blood is getting to the muscles.  HOME CARE INSTRUCTIONS   Stay well hydrated. Drink enough water and fluids to keep your urine clear or pale yellow.  It may be helpful to massage, stretch, and relax the affected muscle.  For tight or tense muscles, use a warm towel, heating pad, or hot shower water directed to the affected area.  If you are sore or have pain after a cramp or spasm, applying ice to the affected area may relieve discomfort.  Put ice in a plastic bag.  Place a towel between your skin and the bag.  Leave the ice on for 15-20 minutes, 3-4 times a day.  Medicines used to treat a known cause of cramps or spasms may help reduce their frequency or severity. Only take over-the-counter or prescription medicines as directed by your caregiver. SEEK MEDICAL CARE IF:  Your cramps or spasms get more severe, more frequent, or do not improve over time.  MAKE SURE YOU:   Understand these instructions.  Will watch your condition.  Will get help right away if you are not doing well or get worse. This information is not intended to replace advice given to you by your health care provider. Make sure you discuss any questions you have with your health care provider. Document Released:  05/09/2002 Document Revised: 03/14/2013 Document Reviewed: 08/21/2015 Elsevier Interactive Patient Education  2017 [employer].

## 2016-10-16 ENCOUNTER — Other Ambulatory Visit: Payer: Self-pay | Admitting: Internal Medicine

## 2016-10-16 MED ORDER — CYCLOBENZAPRINE HCL 5 MG PO TABS: 5.0000 mg | ORAL_TABLET | Freq: Every evening | ORAL | 0 refills | Status: AC | PRN

## 2016-10-16 NOTE — Telephone Encounter (Signed)
Sent to [facility]

## 2016-10-17 ENCOUNTER — Ambulatory Visit

## 2016-10-27 ENCOUNTER — Telehealth: Payer: Self-pay | Admitting: Internal Medicine

## 2016-10-27 NOTE — Telephone Encounter (Signed)
Patient has been informed.  He stated he understood and would comply.

## 2016-10-27 NOTE — Telephone Encounter (Signed)
Call pt-  He had recent surgery in September and I would like for him to call his surgeon FIRST. I am concerned it is surgery related.   Would he call his neurosurgeon Dr Lowella Dandy first?

## 2016-10-27 NOTE — Telephone Encounter (Signed)
Pt called and stated that his back is not any better. He would like to have an xray or MRI of his back asap. He is in [place] now and will return on Friday. Please advise

## 2016-10-27 NOTE — Telephone Encounter (Signed)
Please advise 

## 2016-12-08 ENCOUNTER — Other Ambulatory Visit: Payer: Self-pay | Admitting: Family Medicine

## 2016-12-11 ENCOUNTER — Other Ambulatory Visit: Payer: Self-pay | Admitting: Internal Medicine

## 2016-12-22 ENCOUNTER — Encounter: Payer: Self-pay | Admitting: Family Medicine

## 2016-12-22 ENCOUNTER — Ambulatory Visit (INDEPENDENT_AMBULATORY_CARE_PROVIDER_SITE_OTHER): Admitting: Family Medicine

## 2016-12-22 ENCOUNTER — Other Ambulatory Visit: Payer: Self-pay | Admitting: Internal Medicine

## 2016-12-22 VITALS — BP 144/83 | HR 96 | Temp 97.5°F | Resp 14 | Wt 219.6 lb

## 2016-12-22 DIAGNOSIS — M79674 Pain in right toe(s): Secondary | ICD-10-CM | POA: Insufficient documentation

## 2016-12-22 DIAGNOSIS — M79675 Pain in left toe(s): Secondary | ICD-10-CM | POA: Diagnosis not present

## 2016-12-22 NOTE — Progress Notes (Signed)
Pre visit review using our clinic review tool, if applicable. No additional management support is needed unless otherwise documented below in the visit note. 

## 2016-12-22 NOTE — Assessment & Plan Note (Signed)
New problem. Uncertain etiology and prognosis at this time. He was informed that this was gout. This is not acute gout. No evidence of paronychia or other underlying infection. I am unsure of what is causing the redness as well as the pain. Sending to podiatry.

## 2016-12-22 NOTE — Progress Notes (Signed)
   Subjective:  Patient ID: Bobby Rich, male    DOB: 1950-12-11  Age: 66 y.o. MRN: [id number]  CC: Toe redness, mild discomfort  HPI:  66 year old male presents with the above complaint.  Patient reports a two-week history of redness around the nail bed and the tip of the first and second toes bilaterally. Slightly tender to palpation. No reports of joint pain. No known inciting factor. No known relieving factors. No evidence of abscess or drainage. No reports of discoloration toenail. No known exacerbating factors. No other associated symptoms. No other complaints or concerns this time.   Social Hx   Social History   Social History  . Marital status: Married    Spouse name: N/A  . Number of children: N/A  . Years of education: N/A   Social History Main Topics  . Smoking status: Never Smoker  . Smokeless tobacco: Never Used  . Alcohol use Yes     Comment: social  . Drug use: No  . Sexual activity: Yes   Other Topics Concern  . None   Social History Narrative  . None    Review of Systems  Constitutional: Negative.   Skin:       Redness of the tips (and around the nailbed) of the 1st and 2nd toes bilaterally   Objective:  BP (!) 144/83   Pulse 96   Temp 97.5 F (36.4 C) (Oral)   Resp 14   Wt 219 lb 9.6 oz (99.6 kg)   SpO2 98%   BMI 30.63 kg/m   BP/Weight 12/22/2016 10/15/2016 [id number]  Systolic BP [id number] [id number]  Diastolic BP 83 76 70  Wt. (Lbs) 219.6 233 229.2  BMI 30.63 32.5 31.97   Physical Exam  Constitutional: He is oriented to person, place, and time. He appears well-developed. No distress.  Pulmonary/Chest: Effort normal.  Musculoskeletal:  No swelling of the joints of the foot/toes. Normal range of motion.  Neurological: He is alert and oriented to person, place, and time.  Skin:  1st and 2nd toes bilaterally - mild peri-ungual erythema. Tips of the toes slightly erythematous and tender to palpation as well.  Psychiatric: He has a normal mood  and affect.  Vitals reviewed.  Lab Results  Component Value Date   WBC 9.6 08/29/2016   HGB 14.9 08/29/2016   HCT 44.6 08/29/2016   PLT 238.0 08/29/2016   GLUCOSE 77 08/29/2016   CHOL 171 08/29/2016   TRIG 150.0 (H) 08/29/2016   HDL 51.90 08/29/2016   LDLDIRECT 138.6 07/03/2014   LDLCALC 89 08/29/2016   ALT 23 08/29/2016   AST 18 08/29/2016   NA 140 08/29/2016   K 4.4 08/29/2016   CL 102 08/29/2016   CREATININE 1.01 08/29/2016   BUN 13 08/29/2016   CO2 32 08/29/2016   TSH 1.05 07/11/2016   PSA 3.05 06/30/2016    Assessment & Plan:   Problem List Items Addressed This Visit    Toe pain, bilateral - Primary    New problem. Uncertain etiology and prognosis at this time. He was informed that this was gout. This is not acute gout. No evidence of paronychia or other underlying infection. I am unsure of what is causing the redness as well as the pain. Sending to podiatry.      Relevant Orders   Ambulatory referral to Podiatry     Follow-up: PRN  [facility]

## 2016-12-22 NOTE — Patient Instructions (Signed)
We will call with the referral.  Change your footwear.  Nothing to worry about acutely at this point.  Take care  Dr. Lacinda Axon

## 2017-01-01 ENCOUNTER — Ambulatory Visit (INDEPENDENT_AMBULATORY_CARE_PROVIDER_SITE_OTHER)

## 2017-01-01 ENCOUNTER — Encounter: Payer: Self-pay | Admitting: Podiatry

## 2017-01-01 ENCOUNTER — Ambulatory Visit (INDEPENDENT_AMBULATORY_CARE_PROVIDER_SITE_OTHER): Admitting: Podiatry

## 2017-01-01 VITALS — Resp 16

## 2017-01-01 DIAGNOSIS — M799 Soft tissue disorder, unspecified: Secondary | ICD-10-CM

## 2017-01-01 DIAGNOSIS — M79674 Pain in right toe(s): Secondary | ICD-10-CM

## 2017-01-01 DIAGNOSIS — M79675 Pain in left toe(s): Secondary | ICD-10-CM | POA: Diagnosis not present

## 2017-01-01 DIAGNOSIS — M2041 Other hammer toe(s) (acquired), right foot: Secondary | ICD-10-CM

## 2017-01-01 DIAGNOSIS — M2042 Other hammer toe(s) (acquired), left foot: Secondary | ICD-10-CM

## 2017-01-01 DIAGNOSIS — M7989 Other specified soft tissue disorders: Secondary | ICD-10-CM

## 2017-01-01 DIAGNOSIS — L600 Ingrowing nail: Secondary | ICD-10-CM

## 2017-01-01 DIAGNOSIS — S90229A Contusion of unspecified lesser toe(s) with damage to nail, initial encounter: Secondary | ICD-10-CM

## 2017-01-01 MED ORDER — DEXAMETHASONE SODIUM PHOSPHATE 120 MG/30ML IJ SOLN
4.0000 mg | Freq: Once | INTRAMUSCULAR | Status: AC
  Administered 2017-01-01: 4 mg via INTRA_ARTICULAR

## 2017-01-01 NOTE — Progress Notes (Signed)
Subjective:    Patient ID: Bobby Rich, male    DOB: [birth year], 66 y.o.   MRN: [id number]  HPI 66 year old male presents the office today for multiple concerns. He states that he has a knot on the bottom of his right fifth toe which is been painful. He states he is seen Dr. Milinda Pointer for this. He states that the area was never diagnosed that he gets occasional pain to the area. He feels that he is walking on a knot. He states that both of his big toes as well as the second toes have been bruised the last 2-3 weeks. He denies any recent injury or trauma that he has noticed some mild redness the tip of his right second toe. Denies any drainage or swelling or any warmth. Denies any drainage from the toenail. He did recently change his shoes as his shoes may be too small. He has no other complaints today. He said no recent treatment.   Review of Systems  All other systems reviewed and are negative.      Objective:   Physical Exam General: AAO x3, NAD  Dermatological: There is what appears to be bruising to the distal aspects of bilateral hallux and second digit toes. There is faint erythema to the distal portion of the second toe on the right foot. Erythema appears to worsen when he puts weight to his foot and the toe contractures. There is a claw toe type deformity to the right second toe and the erythema worsens with pressure. There is no pain to the toenail. There is minimal incurvation of the right 2nd digit toenail along the medial nail border. There is no drainage or pus expressed and there is no warmth or edema.  Vascular: Dorsalis Pedis artery and Posterior Tibial artery pedal pulses are 2/4 bilateral with immedate capillary fill time. Pedal hair growth present. No varicosities and no lower extremity edema present bilateral. There is no pain with calf compression, swelling, warmth, erythema.   Neruologic: Grossly intact via light touch bilateral. Vibratory intact via tuning fork  bilateral. Protective threshold with Semmes Wienstein monofilament intact to all pedal sites bilateral.   Musculoskeletal: Flexion contractures present the toes. On the plantar aspect of the right fifth MTPJ is what appears to be a small mobile firm soft tissue mass present. There is mild tenderness to palpation of the area. There is no overlying edema, erythema, increase in warmth. No other areas of tenderness identified bilaterally. Muscular strength 5/5 in all groups tested bilateral.  Gait: Unassisted, Nonantalgic.      Assessment & Plan:  66 year old male soft tissue mass right plantar fifth MPJ, toe bruising/erythema -Treatment options discussed including all alternatives, risks, and complications -Etiology of symptoms were discussed -X-rays were obtained and reviewed with the patient. There is no evidence of acute fracture.  -I recommended change in shoe gear and offloading pads were also dispensed. He was measured for his shoe size today. He by shoes so they are about a thumbs with on the big toe however the second toe is longer toes are recommended him to tried changing shoes to see if this will help. I also debrided the right second digit toenail removed a small portion ingrown toenail. There is no pus or other signs of infection present in the debridement. -Discussed soft tissue mass right foot. Discussed a steroid injection into the mass and he wishes to proceed with this. Under sterile conditions a mixture of dexamethasone and local anesthetic was infiltrated without complications.  Post injection care was discussed. Offloading pads dispensed.  -RTC 3 weeks  Celesta Gentile, DPM

## 2017-01-12 ENCOUNTER — Encounter: Payer: Self-pay | Admitting: Internal Medicine

## 2017-01-12 DIAGNOSIS — H9312 Tinnitus, left ear: Secondary | ICD-10-CM

## 2017-01-13 ENCOUNTER — Other Ambulatory Visit: Payer: Self-pay | Admitting: Internal Medicine

## 2017-01-13 DIAGNOSIS — M4626 Osteomyelitis of vertebra, lumbar region: Secondary | ICD-10-CM | POA: Insufficient documentation

## 2017-01-13 DIAGNOSIS — M4620 Osteomyelitis of vertebra, site unspecified: Secondary | ICD-10-CM

## 2017-01-13 DIAGNOSIS — H9312 Tinnitus, left ear: Secondary | ICD-10-CM | POA: Insufficient documentation

## 2017-01-19 DIAGNOSIS — M532X6 Spinal instabilities, lumbar region: Secondary | ICD-10-CM | POA: Insufficient documentation

## 2017-01-22 ENCOUNTER — Ambulatory Visit: Admitting: Podiatry

## 2017-01-27 ENCOUNTER — Ambulatory Visit: Admitting: Podiatry

## 2017-02-04 ENCOUNTER — Encounter: Payer: Self-pay | Admitting: Internal Medicine

## 2017-02-04 ENCOUNTER — Ambulatory Visit (INDEPENDENT_AMBULATORY_CARE_PROVIDER_SITE_OTHER)

## 2017-02-04 ENCOUNTER — Ambulatory Visit (INDEPENDENT_AMBULATORY_CARE_PROVIDER_SITE_OTHER): Admitting: Internal Medicine

## 2017-02-04 VITALS — BP 110/70 | HR 89 | Temp 97.9°F | Resp 16 | Wt 215.0 lb

## 2017-02-04 DIAGNOSIS — M79651 Pain in right thigh: Secondary | ICD-10-CM | POA: Diagnosis not present

## 2017-02-04 DIAGNOSIS — G894 Chronic pain syndrome: Secondary | ICD-10-CM

## 2017-02-04 DIAGNOSIS — M25551 Pain in right hip: Secondary | ICD-10-CM | POA: Diagnosis not present

## 2017-02-04 DIAGNOSIS — M5417 Radiculopathy, lumbosacral region: Secondary | ICD-10-CM | POA: Diagnosis not present

## 2017-02-04 DIAGNOSIS — H9312 Tinnitus, left ear: Secondary | ICD-10-CM | POA: Diagnosis not present

## 2017-02-04 MED ORDER — TRAMADOL HCL 50 MG PO TABS: 50.0000 mg | ORAL_TABLET | Freq: Three times a day (TID) | ORAL | 1 refills | Status: DC | PRN

## 2017-02-04 NOTE — Progress Notes (Signed)
Pre visit review using our clinic review tool, if applicable. No additional management support is needed unless otherwise documented below in the visit note. 

## 2017-02-04 NOTE — Progress Notes (Signed)
Subjective:  Patient ID: Bobby Rich, male    DOB: 1951-03-09  Age: 66 y.o. MRN: [id number]  CC: The primary encounter diagnosis was Pain of right hip joint. Diagnoses of Chronic pain syndrome, L-S radiculopathy, Thigh pain, musculoskeletal, right, and Tinnitus aurium, left were also pertinent to this visit.  HPI VAUGHAN GARFINKLE presents for   1) evaluation of painful lump on right thigh that has been present for 2 years he has no known  history of trauma or injection  To that area and has developed increased concern since the area has become tender to palpation and  Is disrupting his sleep since he can no longer sleep on his back due to problem #2.   2) Diskitis/osteomyelitis:  Treated at [place] ,  [place] op complication from decompressive surgery in September  completed 6 weeks of iv ceftriaxone at the end of January   ESR and CRP were normal feb 2018 .   3) Tinnitus,  ENT referral was requested feb 20.  Remote history of tinnitus that has been recurrent since his mid to late 20's, but always lasted  < 1 minute.  Previous episode described as a ringing in his ears, currently the symptoms is "white noise/hissing" and has been  Present constantly for several weeks  Reviewed medications:  He is using celebrex  less than daily.  Has been taking Cymbalta daily (prescribed by [facility]) since December for chronic pain.   Outpatient Medications Prior to Visit  Medication Sig Dispense Refill  . aspirin 81 MG tablet Take 81 mg by mouth daily.     Marland Kitchen atorvastatin (LIPITOR) 20 MG tablet Take 1 tablet (20 mg total) by mouth daily. 90 tablet 3  . celecoxib (CELEBREX) 200 MG capsule Take 1 capsule (200 mg total) by mouth 2 (two) times daily. (Patient taking differently: Take 200 mg by mouth once as needed. ) 180 capsule 3  . Coenzyme Q10 (CO Q-10) 100 MG CAPS Take 1 capsule by mouth daily.    . DULoxetine (CYMBALTA) 30 MG capsule TAKE 1 CAPSULE DAILY 90 capsule 0  . LACTOBACILLUS PO Take 1 capsule by mouth  daily.    Marland Kitchen losartan-hydrochlorothiazide (HYZAAR) 50-12.5 MG tablet TAKE 1 TABLET DAILY 90 tablet 3  . Omega 3-6-9 Fatty Acids (TRIPLE OMEGA-3-6-9) CAPS Take 1 capsule by mouth daily.    Marland Kitchen omeprazole (PRILOSEC) 40 MG capsule TAKE 1 CAPSULE DAILY 90 capsule 0  . sildenafil (VIAGRA) 100 MG tablet Take 1 tablet (100 mg total) by mouth daily as needed for erectile dysfunction. 18 tablet 3  . testosterone cypionate (DEPOTESTOSTERONE CYPIONATE) 200 MG/ML injection Inject 0.5 mLs (100 mg total) into the muscle every 14 (fourteen) days. 10 mL 0   No facility-administered medications prior to visit.     Review of Systems;  Patient denies headache, fevers, malaise, unintentional weight loss, skin rash, eye pain, sinus congestion and sinus pain, sore throat, dysphagia,  hemoptysis , cough, dyspnea, wheezing, chest pain, palpitations, orthopnea, edema, abdominal pain, nausea, melena, diarrhea, constipation, flank pain, dysuria, hematuria, urinary  Frequency, nocturia, numbness, tingling, seizures,  Focal weakness, Loss of consciousness,  Tremor, insomnia, depression, anxiety, and suicidal ideation.      Objective:  BP 110/70   Pulse 89   Temp 97.9 F (36.6 C) (Oral)   Resp 16   Wt 215 lb (97.5 kg)   SpO2 99%   BMI 29.99 kg/m   BP Readings from Last 3 Encounters:  02/04/17 110/70  12/22/16 (!) 144/83  10/15/16 136/76    Wt Readings from Last 3 Encounters:  02/04/17 215 lb (97.5 kg)  12/22/16 219 lb 9.6 oz (99.6 kg)  10/15/16 233 lb (105.7 kg)    General appearance: alert, cooperative and appears stated age Ears: normal TM's and external ear canals both ears Throat: lips, mucosa, and tongue normal; teeth and gums normal Neck: no adenopathy, no carotid bruit, supple, symmetrical, trachea midline and thyroid not enlarged, symmetric, no tenderness/mass/nodules Back:  no tenderness to midline palpation, no palpable spasm.  back wound well healed with 3 in midline incision Lungs: clear to  auscultation bilaterally Heart: regular rate and rhythm, S1, S2 normal, no murmur, click, rub or gallop Abdomen: soft, non-tender; bowel sounds normal; no masses,  no organomegaly Pulses: 2+ and symmetric MSK/Skin:   Right lateral thigh with solid tender mass non mobile. No erythema,  rashes or lesions Lymph nodes: Cervical, supraclavicular, and axillary nodes normal.  No results found for: HGBA1C  Lab Results  Component Value Date   CREATININE 1.01 08/29/2016   CREATININE 1.00 06/30/2016   CREATININE 0.97 06/28/2015    Lab Results  Component Value Date   WBC 9.6 08/29/2016   HGB 14.9 08/29/2016   HCT 44.6 08/29/2016   PLT 238.0 08/29/2016   GLUCOSE 77 08/29/2016   CHOL 171 08/29/2016   TRIG 150.0 (H) 08/29/2016   HDL 51.90 08/29/2016   LDLDIRECT 138.6 07/03/2014   LDLCALC 89 08/29/2016   ALT 23 08/29/2016   AST 18 08/29/2016   NA 140 08/29/2016   K 4.4 08/29/2016   CL 102 08/29/2016   CREATININE 1.01 08/29/2016   BUN 13 08/29/2016   CO2 32 08/29/2016   TSH 1.05 07/11/2016   PSA 3.05 06/30/2016    [facility] W/o Cm DATA:  Low back pain with right leg pain back surgery 2012 EXAM: MRI LUMBAR SPINE WITHOUT CONTRAST TECHNIQUE: Multiplanar, multisequence MR imaging of the lumbar spine was performed. No intravenous contrast was administered. COMPARISON:  Lumbar MRI 10/10/2011 FINDINGS: Transitional lumbosacral anatomy. No radiographs available for comparison purposes. L5 is partially incorporated into the sacrum and is a transitional vertebra. 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 Lumbar vertebral level assignment is consistent with the prior study. Negative for fracture or mass. Conus medullaris terminates at mid T12 and shows no focal lesion. T12-L1:  Negative L1-2:  Negative L2-3: Disc degeneration and spondylosis which has progressed in the interval. Bilateral facet hypertrophy also has progressed. There is  moderate spinal stenosis which has progressed in the interval. Lateral recess and foraminal stenosis bilaterally. L3-4: Disc degeneration and spondylosis with bilateral facet hypertrophy. Improvement in central disc protrusion since the prior study. Mild spinal stenosis. L4-5: Disc degeneration and spondylosis. Mild facet hypertrophy. Mild right foraminal encroachment. Question laminotomy on the right. 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 No significant stenosis. IMPRESSION: Transitional lumbosacral anatomy. Lumbar level assignment is consistent with the prior study. 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 Progression of disc degeneration and spondylosis and facet degeneration at L2-3. Moderate spinal stenosis has progressed. Improvement in central disc protrusion at L3-4. There remains mild spinal stenosis due to spondylitic change. Mild spondylosis L4-5 is unchanged from the prior study Electronically Signed   By: Franchot Gallo M.D.   On: 04/19/2014 09:08     Assessment & Plan:   Problem List Items Addressed  This Visit    Chronic pain syndrome    Secondary to persistent back pain secondary to spinal stenosis.  Managed with  cymbalta      L-S radiculopathy    He underwent LAMINECTOMY POSTERIOR LUMBAR &/DECOMPRESSION L3-L5  At [facility] in Sept 2017 with complete resolution of pain for 4 weeks, followed by progressive return of pain secondary to osteomyelitis      Thigh pain, musculoskeletal, right    h has 2 calcified masses in right lateral thigh that have been asymptomatic for 2 years, but now problematic due to change  In sleeping position that is the result of hi lumbar spine surgery he has no known history of trauma or injection to/in this area,  Recommended Orthopedics referral for possible biopsy       Tinnitus aurium, left    Persistent, etiology unclear, nota reported s/e of ceftriazone or  cymbalta. Referred   For ENT evaluation        Other Visit Diagnoses    Pain of right hip joint    -  Primary   Relevant Orders   DG HIP UNILAT WITH PELVIS 2-3 VIEWS RIGHT (Completed)      I am having Mr. Ponds start on traMADol. I am also having him maintain his aspirin, Co Q-10, LACTOBACILLUS PO, TRIPLE OMEGA-3-6-9, celecoxib, sildenafil, atorvastatin, testosterone cypionate, losartan-hydrochlorothiazide, DULoxetine, and omeprazole.  Meds ordered this encounter  Medications  . traMADol (ULTRAM) 50 MG tablet    Sig: Take 1 tablet (50 mg total) by mouth every 8 (eight) hours as needed.    Dispense:  60 tablet    Refill:  1    There are no discontinued medications.  Follow-up: No Follow-up on file.   Crecencio Mc, MD

## 2017-02-04 NOTE — Patient Instructions (Signed)
Plain x rays today of the hip may show a calcification of the adductor muscle if the hip if there was prior trauma to it  We will more thank likely need to have you see Sports Medicine for an ultrasound of the area.

## 2017-02-05 ENCOUNTER — Encounter: Payer: Self-pay | Admitting: Internal Medicine

## 2017-02-05 DIAGNOSIS — M6100 Myositis ossificans traumatica, unspecified site: Secondary | ICD-10-CM | POA: Insufficient documentation

## 2017-02-05 NOTE — Assessment & Plan Note (Addendum)
He underwent LAMINECTOMY POSTERIOR LUMBAR &/DECOMPRESSION L3-L5  At [facility] in Sept 2017 with complete resolution of pain for 4 weeks, followed by progressive return of pain secondary to osteomyelitis

## 2017-02-05 NOTE — Assessment & Plan Note (Signed)
Persistent, etiology unclear, nota reported s/e of ceftriazone or cymbalta. Referred   For ENT evaluation

## 2017-02-05 NOTE — Assessment & Plan Note (Signed)
h has 2 calcified masses in right lateral thigh that have been asymptomatic for 2 years, but now problematic due to change  In sleeping position that is the result of hi lumbar spine surgery he has no known history of trauma or injection to/in this area,  Recommended Orthopedics referral for possible biopsy

## 2017-02-05 NOTE — Assessment & Plan Note (Signed)
Secondary to persistent back pain secondary to spinal stenosis.  Managed with  cymbalta

## 2017-02-10 ENCOUNTER — Ambulatory Visit: Admitting: Podiatry

## 2017-03-30 ENCOUNTER — Encounter: Payer: Self-pay | Admitting: Internal Medicine

## 2017-04-26 ENCOUNTER — Other Ambulatory Visit: Payer: Self-pay | Admitting: Internal Medicine

## 2017-05-07 ENCOUNTER — Ambulatory Visit (INDEPENDENT_AMBULATORY_CARE_PROVIDER_SITE_OTHER): Admitting: *Deleted

## 2017-05-07 DIAGNOSIS — R7989 Other specified abnormal findings of blood chemistry: Secondary | ICD-10-CM

## 2017-05-07 MED ORDER — TESTOSTERONE CYPIONATE 200 MG/ML IM SOLN
100.0000 mg | INTRAMUSCULAR | Status: DC
  Administered 2017-05-07 – 2018-12-02 (×5): 100 mg via INTRAMUSCULAR

## 2017-05-07 NOTE — Progress Notes (Signed)
Patient presented for testosterone injection to RUQ, patient voiced no concerns,  nor showed any sign of distress during injection.

## 2017-05-07 NOTE — Progress Notes (Signed)
  I have reviewed the above information and agree with above.   Teresa Tullo, MD 

## 2017-05-21 ENCOUNTER — Ambulatory Visit (INDEPENDENT_AMBULATORY_CARE_PROVIDER_SITE_OTHER)

## 2017-05-21 DIAGNOSIS — R7989 Other specified abnormal findings of blood chemistry: Secondary | ICD-10-CM | POA: Diagnosis not present

## 2017-05-21 MED ORDER — TESTOSTERONE CYPIONATE 200 MG/ML IM SOLN: 100.0000 mg | Freq: Once | INTRAMUSCULAR | Status: DC

## 2017-05-21 NOTE — Progress Notes (Signed)
Patient came in for testosterone injection.  Patient brought his own supply.  Administered in Left upper outer quadrant.  Patient tolerated well.

## 2017-06-04 ENCOUNTER — Ambulatory Visit (INDEPENDENT_AMBULATORY_CARE_PROVIDER_SITE_OTHER): Admitting: *Deleted

## 2017-06-04 DIAGNOSIS — R7989 Other specified abnormal findings of blood chemistry: Secondary | ICD-10-CM

## 2017-06-04 MED ORDER — TESTOSTERONE CYPIONATE 200 MG/ML IM SOLN
100.0000 mg | Freq: Once | INTRAMUSCULAR | Status: AC
  Administered 2017-06-04: 100 mg via INTRAMUSCULAR

## 2017-06-04 NOTE — Progress Notes (Signed)
Patient presented for testosterone injection to RUQ patient tolerated injection well , no complaints or concerns noted.  Patient did ask when should he have his testosterone levels checked?

## 2017-06-08 ENCOUNTER — Telehealth: Payer: Self-pay | Admitting: Internal Medicine

## 2017-06-08 DIAGNOSIS — R7989 Other specified abnormal findings of blood chemistry: Secondary | ICD-10-CM

## 2017-06-08 DIAGNOSIS — R5382 Chronic fatigue, unspecified: Secondary | ICD-10-CM

## 2017-06-08 DIAGNOSIS — Z125 Encounter for screening for malignant neoplasm of prostate: Secondary | ICD-10-CM

## 2017-06-08 DIAGNOSIS — E785 Hyperlipidemia, unspecified: Secondary | ICD-10-CM

## 2017-06-08 NOTE — Telephone Encounter (Signed)
Pt scheduled his physical for 8/24, he would like to have labs done before. Please advise, thank you!  Call pt @ [phone]

## 2017-06-08 NOTE — Telephone Encounter (Signed)
Labs have been ordered and pt has been scheduled for a lab appt. Pt is aware of appt date and time.  

## 2017-06-17 ENCOUNTER — Ambulatory Visit (INDEPENDENT_AMBULATORY_CARE_PROVIDER_SITE_OTHER): Admitting: *Deleted

## 2017-06-17 DIAGNOSIS — R7989 Other specified abnormal findings of blood chemistry: Secondary | ICD-10-CM | POA: Diagnosis not present

## 2017-06-17 MED ORDER — TESTOSTERONE CYPIONATE 200 MG/ML IM SOLN
100.0000 mg | Freq: Once | INTRAMUSCULAR | Status: AC
  Administered 2017-06-17: 100 mg via INTRAMUSCULAR

## 2017-06-17 NOTE — Progress Notes (Signed)
Patient into office for testosterone injection eery 14 days,injection given IM into LUQ, patient voiced no concerns nor voiced any complaint during injection. Patient has question has to when would he need his next testosterone level drawn?

## 2017-06-18 ENCOUNTER — Ambulatory Visit

## 2017-07-02 ENCOUNTER — Ambulatory Visit (INDEPENDENT_AMBULATORY_CARE_PROVIDER_SITE_OTHER)

## 2017-07-02 DIAGNOSIS — R7989 Other specified abnormal findings of blood chemistry: Secondary | ICD-10-CM | POA: Diagnosis not present

## 2017-07-02 MED ORDER — TESTOSTERONE CYPIONATE 200 MG/ML IM SOLN
100.0000 mg | INTRAMUSCULAR | Status: DC
  Administered 2017-07-02: 100 mg via INTRAMUSCULAR

## 2017-07-02 NOTE — Progress Notes (Addendum)
Patient comes in for Testosterone injection. Injected into Right Ventrogluteal site. Patient tolerated injection. No complaints of pain after injection.   Reviewed.  Dr Nicki Reaper

## 2017-07-09 ENCOUNTER — Other Ambulatory Visit: Payer: Self-pay | Admitting: Internal Medicine

## 2017-07-09 ENCOUNTER — Other Ambulatory Visit (INDEPENDENT_AMBULATORY_CARE_PROVIDER_SITE_OTHER)

## 2017-07-09 DIAGNOSIS — R5382 Chronic fatigue, unspecified: Secondary | ICD-10-CM | POA: Diagnosis not present

## 2017-07-09 DIAGNOSIS — Z125 Encounter for screening for malignant neoplasm of prostate: Secondary | ICD-10-CM

## 2017-07-09 DIAGNOSIS — E785 Hyperlipidemia, unspecified: Secondary | ICD-10-CM

## 2017-07-09 DIAGNOSIS — R7989 Other specified abnormal findings of blood chemistry: Secondary | ICD-10-CM

## 2017-07-09 LAB — COMPREHENSIVE METABOLIC PANEL
ALT: 28 U/L (ref 0–53)
AST: 23 U/L (ref 0–37)
Albumin: 4.6 g/dL (ref 3.5–5.2)
Alkaline Phosphatase: 82 U/L (ref 39–117)
BILIRUBIN TOTAL: 0.6 mg/dL (ref 0.2–1.2)
BUN: 12 mg/dL (ref 6–23)
CHLORIDE: 103 meq/L (ref 96–112)
CO2: 32 meq/L (ref 19–32)
CREATININE: 1.17 mg/dL (ref 0.40–1.50)
Calcium: 9.2 mg/dL (ref 8.4–10.5)
GFR: 66.68 mL/min (ref >60.00)
GLUCOSE: 114 mg/dL — AB (ref 70–99)
Potassium: 4.6 mEq/L (ref 3.5–5.1)
SODIUM: 140 meq/L (ref 135–145)
Total Protein: 7 g/dL (ref 6.0–8.3)

## 2017-07-09 LAB — CBC WITH DIFFERENTIAL/PLATELET
BASOS ABS: 0 10*3/uL (ref 0.0–0.1)
Basophils Relative: 0.6 % (ref 0.0–3.0)
EOS ABS: 0.2 10*3/uL (ref 0.0–0.7)
Eosinophils Relative: 3 % (ref 0.0–5.0)
HCT: 46.3 % (ref 39.0–52.0)
Hemoglobin: 15.3 g/dL (ref 13.0–17.0)
LYMPHS ABS: 2.1 10*3/uL (ref 0.7–4.0)
Lymphocytes Relative: 32.6 % (ref 12.0–46.0)
MCHC: 33 g/dL (ref 30.0–36.0)
MCV: 88 fl (ref 78.0–100.0)
MONO ABS: 0.5 10*3/uL (ref 0.1–1.0)
MONOS PCT: 7 % (ref 3.0–12.0)
NEUTROS ABS: 3.7 10*3/uL (ref 1.4–7.7)
NEUTROS PCT: 56.8 % (ref 43.0–77.0)
PLATELETS: 228 10*3/uL (ref 150.0–400.0)
RBC: 5.26 Mil/uL (ref 4.22–5.81)
RDW: 14.4 % (ref 11.5–15.5)
WBC: 6.5 10*3/uL (ref 4.0–10.5)

## 2017-07-09 LAB — LDL CHOLESTEROL, DIRECT: Direct LDL: 102 mg/dL

## 2017-07-09 LAB — PSA: PSA: 4.63 ng/mL — AB (ref 0.10–4.00)

## 2017-07-09 LAB — LIPID PANEL
CHOL/HDL RATIO: 4
Cholesterol: 176 mg/dL (ref 0–200)
HDL: 43.8 mg/dL (ref >39.00)
NONHDL: 132.36
TRIGLYCERIDES: 218 mg/dL — AB (ref 0.0–149.0)
VLDL: 43.6 mg/dL — ABNORMAL HIGH (ref 0.0–40.0)

## 2017-07-09 LAB — TESTOSTERONE: TESTOSTERONE: 439.48 ng/dL (ref 300.00–890.00)

## 2017-07-09 LAB — VITAMIN D 25 HYDROXY (VIT D DEFICIENCY, FRACTURES): VITD: 46.54 ng/mL (ref 30.00–100.00)

## 2017-07-09 LAB — TSH: TSH: 1.54 u[IU]/mL (ref 0.35–4.50)

## 2017-07-12 ENCOUNTER — Encounter: Payer: Self-pay | Admitting: Internal Medicine

## 2017-07-13 ENCOUNTER — Other Ambulatory Visit: Payer: Self-pay | Admitting: Internal Medicine

## 2017-07-15 ENCOUNTER — Other Ambulatory Visit

## 2017-07-15 ENCOUNTER — Ambulatory Visit

## 2017-07-16 ENCOUNTER — Ambulatory Visit

## 2017-07-24 ENCOUNTER — Encounter: Payer: Self-pay | Admitting: Internal Medicine

## 2017-07-24 ENCOUNTER — Ambulatory Visit (INDEPENDENT_AMBULATORY_CARE_PROVIDER_SITE_OTHER): Admitting: Internal Medicine

## 2017-07-24 VITALS — BP 122/74 | HR 96 | Temp 98.1°F | Resp 15 | Ht 71.0 in | Wt 233.6 lb

## 2017-07-24 DIAGNOSIS — R972 Elevated prostate specific antigen [PSA]: Secondary | ICD-10-CM | POA: Diagnosis not present

## 2017-07-24 DIAGNOSIS — Z0000 Encounter for general adult medical examination without abnormal findings: Secondary | ICD-10-CM | POA: Diagnosis not present

## 2017-07-24 DIAGNOSIS — R0683 Snoring: Secondary | ICD-10-CM

## 2017-07-24 DIAGNOSIS — M5417 Radiculopathy, lumbosacral region: Secondary | ICD-10-CM | POA: Diagnosis not present

## 2017-07-24 DIAGNOSIS — E782 Mixed hyperlipidemia: Secondary | ICD-10-CM

## 2017-07-24 DIAGNOSIS — E663 Overweight: Secondary | ICD-10-CM | POA: Diagnosis not present

## 2017-07-24 DIAGNOSIS — I10 Essential (primary) hypertension: Secondary | ICD-10-CM | POA: Diagnosis not present

## 2017-07-24 DIAGNOSIS — G4733 Obstructive sleep apnea (adult) (pediatric): Secondary | ICD-10-CM

## 2017-07-24 DIAGNOSIS — R0681 Apnea, not elsewhere classified: Secondary | ICD-10-CM

## 2017-07-24 DIAGNOSIS — E291 Testicular hypofunction: Secondary | ICD-10-CM

## 2017-07-24 MED ORDER — GABAPENTIN 300 MG PO CAPS: 300.0000 mg | ORAL_CAPSULE | Freq: Three times a day (TID) | ORAL | 3 refills | Status: DC

## 2017-07-24 MED ORDER — TESTOSTERONE CYPIONATE 200 MG/ML IM SOLN: 100.0000 mg | INTRAMUSCULAR | 3 refills | Status: DC

## 2017-07-24 NOTE — Patient Instructions (Addendum)
Increase the celebrex to twice daily for  One week.   You can add up to 2000 mg tylenol every day ,  In divided doses   Add the neurontin  Back,  At night,  Starting with 300 mg,  And increase gradually as needed   For your sleep apnea:  Sleep study followed by ENT eval with Dr Kathyrn Sheriff  Lose 15 lbs over the next 3 months   Urology referral for elevated PSA   This is  Dr. Lupita Dawn  example of a  "Low GI"  Diet:  It will allow you to lose 4 to 8  lbs  per month if you follow it carefully.  Your goal with exercise is a minimum of 30 minutes of aerobic exercise 5 days per week (Walking does not count once it becomes easy!)    All of the foods can be found at grocery stores and in bulk at [employer].  The Atkins protein bars and shakes are available in more varieties at [employer], [employer] and [employer].     7 AM Breakfast:  Choose from the following:  Low carbohydrate Protein  Shakes (I recommend the  Premier Protein chocolate shakes,  EAS AdvantEdge "Carb Control" shakes  Or the Atkins shakes all are under 3 net carbs)     a scrambled egg/bacon/cheese burrito made with Mission's "carb balance" whole wheat tortilla  (about 10 net carbs )  Regulatory affairs officer (basically a quiche without the pastry crust) that is eaten cold and very convenient way to get your eggs.  8 carbs)  If you make your own protein shakes, avoid bananas and pineapple,  And use low carb greek yogurt or original /unsweetened almond or soy milk    Avoid cereal and bananas, oatmeal and cream of wheat and grits. They are loaded with carbohydrates!   10 AM: high protein snack:  Protein bar by Atkins (the snack size, under 200 cal, usually < 6 net carbs).    A stick of cheese:  Around 1 carb,  100 cal     Dannon Light n Fit [place] Yogurt  (80 cal, 8 carbs)  Other so called "protein bars" and Greek yogurts tend to be loaded with carbohydrates.  Remember, in food advertising, the word "energy" is synonymous  for " carbohydrate."  Lunch:   A Sandwich using the bread choices listed, Can use any  Eggs,  lunchmeat, grilled meat or canned tuna), avocado, regular mayo/mustard  and cheese.  A Salad using blue cheese, ranch,  Goddess or vinagrette,  Avoid taco shells, croutons or "confetti" and no "candied nuts" but regular nuts OK.   No pretzels, nabs  or chips.  Pickles and miniature sweet peppers are a good low carb alternative that provide a "crunch"  The bread is the only source of carbohydrate in a sandwich and  can be decreased by trying some of the attached alternatives to traditional loaf bread   Avoid "Low fat dressings, as well as [place] dressings They are loaded with sugar!   3 PM/ Mid day  Snack:  Consider  1 ounce of  almonds, walnuts, pistachios, pecans, peanuts,  Macadamia nuts or a nut medley.  Avoid "granola and granola bars "  Mixed nuts are ok in moderation as long as there are no raisins,  cranberries or dried fruit.   KIND bars are OK if you get the low glycemic index variety   Try the prosciutto/mozzarella cheese sticks by  [employer]  In Forensic psychologist /backery section   High protein      6 PM  Dinner:     Meat/fowl/fish with a green salad, and either broccoli, cauliflower, green beans, spinach, brussel sprouts or  Lima beans. DO NOT BREAD THE PROTEIN!!      There is a low carb pasta by Dreamfield's that is acceptable and tastes great: only 5 digestible carbs/serving.( All grocery stores but BJs carry it ) Several ready made meals are available low carb:   Try Michel Angelo's chicken piccata or chicken or eggplant parm over low carb pasta.(Lowes and BJs)   Marjory Lies Sanchez's "Carnitas" (pulled pork, no sauce,  0 carbs) or his beef pot roast to make a dinner burrito (at [employer]J's)  Pesto over low carb pasta (bj's sells a good quality pesto in the center refrigerated section of the deli   Try satueeing  Cheral Marker with mushroooms as a good side   [employer] makes a mashed  cauliflower that tastes like mashed potatoes  Whole wheat pasta is still full of digestible carbs and  Not as low in glycemic index as Dreamfield's.   Brown rice is still rice,  So skip the rice and noodles if you eat [place] or [place] (or at least limit to 1/2 cup)  9 PM snack :   Breyer's "low carb" fudgsicle or  ice cream bar (Carb Smart line), or  Weight Watcher's ice cream bar , or another "no sugar added" ice cream;  a serving of fresh berries/cherries with whipped cream   Cheese or DANNON'S LlGHT N FIT GREEK YOGURT  8 ounces of [place] unsweetened almond/cococunut milk    Treat yourself to a parfait made with whipped cream blueberiies, walnuts and vanilla greek yogurt  Avoid bananas, pineapple, grapes  and watermelon on a regular basis because they are high in sugar.  THINK OF THEM AS DESSERT  Remember that snack Substitutions should be less than 10 NET carbs per serving and meals < 20 carbs. Remember to subtract fiber grams to get the "net carbs."

## 2017-07-24 NOTE — Progress Notes (Signed)
Patient ID: Bobby Rich, male    DOB: July 10, 1951  Age: 66 y.o. MRN: [id number]  The patient is here for annual preventive examination and management of other chronic and acute problems. Last seen March 2018    Colonoscopy 2013 PSA elevated this month,  Discussed Urology referral.  Prefers [facility]    Lab Results  Component Value Date   PSA 4.63 (H) 07/09/2017   PSA 3.05 06/30/2016   PSA 2.90 06/28/2015    The risk factors are reflected in the social history.  The roster of all physicians providing medical care to patient - is listed in the Snapshot section of the chart.  Activities of daily living:  The patient is 100% independent in all ADLs: dressing, toileting, feeding as well as independent mobility  Home safety : The patient has smoke detectors in the home. They wear seatbelts.  There are no firearms at home. There is no violence in the home.   There is no risks for hepatitis, STDs or HIV. There is no   history of blood transfusion. They have no travel history to infectious disease endemic areas of the world.  The patient has seen their dentist in the last six month. They have seen their eye doctor in the last year. HAD lASIK SURGERY 8 YEARS AGO,  TOLD he has an early cataract recently right eye.   They  Have had hearing test and are waiting for hearing aides   Discussed the need for sun protection: hats, long sleeves and use of sunscreen if there is significant sun exposure.   Diet: the importance of a healthy diet is discussed. They do have a healthy diet.  The benefits of regular aerobic exercise were discussed. She walks 4 times per week ,  20 minutes.   Depression screen: there are no signs or vegative symptoms of depression- irritability, change in appetite, anhedonia, sadness/tearfullness.  The following portions of the patient's history were reviewed and updated as appropriate: allergies, current medications, past family history, past medical history,  past  surgical history, past social history  and problem list.  Visual acuity was not assessed per patient preference since she has regular follow up with her ophthalmologist. Hearing and body mass index were assessed and reviewed.   During the course of the visit the patient was educated and counseled about appropriate screening and preventive services including : fall prevention , diabetes screening, nutrition counseling, colorectal cancer screening, and recommended immunizations.    CC: The primary encounter diagnosis was Elevated PSA, less than 10 ng/ml. Diagnoses of OSA (obstructive sleep apnea), Snoring, Witnessed apneic spells, Visit for preventive health examination, Overweight (BMI 25.0-29.9), L-S radiculopathy, Hypogonadism in male, Sleep apnea, obstructive, Essential hypertension, and Mixed hyperlipidemia were also pertinent to this visit.  Weight gain of 18 lbs since march   Still dealing with fatigue.  Labs normal.  Has untreated OSA ,  Did not tolerate prior trial of CPAP full face mask despite trial of 6 months.  Currently having trouble staying awake,  In bed 9 hours daily. Last sleep study was in [place],  AHI was .reportedly > 60,   Currently  Snoring so loudly patient's wife has to sleep in the other room.     Back pain has become moderate again. Sciatica has returned and is on the right side for the past week after lifing something the wrong way .   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   Waiting to get hearing aide from [place].   Tinnitus still a problem.   [name] has a past medical history of Cancer ([place]); Depression; GERD (gastroesophageal reflux disease); Hyperlipidemia; Hypertension; and Low testosterone.   He has a past surgical history that includes Spine surgery (2012);  Hernia repair (Bilateral); Cosmetic surgery; and Lumbar laminectomy (08/2016).   His family history includes Early death (age of onset: 55) in his father; Heart disease (age of onset: 46) in his father; Heart disease (age of onset: 76) in his brother; Stroke in his mother.He reports that he has never smoked. He has never used smokeless tobacco. He reports that he drinks alcohol. He reports that he does not use drugs.  Outpatient Medications Prior to Visit  Medication Sig Dispense Refill  . aspirin 81 MG tablet Take 81 mg by mouth daily.     Marland Kitchen atorvastatin (LIPITOR) 20 MG tablet TAKE 1 TABLET DAILY 90 tablet 3  . celecoxib (CELEBREX) 200 MG capsule Take 1 capsule (200 mg total) by mouth 2 (two) times daily. (Patient taking differently: Take 200 mg by mouth once as needed. ) 180 capsule 3  . Coenzyme Q10 (CO Q-10) 100 MG CAPS Take 1 capsule by mouth daily.    . DULoxetine (CYMBALTA) 30 MG capsule TAKE 1 CAPSULE DAILY 90 capsule 1  . LACTOBACILLUS PO Take 1 capsule by mouth daily.    Marland Kitchen losartan-hydrochlorothiazide (HYZAAR) 50-12.5 MG tablet TAKE 1 TABLET DAILY 90 tablet 3  . Omega 3-6-9 Fatty Acids (TRIPLE OMEGA-3-6-9) CAPS Take 1 capsule by mouth daily.    Marland Kitchen omeprazole (PRILOSEC) 40 MG capsule TAKE 1 CAPSULE DAILY 90 capsule 0  . sildenafil (VIAGRA) 100 MG tablet Take 1 tablet (100 mg total) by mouth daily as needed for erectile dysfunction. 18 tablet 3  . testosterone cypionate (DEPOTESTOSTERONE CYPIONATE) 200 MG/ML injection Inject 0.5 mLs (100 mg total) into the muscle every 14 (fourteen) days. 10 mL 0  . traMADol (ULTRAM) 50 MG tablet Take 1 tablet (50 mg total) by mouth every 8 (eight) hours as needed. (Patient not taking: Reported on 07/24/2017) 60 tablet 1   Facility-Administered Medications Prior to Visit  Medication Dose Route Frequency Provider Last Rate Last Dose  . testosterone cypionate (DEPOTESTOSTERONE CYPIONATE) injection 100 mg  100 mg Intramuscular Q14 Days Crecencio Mc, MD    100 mg at 05/21/17 1144  . testosterone cypionate (DEPOTESTOSTERONE CYPIONATE) injection 100 mg  100 mg Intramuscular Once Crecencio Mc, MD      . testosterone cypionate (DEPOTESTOSTERONE CYPIONATE) injection 100 mg  100 mg Intramuscular Q14 Days Crecencio Mc, MD   100 mg at 07/02/17 1143    Review of Systems   Patient denies headache, fevers, malaise, unintentional weight loss, skin rash, eye pain, sinus congestion and sinus pain, sore throat, dysphagia,  hemoptysis , cough, dyspnea, wheezing, chest pain, palpitations, orthopnea, edema, abdominal pain, nausea, melena, diarrhea, constipation, flank pain, dysuria, hematuria, urinary  Frequency, nocturia, numbness, tingling, seizures,  Focal weakness, Loss of consciousness,  Tremor, insomnia, depression, anxiety, and suicidal ideation.     Objective:  BP 122/74 (BP Location: Left Arm, Patient Position: Sitting, Cuff Size: Normal)   Pulse 96   Temp 98.1 F (36.7 C) (Oral)   Resp 15   Ht 5\' 11"  (1.803 m)   Wt 233 lb 9.6 oz (106 kg)   SpO2 95%   BMI 32.58  kg/m   Physical Exam   General appearance: alert, cooperative and appears stated age Ears: normal TM's and external ear canals both ears Throat: lips, mucosa, and tongue normal; teeth and gums normal Neck: no adenopathy, no carotid bruit, supple, symmetrical, trachea midline and thyroid not enlarged, symmetric, no tenderness/mass/nodules Back: symmetric, no curvature. ROM normal. No CVA tenderness. Lungs: clear to auscultation bilaterally Heart: regular rate and rhythm, S1, S2 normal, no murmur, click, rub or gallop Abdomen: soft, non-tender; bowel sounds normal; no masses,  no organomegaly Pulses: 2+ and symmetric Skin: Skin color, texture, turgor normal. No rashes or lesions Lymph nodes: Cervical, supraclavicular, and axillary nodes normal.   Assessment & Plan:   Problem List Items Addressed This Visit    Elevated PSA, less than 10 ng/ml - Primary    He is supplementing  testosterone and level is normal.  Referral to Urology       Relevant Orders   Ambulatory referral to Urology   HTN (hypertension)    Well controlled on current regimen. Renal function stable, no changes today.  Lab Results  Component Value Date   CREATININE 1.17 07/09/2017   Lab Results  Component Value Date   NA 140 07/09/2017   K 4.6 07/09/2017   CL 103 07/09/2017   CO2 32 07/09/2017         Hyperlipidemia    Currently managed with atorvastatin.   LFTs normal.   Lab Results  Component Value Date   CHOL 176 07/09/2017   HDL 43.80 07/09/2017   LDLCALC 89 08/29/2016   LDLDIRECT 102.0 07/09/2017   TRIG 218.0 (H) 07/09/2017   CHOLHDL 4 07/09/2017   Lab Results  Component Value Date   ALT 28 07/09/2017   AST 23 07/09/2017   ALKPHOS 82 07/09/2017   BILITOT 0.6 07/09/2017         Hypogonadism in male    Previously treated with Fortesta gel, now with biweekly testosterone injections . Recent level is normal   Lab Results  Component Value Date   TESTOSTERONE 439.48 07/09/2017         L-S radiculopathy    Secondary to physical activity.  No fevers or other symptoms suggestive of recurrent osteomyelitis .  Increase celebrex to bid for one week,  tylenol 2000 mg daily , Resume gabapentin       Relevant Medications   Caffeine 100 MG TABS   gabapentin (NEURONTIN) 300 MG capsule   Overweight (BMI 25.0-29.9)    Aggravated by back pain. I have addressed  BMI and recommended wt loss of 10% of body weight over the next 6 months using a low glycemic index diet and regular exercise a minimum of 5 days per week.        Sleep apnea, obstructive    Untreated  For years due to mask intolerance. His fatigue has become severe and affecting his daytime activities and work performance .  Repeat sleep study ordered along with ENT evaluation as he is interested in surgical alternatives       Visit for preventive health examination    Annual comprehensive preventive exam was  done as well as an evaluation and management of acute and chronic conditions .  During the course of the visit the patient was educated and counseled about appropriate screening and preventive services including :  diabetes screening, lipid analysis with projected  10 year  risk for CAD , nutrition counseling, prostate and colorectal cancer screening, and recommended immunizations.  Printed recommendations for  health maintenance screenings was given.        Other Visit Diagnoses    OSA (obstructive sleep apnea)       Relevant Orders   Ambulatory referral to ENT   Snoring       Relevant Orders   Ambulatory referral to Sleep Studies   Witnessed apneic spells       Relevant Orders   Ambulatory referral to Sleep Studies      I have discontinued Mr. Doubek traMADol. I am also having him maintain his aspirin, Co Q-10, LACTOBACILLUS PO, TRIPLE OMEGA-3-6-9, celecoxib, sildenafil, losartan-hydrochlorothiazide, omeprazole, DULoxetine, atorvastatin, Caffeine, gabapentin, and testosterone cypionate. We will continue to administer testosterone cypionate, testosterone cypionate, and testosterone cypionate.  Meds ordered this encounter  Medications  . Caffeine 100 MG TABS    Sig: Take 167 mg by mouth daily.  Marland Kitchen gabapentin (NEURONTIN) 300 MG capsule    Sig: Take 1 capsule (300 mg total) by mouth 3 (three) times daily.    Dispense:  90 capsule    Refill:  3  . testosterone cypionate (DEPOTESTOSTERONE CYPIONATE) 200 MG/ML injection    Sig: Inject 0.5 mLs (100 mg total) into the muscle every 14 (fourteen) days.    Dispense:  10 mL    Refill:  3    Medications Discontinued During This Encounter  Medication Reason  . traMADol (ULTRAM) 50 MG tablet Patient has not taken in last 30 days  . testosterone cypionate (DEPOTESTOSTERONE CYPIONATE) 200 MG/ML injection Reorder    Follow-up: No Follow-up on file.   Crecencio Mc, MD

## 2017-07-25 DIAGNOSIS — R972 Elevated prostate specific antigen [PSA]: Secondary | ICD-10-CM | POA: Insufficient documentation

## 2017-07-25 NOTE — Assessment & Plan Note (Signed)
Untreated  For years due to mask intolerance. His fatigue has become severe and affecting his daytime activities and work performance .  Repeat sleep study ordered along with ENT evaluation as he is interested in surgical alternatives

## 2017-07-25 NOTE — Assessment & Plan Note (Signed)
Secondary to physical activity.  No fevers or other symptoms suggestive of recurrent osteomyelitis .  Increase celebrex to bid for one week,  tylenol 2000 mg daily , Resume gabapentin

## 2017-07-25 NOTE — Assessment & Plan Note (Signed)
Previously treated with Fortesta gel, now with biweekly testosterone injections . Recent level is normal   Lab Results  Component Value Date   TESTOSTERONE 439.48 07/09/2017

## 2017-07-25 NOTE — Assessment & Plan Note (Signed)
Well controlled on current regimen. Renal function stable, no changes today.  Lab Results  Component Value Date   CREATININE 1.17 07/09/2017   Lab Results  Component Value Date   NA 140 07/09/2017   K 4.6 07/09/2017   CL 103 07/09/2017   CO2 32 07/09/2017

## 2017-07-25 NOTE — Assessment & Plan Note (Signed)
Aggravated by back pain. I have addressed  BMI and recommended wt loss of 10% of body weight over the next 6 months using a low glycemic index diet and regular exercise a minimum of 5 days per week.

## 2017-07-25 NOTE — Assessment & Plan Note (Signed)
Currently managed with atorvastatin.   LFTs normal.   Lab Results  Component Value Date   CHOL 176 07/09/2017   HDL 43.80 07/09/2017   LDLCALC 89 08/29/2016   LDLDIRECT 102.0 07/09/2017   TRIG 218.0 (H) 07/09/2017   CHOLHDL 4 07/09/2017   Lab Results  Component Value Date   ALT 28 07/09/2017   AST 23 07/09/2017   ALKPHOS 82 07/09/2017   BILITOT 0.6 07/09/2017

## 2017-07-25 NOTE — Assessment & Plan Note (Signed)
He is supplementing testosterone and level is normal.  Referral to Urology

## 2017-07-25 NOTE — Assessment & Plan Note (Signed)

## 2017-07-27 NOTE — Telephone Encounter (Signed)
Mailed unread message to patient. thanks 

## 2017-07-29 NOTE — Progress Notes (Signed)
Cardiology Office Note  Date:  07/30/2017   ID:  Kysen, Wetherington 1951-10-25, MRN [id number]  PCP:  Crecencio Mc, MD   Chief Complaint  Patient presents with  . other    New patient. Patient denies chest pain and SOB. Meds reviewed verbally with patient.     HPI:   Mr. Pottenger is a 66 year old gentleman with past medical history of Back pain Fatigue Weight gain OSA HTN Hyperlipidemia Family hx of heart disease ETOH Depression,  GERD Nonsmoker Who presents by referral from Dr. Derrel Nip -for consultation of his fatigue, feeling tired, family history of coronary disease  In general reports doing well but has noticed increasing fatigue, more tired than usual Recent scare, wife in the hospital with stress cardiomyopathy after colonoscopy He does have strong family history of coronary disease, once to make sure that everything is okay  No regular exercise program Compliant with his medications  Denies any chest pain concerning for angina, no significant shortness of breath No leg swelling/edema No PND or orthopnea  Previously seen by cardiology 02/02/2013, notes reviewed No documented coronary disease  In terms of his family history Dad age 17, smoked, MI Brother, MI, 65, CABG  EKG personally reviewed by myself on todays visit Shows normal sinus rhythm with rate 72 bpm no significant ST or T-wave changes    PMH:   has a past medical history of Cancer ([place]); Depression; GERD (gastroesophageal reflux disease); Hyperlipidemia; Hypertension; and Low testosterone.  PSH:    Past Surgical History:  Procedure Laterality Date  . COSMETIC SURGERY    . HERNIA REPAIR Bilateral    with mesh   . LUMBAR LAMINECTOMY  08/2016  . SPINE SURGERY  2012   [place] L3 4 5     Current Outpatient Prescriptions  Medication Sig Dispense Refill  . aspirin 81 MG tablet Take 81 mg by mouth daily.     Marland Kitchen atorvastatin (LIPITOR) 20 MG tablet TAKE 1 TABLET DAILY 90 tablet 3  . Caffeine  100 MG TABS Take 167 mg by mouth daily.    . celecoxib (CELEBREX) 200 MG capsule Take 1 capsule (200 mg total) by mouth 2 (two) times daily. (Patient taking differently: Take 200 mg by mouth once as needed. ) 180 capsule 3  . Coenzyme Q10 (CO Q-10) 100 MG CAPS Take 1 capsule by mouth daily.    . DULoxetine (CYMBALTA) 30 MG capsule TAKE 1 CAPSULE DAILY 90 capsule 1  . gabapentin (NEURONTIN) 300 MG capsule Take 1 capsule (300 mg total) by mouth 3 (three) times daily. 90 capsule 3  . LACTOBACILLUS PO Take 1 capsule by mouth daily.    Marland Kitchen losartan-hydrochlorothiazide (HYZAAR) 50-12.5 MG tablet TAKE 1 TABLET DAILY 90 tablet 3  . Omega 3-6-9 Fatty Acids (TRIPLE OMEGA-3-6-9) CAPS Take 1 capsule by mouth daily.    Marland Kitchen omeprazole (PRILOSEC) 40 MG capsule TAKE 1 CAPSULE DAILY 90 capsule 0  . sildenafil (VIAGRA) 100 MG tablet Take 1 tablet (100 mg total) by mouth daily as needed for erectile dysfunction. 18 tablet 3  . testosterone cypionate (DEPOTESTOSTERONE CYPIONATE) 200 MG/ML injection Inject 0.5 mLs (100 mg total) into the muscle every 14 (fourteen) days. 10 mL 3   Current Facility-Administered Medications  Medication Dose Route Frequency Provider Last Rate Last Dose  . testosterone cypionate (DEPOTESTOSTERONE CYPIONATE) injection 100 mg  100 mg Intramuscular Q14 Days Crecencio Mc, MD   100 mg at 05/21/17 1144  . testosterone cypionate (DEPOTESTOSTERONE CYPIONATE) injection 100 mg  100 mg Intramuscular Once Crecencio Mc, MD      . testosterone cypionate (DEPOTESTOSTERONE CYPIONATE) injection 100 mg  100 mg Intramuscular Q14 Days Crecencio Mc, MD   100 mg at 07/02/17 1143     Allergies:   Patient has no known allergies.   Social History:  The patient  reports that he has never smoked. He has never used smokeless tobacco. He reports that he does not drink alcohol or use drugs.   Family History:   family history includes Early death (age of onset: 43) in his father; Heart disease (age of onset:  40) in his father; Heart disease (age of onset: 81) in his brother; Stroke in his mother.    Review of Systems: Review of Systems  Constitutional: Negative.   Respiratory: Negative.   Cardiovascular: Negative.   Gastrointestinal: Negative.   Musculoskeletal: Negative.   Neurological: Negative.   Psychiatric/Behavioral: Negative.   All other systems reviewed and are negative.    PHYSICAL EXAM: VS:  BP 122/88 (BP Location: Left Arm, Patient Position: Sitting, Cuff Size: Normal)   Pulse 72   Ht 5\' 11"  (1.803 m)   Wt 232 lb (105.2 kg)   BMI 32.36 kg/m  , BMI Body mass index is 32.36 kg/m. GEN: Well nourished, well developed, in no acute distress  HEENT: normal  Neck: no JVD, carotid bruits, or masses Cardiac: RRR; no murmurs, rubs, or gallops,no edema  Respiratory:  clear to auscultation bilaterally, normal work of breathing GI: soft, nontender, nondistended, + BS MS: no deformity or atrophy  Skin: warm and dry, no rash Neuro:  Strength and sensation are intact Psych: euthymic mood, full affect    Recent Labs: 07/09/2017: ALT 28; BUN 12; Creatinine, Ser 1.17; Hemoglobin 15.3; Platelets 228.0; Potassium 4.6; Sodium 140; TSH 1.54    Lipid Panel Lab Results  Component Value Date   CHOL 176 07/09/2017   HDL 43.80 07/09/2017   LDLCALC 89 08/29/2016   TRIG 218.0 (H) 07/09/2017      Wt Readings from Last 3 Encounters:  07/30/17 232 lb (105.2 kg)  07/24/17 233 lb 9.6 oz (106 kg)  02/04/17 215 lb (97.5 kg)       ASSESSMENT AND PLAN:  Essential hypertension - Plan: EKG 12-Lead, CT CARDIAC SCORING Blood pressure is well controlled on today's visit. No changes made to the medications.  Mixed hyperlipidemia - Plan: EKG 12-Lead, CT CARDIAC SCORING  discussed his lipid panel with him, will stay on Lipitor at current dose  Risk stratification studies discussed with him  Recommended CT coronary calcium scoring  If score is elevated, could push LDL less than 70  If  markedly elevated a need additional testing such a stress testing   Chronic fatigue  fatigue likely unrelated to underlying ischemia  Recommended a exercise program, efficient sleep   Sleep apnea, obstructive Recommended weight loss  Disposition:   F/U  as needed    Total encounter time more than 60 minutes  Greater than 50% was spent in counseling and coordination of care with the patient  Patient seen in consultation for Dr.  Derrel Nip and will be referred back to her office for ongoing care of the medical issues detailed above    Orders Placed This Encounter  Procedures  . CT CARDIAC SCORING  . EKG 12-Lead     Signed, Esmond Plants, M.D., Ph.D. 07/30/2017  [place], [place]

## 2017-07-30 ENCOUNTER — Encounter: Payer: Self-pay | Admitting: Cardiovascular Disease

## 2017-07-30 ENCOUNTER — Ambulatory Visit (INDEPENDENT_AMBULATORY_CARE_PROVIDER_SITE_OTHER): Admitting: Cardiovascular Disease

## 2017-07-30 VITALS — BP 122/88 | HR 72 | Ht 71.0 in | Wt 232.0 lb

## 2017-07-30 DIAGNOSIS — I10 Essential (primary) hypertension: Secondary | ICD-10-CM

## 2017-07-30 DIAGNOSIS — G4733 Obstructive sleep apnea (adult) (pediatric): Secondary | ICD-10-CM | POA: Diagnosis not present

## 2017-07-30 DIAGNOSIS — R5382 Chronic fatigue, unspecified: Secondary | ICD-10-CM

## 2017-07-30 DIAGNOSIS — E782 Mixed hyperlipidemia: Secondary | ICD-10-CM

## 2017-07-30 NOTE — Patient Instructions (Addendum)
Medication Instructions:   No medication changes made  Labwork:  No new labs needed  Testing/Procedures:  We will order a CT coronary calcium score Please call [phone] to schedule this at your convenience There is a one-time fee of$150 due at the time of your procedure [address], [address], [place]: It was a pleasure seeing you in the office today. Please call us if you have new issues that need to be addressed before your next appt.  [phone]  Your physician wants you to follow-up in: as needed  If you need a refill on your cardiac medications before your next appointment, please call your pharmacy.     Coronary Calcium Scan A coronary calcium scan is an imaging test used to look for deposits of calcium and other fatty materials (plaques) in the inner lining of the blood vessels of the heart (coronary arteries). These deposits of calcium and plaques can partly clog and narrow the coronary arteries without producing any symptoms or warning signs. This puts a person at risk for a heart attack. This test can detect these deposits before symptoms develop. Tell a health care provider about:  Any allergies you have.  All medicines you are taking, including vitamins, herbs, eye drops, creams, and over-the-counter medicines.  Any problems you or family members have had with anesthetic medicines.  Any blood disorders you have.  Any surgeries you have had.  Any medical conditions you have.  Whether you are pregnant or may be pregnant. What are the risks? Generally, this is a safe procedure. However, problems may occur, including:  Harm to a pregnant woman and her unborn baby. This test involves the use of radiation. Radiation exposure can be dangerous to a pregnant woman and her unborn baby. If you are pregnant, you generally should not have this procedure done.  Slight increase in the risk of cancer. This is because of the radiation involved  in the test.  What happens before the procedure? No preparation is needed for this procedure. What happens during the procedure?  You will undress and remove any jewelry around your neck or chest.  You will put on a hospital gown.  Sticky electrodes will be placed on your chest. The electrodes will be connected to an electrocardiogram (ECG) machine to record a tracing of the electrical activity of your heart.  A CT scanner will take pictures of your heart. During this time, you will be asked to lie still and hold your breath for 2-3 seconds while a picture of your heart is being taken. The procedure may vary among health care providers and hospitals. What happens after the procedure?  You can get dressed.  You can return to your normal activities.  It is up to you to get the results of your test. Ask your health care provider, or the department that is doing the test, when your results will be ready. Summary  A coronary calcium scan is an imaging test used to look for deposits of calcium and other fatty materials (plaques) in the inner lining of the blood vessels of the heart (coronary arteries).  Generally, this is a safe procedure. Tell your health care provider if you are pregnant or may be pregnant.  No preparation is needed for this procedure.  A CT scanner will take pictures of your heart.  You can return to your normal activities after the scan is done. This information is not intended to replace advice given to you by your health  care provider. Make sure you discuss any questions you have with your health care provider. Document Released: 05/15/2008 Document Revised: 10/06/2016 Document Reviewed: 10/06/2016 Elsevier Interactive Patient Education  2017 [employer].

## 2017-09-07 ENCOUNTER — Encounter: Payer: Self-pay | Admitting: Internal Medicine

## 2017-09-07 ENCOUNTER — Other Ambulatory Visit: Payer: Self-pay | Admitting: Internal Medicine

## 2017-09-07 DIAGNOSIS — M5417 Radiculopathy, lumbosacral region: Secondary | ICD-10-CM

## 2017-09-11 ENCOUNTER — Ambulatory Visit: Attending: Otolaryngology

## 2017-09-11 DIAGNOSIS — G4733 Obstructive sleep apnea (adult) (pediatric): Secondary | ICD-10-CM | POA: Insufficient documentation

## 2017-09-11 DIAGNOSIS — I10 Essential (primary) hypertension: Secondary | ICD-10-CM | POA: Diagnosis not present

## 2017-09-11 DIAGNOSIS — E669 Obesity, unspecified: Secondary | ICD-10-CM | POA: Diagnosis not present

## 2017-09-11 DIAGNOSIS — Z6830 Body mass index (BMI) 30.0-30.9, adult: Secondary | ICD-10-CM | POA: Diagnosis not present

## 2017-09-11 DIAGNOSIS — F5101 Primary insomnia: Secondary | ICD-10-CM | POA: Diagnosis not present

## 2017-09-11 DIAGNOSIS — R0683 Snoring: Secondary | ICD-10-CM | POA: Insufficient documentation

## 2017-09-22 ENCOUNTER — Telehealth: Payer: Self-pay | Admitting: Internal Medicine

## 2017-09-22 ENCOUNTER — Encounter: Payer: Self-pay | Admitting: Internal Medicine

## 2017-09-22 DIAGNOSIS — G4733 Obstructive sleep apnea (adult) (pediatric): Secondary | ICD-10-CM

## 2017-09-22 NOTE — Telephone Encounter (Signed)
Order for CPAP has been faxed.

## 2017-09-22 NOTE — Telephone Encounter (Signed)
CPAP ORDERED . AND IN BLUE FOLDER  MY CHART MESSAGE SENT

## 2017-09-22 NOTE — Assessment & Plan Note (Signed)
sleep study confirmed that you have sleep apnea I recommend that you start wearing the CPAP based on  CPAP titration study that was done to determine the best settings for your sleep. I have ordered the mask through sleep med

## 2017-09-29 ENCOUNTER — Other Ambulatory Visit: Payer: Self-pay | Admitting: Nurse Practitioner

## 2017-09-29 DIAGNOSIS — M961 Postlaminectomy syndrome, not elsewhere classified: Secondary | ICD-10-CM

## 2017-10-03 ENCOUNTER — Ambulatory Visit
Admission: RE | Admit: 2017-10-03 | Discharge: 2017-10-03 | Disposition: A | Discharge status: Home / Self Care | Source: Ambulatory Visit | Attending: Nurse Practitioner | Admitting: Nurse Practitioner

## 2017-10-03 DIAGNOSIS — M5124 Other intervertebral disc displacement, thoracic region: Secondary | ICD-10-CM | POA: Diagnosis not present

## 2017-10-03 DIAGNOSIS — M961 Postlaminectomy syndrome, not elsewhere classified: Secondary | ICD-10-CM | POA: Diagnosis present

## 2017-11-02 ENCOUNTER — Telehealth: Payer: Self-pay | Admitting: Internal Medicine

## 2017-11-02 NOTE — Telephone Encounter (Signed)
Copied from [place] [id number]. Topic: Referral - Status >> Nov 02, 2017 10:26 AM Cecelia Byars, NT wrote: Reason for CRM: Checking on status of referral to urologist unsure if information was given to patient , please call [phone]

## 2017-11-02 NOTE — Telephone Encounter (Signed)
Please advise 

## 2017-11-02 NOTE — Telephone Encounter (Signed)
mychart message regarding appointment has been sent out to him again.

## 2017-11-06 ENCOUNTER — Ambulatory Visit (INDEPENDENT_AMBULATORY_CARE_PROVIDER_SITE_OTHER): Admitting: Podiatry

## 2017-11-06 ENCOUNTER — Encounter: Payer: Self-pay | Admitting: Podiatry

## 2017-11-06 DIAGNOSIS — D492 Neoplasm of unspecified behavior of bone, soft tissue, and skin: Secondary | ICD-10-CM

## 2017-11-06 DIAGNOSIS — M7751 Other enthesopathy of right foot: Secondary | ICD-10-CM

## 2017-11-09 NOTE — Progress Notes (Signed)
   HPI: 66 year old male presenting today with a complaint of a soft mass under the right fifth toe.  He reports he had an injection earlier this year and now it feels like he is walking on a rock.  Walking for long periods of time increases the pain.  Resting the foot helps alleviate the pain.  Patient is here for further evaluation and treatment.   Past Medical History:  Diagnosis Date  . Cancer ([place])    basel cell skin cancer  . Depression   . GERD (gastroesophageal reflux disease)   . Hyperlipidemia   . Hypertension   . Low testosterone      Physical Exam: General: The patient is alert and oriented x3 in no acute distress.  Dermatology: Skin is warm, dry and supple bilateral lower extremities. Negative for open lesions or macerations.  Vascular: Palpable pedal pulses bilaterally. No edema or erythema noted. Capillary refill within normal limits.  Neurological: Epicritic and protective threshold grossly intact bilaterally.   Musculoskeletal Exam: Small palpable nodule noted to the plantar fifth MPJ with pain on palpation.  Range of motion within normal limits to all pedal and ankle joints bilateral. Muscle strength 5/5 in all groups bilateral.    Assessment: -Fifth MPJ capsulitis right   Plan of Care:  -Patient evaluated. - Injection of 0.5 mLs Celestone Soluspan injected into the right fifth MPJ. - Today we discussed the conservative versus surgical management of the presenting pathology. The patient opts for surgical management. All possible complications and details of the procedure were explained. All patient questions were answered. No guarantees were expressed or implied. - Authorization for surgery was initiated today. Surgery will consist of excision of soft tissue mass to the sub-fifth MPJ of the right foot. -Return to clinic 1 week postop.  Going on a cruise next week to the [place].   Edrick Kins, DPM [facility]  Dr. Edrick Kins, DPM    [address], [place] [address]                Office [phone]  Fax [phone]

## 2017-12-03 ENCOUNTER — Other Ambulatory Visit: Payer: Self-pay | Admitting: Internal Medicine

## 2017-12-11 ENCOUNTER — Other Ambulatory Visit: Payer: Self-pay | Admitting: Internal Medicine

## 2017-12-31 ENCOUNTER — Telehealth: Payer: Self-pay | Admitting: Internal Medicine

## 2017-12-31 ENCOUNTER — Inpatient Hospital Stay: Admission: RE | Admit: 2017-12-31 | Source: Ambulatory Visit

## 2017-12-31 NOTE — Telephone Encounter (Signed)
Copied from [place]. Topic: Quick Communication - See Telephone Encounter >> Dec 31, 2017  3:50 PM Cleaster Corin, [place] wrote: CRM for notification. See Telephone encounter for:   12/31/17. Pt. Calling to check on referral that was supposed to be sent to [facility] outpatient pain medicine. Pt. Can be reached at [phone]

## 2018-01-05 ENCOUNTER — Other Ambulatory Visit: Payer: Self-pay | Admitting: Internal Medicine

## 2018-02-19 DIAGNOSIS — Z9689 Presence of other specified functional implants: Secondary | ICD-10-CM | POA: Insufficient documentation

## 2018-02-22 ENCOUNTER — Telehealth: Payer: Self-pay | Admitting: *Deleted

## 2018-02-22 NOTE — Telephone Encounter (Signed)
"  I'd like to schedule my surgery.  I can't remember my doctor's name."  It is Dr. Amalia Hailey.  He does surgery on Thursdays.  Do you have a date in mind.  He can do it on April 11 to the end of the month.  "Let's schedule it for April 25."  I will get it scheduled.  Someone from the surgical center will call you and give you your arrival time a day or two prior to your surgery date.    (Please send the paperwork from the [place] office.)

## 2018-03-24 ENCOUNTER — Other Ambulatory Visit: Payer: Self-pay | Admitting: Internal Medicine

## 2018-03-25 ENCOUNTER — Encounter: Payer: Self-pay | Admitting: Podiatry

## 2018-03-25 DIAGNOSIS — M67471 Ganglion, right ankle and foot: Secondary | ICD-10-CM | POA: Diagnosis not present

## 2018-03-31 ENCOUNTER — Encounter: Payer: Self-pay | Admitting: Podiatry

## 2018-03-31 NOTE — Progress Notes (Signed)
DOS: 03-25-18 Excision of soft tissue mass right foot

## 2018-04-02 ENCOUNTER — Encounter: Payer: Self-pay | Admitting: Podiatry

## 2018-04-02 ENCOUNTER — Ambulatory Visit (INDEPENDENT_AMBULATORY_CARE_PROVIDER_SITE_OTHER): Admitting: Podiatry

## 2018-04-02 VITALS — BP 138/77 | HR 83 | Temp 98.8°F

## 2018-04-02 DIAGNOSIS — D492 Neoplasm of unspecified behavior of bone, soft tissue, and skin: Secondary | ICD-10-CM

## 2018-04-02 DIAGNOSIS — Z9889 Other specified postprocedural states: Secondary | ICD-10-CM

## 2018-04-05 NOTE — Progress Notes (Signed)
   Subjective:  Patient presents today status post excision of bursa of the right foot. DOS: 03/25/18. He states the incision looks well. He reports some bruising between the toes. He reports associated redness to the dorsum of the foot. He has been taking Celebrex and wearing the post op shoe for treatment. There are no modifying factors noted. Patient is here for further evaluation and treatment.    Past Medical History:  Diagnosis Date  . Cancer ([place])    basel cell skin cancer  . Depression   . GERD (gastroesophageal reflux disease)   . Hyperlipidemia   . Hypertension   . Low testosterone       Objective/Physical Exam Neurovascular status intact.  Skin incisions appear to be well coapted with sutures and staples intact. No sign of infectious process noted. No dehiscence. No active bleeding noted. Moderate edema noted to the surgical extremity.   Assessment: 1. s/p excision of bursa of right foot. DOS: 03/25/18   Plan of Care:  1. Patient was evaluated. 2. Dressings changed.  3. Continue weightbearing in post op shoe.  4. Continue taking Celebrex for arthritis.  5. Return to clinic in 2 weeks for suture removal.    Edrick Kins, DPM [facility]  Dr. Edrick Kins, [place]                                        [place], [place] [address]                Office [phone]  Fax [phone]

## 2018-04-07 ENCOUNTER — Encounter: Payer: Self-pay | Admitting: Podiatry

## 2018-04-09 ENCOUNTER — Encounter: Admitting: Podiatry

## 2018-04-16 ENCOUNTER — Ambulatory Visit (INDEPENDENT_AMBULATORY_CARE_PROVIDER_SITE_OTHER): Admitting: Podiatry

## 2018-04-16 ENCOUNTER — Encounter: Payer: Self-pay | Admitting: Podiatry

## 2018-04-16 ENCOUNTER — Other Ambulatory Visit: Payer: Self-pay

## 2018-04-16 DIAGNOSIS — Z9889 Other specified postprocedural states: Secondary | ICD-10-CM

## 2018-04-16 DIAGNOSIS — D492 Neoplasm of unspecified behavior of bone, soft tissue, and skin: Secondary | ICD-10-CM

## 2018-04-19 NOTE — Progress Notes (Signed)
   Subjective:  Patient presents today status post excision of bursa of the right foot. DOS: 03/25/18. He states he is doing well. He denies any pain. He denies any new complaints at this time. Patient is here for further evaluation and treatment.    Past Medical History:  Diagnosis Date  . Cancer ([place])    basel cell skin cancer  . Depression   . GERD (gastroesophageal reflux disease)   . Hyperlipidemia   . Hypertension   . Low testosterone       Objective/Physical Exam Neurovascular status intact.  Skin incisions appear to be well coapted with sutures and staples intact. No sign of infectious process noted. No dehiscence. No active bleeding noted. Moderate edema noted to the surgical extremity.   Assessment: 1. s/p excision of bursa of right foot. DOS: 03/25/18   Plan of Care:  1. Patient was evaluated. 2. Sutures removed.  3. Dry sterile dressing applied.  4. Resume wearing good shoe gear.  5. Return to clinic in 3 weeks.    Edrick Kins, DPM [facility]  Dr. Edrick Kins, [place]                                        [place], [place] [address]                Office [phone]  Fax [phone]

## 2018-05-07 ENCOUNTER — Encounter: Payer: Self-pay | Admitting: Podiatry

## 2018-05-07 ENCOUNTER — Ambulatory Visit (INDEPENDENT_AMBULATORY_CARE_PROVIDER_SITE_OTHER): Payer: Medicare Other | Admitting: Podiatry

## 2018-05-07 DIAGNOSIS — D492 Neoplasm of unspecified behavior of bone, soft tissue, and skin: Secondary | ICD-10-CM

## 2018-05-07 DIAGNOSIS — Z9889 Other specified postprocedural states: Secondary | ICD-10-CM

## 2018-05-11 DIAGNOSIS — E291 Testicular hypofunction: Secondary | ICD-10-CM | POA: Diagnosis not present

## 2018-05-11 DIAGNOSIS — R972 Elevated prostate specific antigen [PSA]: Secondary | ICD-10-CM | POA: Diagnosis not present

## 2018-05-11 DIAGNOSIS — D480 Neoplasm of uncertain behavior of bone and articular cartilage: Secondary | ICD-10-CM | POA: Diagnosis not present

## 2018-05-11 DIAGNOSIS — Z125 Encounter for screening for malignant neoplasm of prostate: Secondary | ICD-10-CM | POA: Diagnosis not present

## 2018-05-11 NOTE — Progress Notes (Signed)
   Subjective:  Patient presents today status post excision of bursa of the right foot. DOS: 03/25/18. He states he is doing well. He reports excessive dry skin near the incision. Patient is here for further evaluation and treatment.    Past Medical History:  Diagnosis Date  . Cancer ([place])    basel cell skin cancer  . Depression   . GERD (gastroesophageal reflux disease)   . Hyperlipidemia   . Hypertension   . Low testosterone     Objective: Physical Exam General: The patient is alert and oriented x3 in no acute distress.  Dermatology: Skin is cool, dry and supple bilateral lower extremities. Negative for open lesions or macerations.  Vascular: Palpable pedal pulses bilaterally. No edema or erythema noted. Capillary refill within normal limits.  Neurological: Epicritic and protective threshold grossly intact bilaterally.   Musculoskeletal Exam: All pedal and ankle joints range of motion within normal limits bilateral. Muscle strength 5/5 in all groups bilateral.     Assessment: 1. s/p excision of bursa of right foot. DOS: 03/25/18 - resolved   Plan of Care:  1. Patient was evaluated. 2. Recommended good shoe gear.  3. Return to clinic as needed.     Edrick Kins, DPM [facility]  Dr. Edrick Kins, [place]                                        [place], [place] [address]                Office [phone]  Fax [phone]

## 2018-05-27 DIAGNOSIS — H35033 Hypertensive retinopathy, bilateral: Secondary | ICD-10-CM | POA: Diagnosis not present

## 2018-06-01 ENCOUNTER — Other Ambulatory Visit: Payer: Self-pay | Admitting: Internal Medicine

## 2018-06-01 DIAGNOSIS — D485 Neoplasm of uncertain behavior of skin: Secondary | ICD-10-CM | POA: Diagnosis not present

## 2018-06-01 DIAGNOSIS — D2272 Melanocytic nevi of left lower limb, including hip: Secondary | ICD-10-CM | POA: Diagnosis not present

## 2018-06-01 DIAGNOSIS — D225 Melanocytic nevi of trunk: Secondary | ICD-10-CM | POA: Diagnosis not present

## 2018-06-01 DIAGNOSIS — Z85828 Personal history of other malignant neoplasm of skin: Secondary | ICD-10-CM | POA: Diagnosis not present

## 2018-06-01 DIAGNOSIS — D2271 Melanocytic nevi of right lower limb, including hip: Secondary | ICD-10-CM | POA: Diagnosis not present

## 2018-06-01 DIAGNOSIS — D2262 Melanocytic nevi of left upper limb, including shoulder: Secondary | ICD-10-CM | POA: Diagnosis not present

## 2018-06-01 DIAGNOSIS — C44619 Basal cell carcinoma of skin of left upper limb, including shoulder: Secondary | ICD-10-CM | POA: Diagnosis not present

## 2018-06-01 DIAGNOSIS — Z08 Encounter for follow-up examination after completed treatment for malignant neoplasm: Secondary | ICD-10-CM | POA: Diagnosis not present

## 2018-06-01 DIAGNOSIS — D2261 Melanocytic nevi of right upper limb, including shoulder: Secondary | ICD-10-CM | POA: Diagnosis not present

## 2018-06-04 ENCOUNTER — Telehealth: Payer: Self-pay | Admitting: Internal Medicine

## 2018-06-04 NOTE — Telephone Encounter (Signed)
Patient requesting to restart testosterone injection ok to refill and schedule?

## 2018-06-05 NOTE — Telephone Encounter (Signed)
No, absolutely not.  patient had elevated PSA last fall,  Was referred to Urology,  Apparently he never had the appt at [facility]. I will not refill the testosterone until he has had Urology evaluation

## 2018-06-07 ENCOUNTER — Encounter: Payer: Self-pay | Admitting: Internal Medicine

## 2018-06-07 NOTE — Telephone Encounter (Signed)
Patient calling and states that he had a urologist appointment with Dr Darcus Austin who is a urologist/obcologist. States his appointment was on 05/11/18. CB#: [phone] In a meeting from 3p-4p. Requests calling afterwards

## 2018-06-07 NOTE — Telephone Encounter (Signed)
Patient notified and stated he will call urology and schedule.

## 2018-06-07 NOTE — Telephone Encounter (Addendum)
Patient reporting Urology consult with Dr. Darcus Austin on 05/11/18 for elevated PSA. Patient says he is Urology- Oncology.

## 2018-06-07 NOTE — Telephone Encounter (Signed)
Ge[place],  Now I see dr Merilynn Finland note,  Dr Darcus Austin  authorized and sent in a prescription for the testosterone to patient's pharmacy  , and patient is supposed to come in here to have injection done every 2 weeks . Marland Kitchen  That  He will need to have quarterly labs as well

## 2018-06-08 NOTE — Telephone Encounter (Signed)
Patient scheduled.

## 2018-06-10 ENCOUNTER — Ambulatory Visit (INDEPENDENT_AMBULATORY_CARE_PROVIDER_SITE_OTHER): Payer: Medicare Other

## 2018-06-10 ENCOUNTER — Other Ambulatory Visit: Payer: Self-pay | Admitting: Internal Medicine

## 2018-06-10 DIAGNOSIS — E291 Testicular hypofunction: Secondary | ICD-10-CM

## 2018-06-10 MED ORDER — DULOXETINE HCL 20 MG PO CPEP: 20.0000 mg | ORAL_CAPSULE | Freq: Every day | ORAL | 0 refills | Status: DC

## 2018-06-10 MED ORDER — TESTOSTERONE CYPIONATE 200 MG/ML IM SOLN
200.0000 mg | Freq: Once | INTRAMUSCULAR | Status: AC
  Administered 2018-06-10: 200 mg via INTRAMUSCULAR

## 2018-06-10 NOTE — Progress Notes (Signed)
Patient BP checked and discussed with patient to check BP periodically and any problems please call office. Will talk with PCP tomorrow spoke with Dr. Nicki Reaper regarding dosage given of testosterone and made patient aware. Patient BP 144/80 pulse 72.

## 2018-06-10 NOTE — Progress Notes (Signed)
Patient comes in today for a testosterone injection. Administered in upper left gluteal IM. Patient tolerated well.

## 2018-06-21 ENCOUNTER — Encounter: Payer: Self-pay | Admitting: Internal Medicine

## 2018-06-23 ENCOUNTER — Ambulatory Visit: Payer: Medicare Other

## 2018-07-08 ENCOUNTER — Other Ambulatory Visit: Payer: Self-pay | Admitting: Internal Medicine

## 2018-07-08 ENCOUNTER — Ambulatory Visit (INDEPENDENT_AMBULATORY_CARE_PROVIDER_SITE_OTHER): Payer: Medicare Other

## 2018-07-08 DIAGNOSIS — R7989 Other specified abnormal findings of blood chemistry: Secondary | ICD-10-CM

## 2018-07-08 DIAGNOSIS — E291 Testicular hypofunction: Secondary | ICD-10-CM

## 2018-07-08 NOTE — Progress Notes (Signed)
Patient given testosterone injection in left ventrogluteal. Patient tolerated well

## 2018-07-11 NOTE — Progress Notes (Signed)
  I have reviewed the above information and agree with above.   Bradley Handyside, MD 

## 2018-07-12 DIAGNOSIS — G4733 Obstructive sleep apnea (adult) (pediatric): Secondary | ICD-10-CM

## 2018-07-12 DIAGNOSIS — I10 Essential (primary) hypertension: Secondary | ICD-10-CM

## 2018-07-12 DIAGNOSIS — H43811 Vitreous degeneration, right eye: Secondary | ICD-10-CM | POA: Diagnosis not present

## 2018-07-12 DIAGNOSIS — E782 Mixed hyperlipidemia: Secondary | ICD-10-CM

## 2018-07-12 DIAGNOSIS — Z125 Encounter for screening for malignant neoplasm of prostate: Secondary | ICD-10-CM

## 2018-07-12 DIAGNOSIS — R5383 Other fatigue: Secondary | ICD-10-CM

## 2018-07-12 DIAGNOSIS — E559 Vitamin D deficiency, unspecified: Secondary | ICD-10-CM

## 2018-07-12 DIAGNOSIS — R7301 Impaired fasting glucose: Secondary | ICD-10-CM

## 2018-07-13 NOTE — Telephone Encounter (Signed)
Is there anything else that needs to be ordered for lab work before his appt on 07/28/2018? I ordered CBC, CMP, TSH, Vitamin D, Lipid, A1C, PSA.

## 2018-07-14 NOTE — Telephone Encounter (Signed)
Lab appt has been scheduled for 07/22/2018 @ 8:45am. Pt is aware of appt date and time.

## 2018-07-20 DIAGNOSIS — Z9689 Presence of other specified functional implants: Secondary | ICD-10-CM | POA: Diagnosis not present

## 2018-07-20 DIAGNOSIS — M5417 Radiculopathy, lumbosacral region: Secondary | ICD-10-CM | POA: Diagnosis not present

## 2018-07-20 DIAGNOSIS — M961 Postlaminectomy syndrome, not elsewhere classified: Secondary | ICD-10-CM | POA: Diagnosis not present

## 2018-07-22 ENCOUNTER — Encounter: Payer: Self-pay | Admitting: Physical Therapy

## 2018-07-22 ENCOUNTER — Ambulatory Visit: Payer: Medicare Other | Attending: Anesthesiology | Admitting: Physical Therapy

## 2018-07-22 ENCOUNTER — Ambulatory Visit (INDEPENDENT_AMBULATORY_CARE_PROVIDER_SITE_OTHER): Payer: Medicare Other

## 2018-07-22 ENCOUNTER — Other Ambulatory Visit (INDEPENDENT_AMBULATORY_CARE_PROVIDER_SITE_OTHER): Payer: Medicare Other

## 2018-07-22 DIAGNOSIS — Z125 Encounter for screening for malignant neoplasm of prostate: Secondary | ICD-10-CM | POA: Diagnosis not present

## 2018-07-22 DIAGNOSIS — I10 Essential (primary) hypertension: Secondary | ICD-10-CM

## 2018-07-22 DIAGNOSIS — E782 Mixed hyperlipidemia: Secondary | ICD-10-CM

## 2018-07-22 DIAGNOSIS — E559 Vitamin D deficiency, unspecified: Secondary | ICD-10-CM | POA: Diagnosis not present

## 2018-07-22 DIAGNOSIS — R5383 Other fatigue: Secondary | ICD-10-CM | POA: Diagnosis not present

## 2018-07-22 DIAGNOSIS — M545 Low back pain, unspecified: Secondary | ICD-10-CM

## 2018-07-22 DIAGNOSIS — R7989 Other specified abnormal findings of blood chemistry: Secondary | ICD-10-CM | POA: Diagnosis not present

## 2018-07-22 DIAGNOSIS — R7301 Impaired fasting glucose: Secondary | ICD-10-CM | POA: Diagnosis not present

## 2018-07-22 LAB — LIPID PANEL
CHOLESTEROL: 148 mg/dL (ref 0–200)
HDL: 39.8 mg/dL (ref >39.00)
LDL CALC: 77 mg/dL (ref 0–99)
NonHDL: 108.29
Total CHOL/HDL Ratio: 4
Triglycerides: 154 mg/dL — ABNORMAL HIGH (ref 0.0–149.0)
VLDL: 30.8 mg/dL (ref 0.0–40.0)

## 2018-07-22 LAB — CBC WITH DIFFERENTIAL/PLATELET
Basophils Absolute: 0 10*3/uL (ref 0.0–0.1)
Basophils Relative: 0.6 % (ref 0.0–3.0)
EOS ABS: 0.1 10*3/uL (ref 0.0–0.7)
EOS PCT: 2.4 % (ref 0.0–5.0)
HCT: 43.1 % (ref 39.0–52.0)
HEMOGLOBIN: 14.3 g/dL (ref 13.0–17.0)
Lymphocytes Relative: 32.4 % (ref 12.0–46.0)
Lymphs Abs: 1.7 10*3/uL (ref 0.7–4.0)
MCHC: 33 g/dL (ref 30.0–36.0)
MCV: 86.8 fl (ref 78.0–100.0)
MONO ABS: 0.4 10*3/uL (ref 0.1–1.0)
Monocytes Relative: 7 % (ref 3.0–12.0)
Neutro Abs: 3.1 10*3/uL (ref 1.4–7.7)
Neutrophils Relative %: 57.6 % (ref 43.0–77.0)
Platelets: 175 10*3/uL (ref 150.0–400.0)
RBC: 4.97 Mil/uL (ref 4.22–5.81)
RDW: 14.4 % (ref 11.5–15.5)
WBC: 5.3 10*3/uL (ref 4.0–10.5)

## 2018-07-22 LAB — COMPREHENSIVE METABOLIC PANEL
ALBUMIN: 4.3 g/dL (ref 3.5–5.2)
ALT: 16 U/L (ref 0–53)
AST: 14 U/L (ref 0–37)
Alkaline Phosphatase: 74 U/L (ref 39–117)
BILIRUBIN TOTAL: 0.7 mg/dL (ref 0.2–1.2)
BUN: 14 mg/dL (ref 6–23)
CALCIUM: 9.4 mg/dL (ref 8.4–10.5)
CHLORIDE: 104 meq/L (ref 96–112)
CO2: 29 meq/L (ref 19–32)
CREATININE: 1.11 mg/dL (ref 0.40–1.50)
GFR: 70.62 mL/min (ref >60.00)
Glucose, Bld: 110 mg/dL — ABNORMAL HIGH (ref 70–99)
Potassium: 4.4 mEq/L (ref 3.5–5.1)
Sodium: 139 mEq/L (ref 135–145)
Total Protein: 6.6 g/dL (ref 6.0–8.3)

## 2018-07-22 LAB — HEMOGLOBIN A1C: Hgb A1c MFr Bld: 6 % (ref 4.6–6.5)

## 2018-07-22 LAB — TSH: TSH: 1.58 u[IU]/mL (ref 0.35–4.50)

## 2018-07-22 LAB — PSA, MEDICARE: PSA: 3.73 ng/ml (ref 0.10–4.00)

## 2018-07-22 LAB — VITAMIN D 25 HYDROXY (VIT D DEFICIENCY, FRACTURES): VITD: 46.58 ng/mL (ref 30.00–100.00)

## 2018-07-22 MED ORDER — TESTOSTERONE CYPIONATE 200 MG/ML IM SOLN: 100.0000 mg | Freq: Once | INTRAMUSCULAR | Status: DC

## 2018-07-22 NOTE — Progress Notes (Signed)
Patient presented for Testosterone injection to right glute, patient voiced no concerns nor showed any signs of distress during injection. 

## 2018-07-22 NOTE — Therapy (Signed)
[facility] [address], [place], [address] Phone: [phone]   Fax:  [phone]  Physical Therapy Treatment  Patient Details  Name: Bobby Rich MRN: [id number] Date of Birth: 1951/05/21 Referring Provider: Barbarann Ehlers   Encounter Date: 07/22/2018  PT End of Session - 07/22/18 1714    Visit Number  1    Number of Visits  17    Date for PT Re-Evaluation  09/16/18    PT Start Time  0445    PT Stop Time  0545    PT Time Calculation (min)  60 min    Activity Tolerance  Patient tolerated treatment well    Behavior During Therapy  [facility] for tasks assessed/performed       Past Medical History:  Diagnosis Date  . Cancer ([place])    basel cell skin cancer  . Depression   . GERD (gastroesophageal reflux disease)   . Hyperlipidemia   . Hypertension   . Low testosterone     Past Surgical History:  Procedure Laterality Date  . COSMETIC SURGERY    . HERNIA REPAIR Bilateral    with mesh   . LUMBAR LAMINECTOMY  08/2016  . SPINE SURGERY  2012   [place] L3 4 5     There were no vitals filed for this visit.  Subjective Assessment - 07/22/18 1655    Subjective  LBP    Pertinent History  Patient is a 67 year old male presenting with LBP. Patient with hx of L3-5 decompression with laminectomy Sept 2017 with infection complication, with follow-up SCS implantation March 2019 d/t sciatica sx. Since SCS patient reports redicular/sciatica sx have diminished. Patient is reporting today for new onset of pain from the past 1.5 months that he reports has increased since he has increased physical activity with walking for exercise. Patient reports he has been walking 82min (41miles) 3x/week. Worst pain over the past week 4/10 with best pain 0/10 with rest. Patient reports most pain with transitional movements and pain with walking. Patient reports increased pain over implant site. Pt denies N/V, unexplained weight  fluctuation, saddle paresthesia, B&B changes. fever, night sweats, or unrelenting night pain at this time.    Limitations  Standing;Walking;Lifting;House hold activities    How long can you sit comfortably?  unlimited    How long can you stand comfortably?  59min    How long can you walk comfortably?  20min    Diagnostic tests  None yet    Patient Stated Goals  walk 50mi 5x/week without pain to increase physical activity    Currently in Pain?  Yes    Pain Score  4     Pain Location  Back    Pain Orientation  Right;Left    Pain Descriptors / Indicators  Aching;Dull;Nagging    Pain Type  Acute pain    Pain Radiating Towards  none    Pain Onset  1 to 4 weeks ago    Aggravating Factors   transitional movements, prolonged standing/ambulation, getting in and out of the car, "twisting"    Pain Relieving Factors  Rest    Effect of Pain on Daily Activities  unable to complete exercise regimen           ROM Trunk 50% limited actively with full rotation with over pressure Trunk flexion wnl Trunk ext not tested d/t laminectomy Trunk lateral bending wnl All hip motions wnl with fasical restrictions   Strength Hip flex:  5/5 bilat Hip abd 4+/5 Hip Ext in prone: 4+/5 bilat Hip IR: 5/5 bilat Hip ER: 4+/5 bilat     Special Tests/ Other Reflexes diminished bilat (-) slump bilat (-) SLR (-) Crossed SLR (+) Elys on L for hip flexor tightness (+) 90/90 bilat (-) FABER bilat (+) FADIR bilat (+) Obers bilat (-) Scour bilat 5xSTS 14sec 6MWT 1736ft with 3/10 pain Lateral incision to L of L1 with increased scar tissue vertical 1in scar at L3-4 and R of T12 area with minimal scar tissue   Ther-Ex - Modified lateral childs pose  30sec holds (HEP) - Seated glute stretch (HEP) - Post pelvic tilt x10  (HEP) - Bridge x10  (HEP) -Education on PT role and there-ex role on pain and strengthening muscles surrounding the spine to increase stability and decrease  pain                              PT Education - 07/22/18 1704    Education Details  Patient was educated on diagnosis, anatomy and pathology involved, prognosis, role of PT, and was given an HEP, demonstrating exercise with proper form following verbal and tactile cues, and was given a paper hand out to continue exercise at home. Pt was educated on and agreed to plan of care.    Person(s) Educated  Patient    Methods  Explanation;Demonstration;Tactile cues;Verbal cues;Handout    Comprehension  Verbalized understanding;Returned demonstration;Verbal cues required;Tactile cues required;Need further instruction       PT Short Term Goals - 07/23/18 0929      PT SHORT TERM GOAL #1   Title  Pt will be independent with HEP in order to improve strength and flexibility in order to decrease fall risk and improve function at home and work.    Time  4    Period  Weeks    Status  New        PT Long Term Goals - 07/23/18 0930      PT LONG TERM GOAL #1   Title  Patient will complete 6MWT ambulating at least 1722ft with 0/10 pain to return to PLOF    Baseline  07/22/18: 1771ft with 3/10 pain    Time  8    Period  Weeks    Status  New      PT LONG TERM GOAL #2   Title  Pt will decrease 5TSTS by at least 3 seconds in order to demonstrate clinically significant improvement in LE strength    Baseline  07/22/18: 14sec    Time  8    Period  Weeks    Status  New      PT LONG TERM GOAL #3   Title  Patient will increase FOTO score to to demonstrate predicted increase in functional mobility to complete ADLs            Plan - 07/23/18 1021    Clinical Impression Statement  Pt is a 67 year old male presenting with LBP following SCS implant March 2019, and increasing activity over the past 3 weeks (beginning walking 85mins, 3x/week). Impairments including LBP, decreased endurance, decreased bilat hip and core strength. 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 Patient will benefit from skilled PT to address these impairments to return to PLOF.  History and Personal Factors relevant to plan of care:  L2-5 decompression with laminectomy, thoracic ant disc buldge (pt reported), SCS implant    Clinical Presentation  Evolving    Clinical Presentation due to:  2 personal factors/comorbidities, 3 body systems/activity limitations/participation restrictions    Clinical Decision Making  Moderate    Rehab Potential  Good    Clinical Impairments Affecting Rehab Potential  (-) multiple spine comorbidities, chronicity of pain, obesity, sedentary lifestyle (+) motivation to increase activity level, social support    PT Frequency  2x / week    PT Duration  8 weeks    PT Treatment/Interventions  Passive range of motion;Neuromuscular re-education;Gait training;Stair training;Moist Heat;Traction;Cryotherapy;Therapeutic exercise;Balance training;Therapeutic activities;Patient/family education;Functional mobility training;Manual techniques;Dry needling    PT Next Visit Plan  FOTO; HEP and goal review, core stability training    PT Home Exercise Plan  post pelvic tilt, bridge, glute stretch, lateral childs pose    Consulted and Agree with Plan of Care  Patient       Patient will benefit from skilled therapeutic intervention in order to improve the following deficits and impairments:  Improper body mechanics, Impaired sensation, Pain, Increased fascial restricitons, Increased muscle spasms, Postural dysfunction, Decreased activity tolerance, Impaired flexibility, Decreased range of motion, Difficulty walking  Visit Diagnosis: Acute bilateral low back pain without sciatica     Problem List Patient Active Problem List   Diagnosis Date Noted  . Spinal cord stimulator status 02/19/2018  . Elevated PSA, less than 10  ng/ml 07/25/2017  . 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 Lumbar spine instability 01/19/2017  . 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 Loss of libido 07/13/2016  . Sleep apnea, obstructive 07/13/2016  . Chronic pain syndrome 07/13/2016  . Mixed emotional features as adjustment reaction 07/03/2016  . Overweight (BMI 25.0-29.9) 06/30/2015  . L-S radiculopathy 09/21/2014  . HTN (hypertension) 02/14/2014  . HZV (herpes zoster virus) post herpetic neuralgia 05/27/2013  . Visit for preventive health examination 05/26/2013  . Male erectile dysfunction 01/29/2013  . GERD (gastroesophageal reflux disease)   . Hypogonadism in male   . Hyperlipidemia   . Depression    Shelton Silvas PT, DPT  Shelton Silvas 07/23/2018, 10:34 AM  [facility] [address], [place], [address] Phone: [phone]   Fax:  [phone]  Name: Bobby Rich MRN: [id number] Date of Birth: [birth year]

## 2018-07-26 DIAGNOSIS — M961 Postlaminectomy syndrome, not elsewhere classified: Secondary | ICD-10-CM | POA: Diagnosis not present

## 2018-07-28 ENCOUNTER — Encounter: Admitting: Internal Medicine

## 2018-08-03 NOTE — Progress Notes (Signed)
  I have reviewed the above information and agree with above.   [name], MD 

## 2018-08-11 ENCOUNTER — Ambulatory Visit: Payer: Medicare Other

## 2018-08-16 ENCOUNTER — Encounter: Payer: Self-pay | Admitting: Physical Therapy

## 2018-08-16 ENCOUNTER — Ambulatory Visit: Payer: Medicare Other | Attending: Anesthesiology | Admitting: Physical Therapy

## 2018-08-16 DIAGNOSIS — M545 Low back pain, unspecified: Secondary | ICD-10-CM

## 2018-08-16 NOTE — Therapy (Signed)
[facility] [address], [place], [address] Phone: [phone]   Fax:  [phone]  Physical Therapy Treatment  Patient Details  Name: Bobby Rich MRN: [id number] Date of Birth: Aug 01, 1951 Referring Provider: Barbarann Ehlers   Encounter Date: 08/16/2018  PT End of Session - 08/16/18 0847    Visit Number  2    Number of Visits  17    Date for PT Re-Evaluation  09/16/18    PT Start Time  0818    PT Stop Time  0900    PT Time Calculation (min)  42 min    Activity Tolerance  Patient tolerated treatment well    Behavior During Therapy  [facility] for tasks assessed/performed       Past Medical History:  Diagnosis Date  . Cancer ([place])    basel cell skin cancer  . Depression   . GERD (gastroesophageal reflux disease)   . Hyperlipidemia   . Hypertension   . Low testosterone     Past Surgical History:  Procedure Laterality Date  . COSMETIC SURGERY    . HERNIA REPAIR Bilateral    with mesh   . LUMBAR LAMINECTOMY  08/2016  . SPINE SURGERY  2012   [place] L3 4 5     There were no vitals filed for this visit.  Subjective Assessment - 08/16/18 0820    Subjective  Patient arrives back to PT following 3 week absence for work/vacation. Patient reports increased soreness from yard work over the weekend, where he reports "a lot of bending". Patient reports 4/10 pain today. Patient reports compliance with his HEP.     Pertinent History  Patient is a 67 year old male presenting with LBP. Patient with hx of L3-5 decompression with laminectomy Sept 2017 with infection complication, with follow-up SCS implantation March 2019 d/t sciatica sx. Since SCS patient reports redicular/sciatica sx have diminished. Patient is reporting today for new onset of pain from the past 1.5 months that he reports has increased since he has increased physical activity with walking for exercise. Patient reports he has been walking 63min (61miles)  3x/week. Worst pain over the past week 4/10 with best pain 0/10 with rest. Patient reports most pain with transitional movements and pain with walking. Patient reports increased pain over implant site. Pt denies N/V, unexplained weight fluctuation, saddle paresthesia, B&B changes. fever, night sweats, or unrelenting night pain at this time.    Limitations  Standing;Walking;Lifting;House hold activities    How long can you sit comfortably?  unlimited    How long can you stand comfortably?  12min    How long can you walk comfortably?  83min    Diagnostic tests  None yet    Pain Onset  1 to 4 weeks ago       Ther-Ex - Supine glute stretch 30sec hold each side - Post pelvic tilt x10 2-3 sec hold with good carry over from evaluation - Bridge x 10 with min cuing for proper form; x10 with red tband at knees  for added resistance (HEP update)  - Childs pose with R and L lateral bias x30sec hold with cuing to complete hold for stretch.  - During 72min heat pack: video and verbal explanation of proper lifting form for yard work with squat, lateral squat and golfer pick up, with education to keep items close to your body and utilize glutes with core active to decrease strain on LB. Following, squat demo from patient  with proper form following visual demo with good carry over over 15 reps with mat table for TC cuing -Education on self massage to trigger points with tennis ball   Manual STM with trigger point release to bilat lumbar paraspinals and lower thoracic paraspinals                       PT Education - 08/16/18 0847    Education Details  Lifting mechanics    Person(s) Educated  Patient    Methods  Explanation;Verbal cues    Comprehension  Verbalized understanding;Verbal cues required       PT Short Term Goals - 07/23/18 0929      PT SHORT TERM GOAL #1   Title  Pt will be independent with HEP in order to improve strength and flexibility in order to decrease fall risk  and improve function at home and work.    Time  4    Period  Weeks    Status  New        PT Long Term Goals - 07/23/18 0930      PT LONG TERM GOAL #1   Title  Patient will complete 6MWT ambulating at least 1755ft with 0/10 pain to return to PLOF    Baseline  07/22/18: 1721ft with 3/10 pain    Time  8    Period  Weeks    Status  New      PT LONG TERM GOAL #2   Title  Pt will decrease 5TSTS by at least 3 seconds in order to demonstrate clinically significant improvement in LE strength    Baseline  07/22/18: 14sec    Time  8    Period  Weeks    Status  New      PT LONG TERM GOAL #3   Title  Patient will increase FOTO score to to demonstrate predicted increase in functional mobility to complete ADLs            Plan - 08/16/18 1207    Clinical Impression Statement  Patient is demonstrating good carry over of HEP following prolonged absence from PT following a 3 week work trip. Patient reports increased pain/soreness in lumbar paraspinals following moving bags of dirt and shoveling in his yard. PT spent time educating patient on proper lifting mechanics, which patient verbalized and demonstrated understanding of. Patient with decreased tension following manual techniques. Patient reports he has a self-massage device that he would like to bring next week to see it this will improve muscle tension at home/increase carry over between sessions.     Rehab Potential  Good    Clinical Impairments Affecting Rehab Potential  (-) multiple spine comorbidities, chronicity of pain, obesity, sedentary lifestyle (+) motivation to increase activity level, social support    PT Frequency  2x / week    PT Duration  8 weeks    PT Treatment/Interventions  Passive range of motion;Neuromuscular re-education;Gait training;Stair training;Moist Heat;Traction;Cryotherapy;Therapeutic exercise;Balance training;Therapeutic activities;Patient/family education;Functional mobility training;Manual techniques;Dry  needling    PT Next Visit Plan  FOTO; HEP and goal review, core stability training    PT Home Exercise Plan  post pelvic tilt, bridge, glute stretch, lateral childs pose    Consulted and Agree with Plan of Care  Patient       Patient will benefit from skilled therapeutic intervention in order to improve the following deficits and impairments:  Improper body mechanics, Impaired sensation, Pain, Increased fascial restricitons, Increased muscle spasms, Postural  dysfunction, Decreased activity tolerance, Impaired flexibility, Decreased range of motion, Difficulty walking  Visit Diagnosis: Acute bilateral low back pain without sciatica     Problem List Patient Active Problem List   Diagnosis Date Noted  . Spinal cord stimulator status 02/19/2018  . Elevated PSA, less than 10 ng/ml 07/25/2017  . 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 Lumbar spine instability 01/19/2017  . 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 Loss of libido 07/13/2016  . Sleep apnea, obstructive 07/13/2016  . Chronic pain syndrome 07/13/2016  . Mixed emotional features as adjustment reaction 07/03/2016  . Overweight (BMI 25.0-29.9) 06/30/2015  . L-S radiculopathy 09/21/2014  . HTN (hypertension) 02/14/2014  . HZV (herpes zoster virus) post herpetic neuralgia 05/27/2013  . Visit for preventive health examination 05/26/2013  . Male erectile dysfunction 01/29/2013  . GERD (gastroesophageal reflux disease)   . Hypogonadism in male   . Hyperlipidemia   . Depression    Shelton Silvas PT, DPT  Jaryn Rosko 08/16/2018, 12:23 PM  [facility] [address], [place], [address] Phone: [phone]   Fax:  [phone]  Name: Bobby Rich MRN: [id number] Date of Birth: 1951/07/19

## 2018-08-17 ENCOUNTER — Telehealth: Payer: Self-pay

## 2018-08-17 NOTE — Telephone Encounter (Signed)
Pt is wanting to know if he can bring his lab orders in tomorrow when he comes for nurse visit and have them drawn then?

## 2018-08-17 NOTE — Telephone Encounter (Signed)
Yes, the lab can run them under my name

## 2018-08-17 NOTE — Telephone Encounter (Signed)
Copied from [place] [id number]. Topic: Inquiry >> Aug 17, 2018  8:23 AM Conception Chancy, NT wrote: Reason for CRM: patient is calling and states he sees his Urologist later this week and he is requesting 3 lab orders to be drawn. Patient has a nurse visit tomorrow and would like to know can he fax the orders or bring them in and have labs drawn at the same time. Please contact.

## 2018-08-18 ENCOUNTER — Telehealth: Payer: Self-pay

## 2018-08-18 ENCOUNTER — Ambulatory Visit (INDEPENDENT_AMBULATORY_CARE_PROVIDER_SITE_OTHER): Payer: Medicare Other | Admitting: *Deleted

## 2018-08-18 DIAGNOSIS — Z125 Encounter for screening for malignant neoplasm of prostate: Secondary | ICD-10-CM

## 2018-08-18 DIAGNOSIS — E291 Testicular hypofunction: Secondary | ICD-10-CM

## 2018-08-18 DIAGNOSIS — Z23 Encounter for immunization: Secondary | ICD-10-CM | POA: Diagnosis not present

## 2018-08-18 LAB — CBC WITH DIFFERENTIAL/PLATELET
BASOS PCT: 0.5 % (ref 0.0–3.0)
Basophils Absolute: 0 10*3/uL (ref 0.0–0.1)
EOS ABS: 0.1 10*3/uL (ref 0.0–0.7)
Eosinophils Relative: 2.3 % (ref 0.0–5.0)
HEMATOCRIT: 43.6 % (ref 39.0–52.0)
HEMOGLOBIN: 14.5 g/dL (ref 13.0–17.0)
LYMPHS PCT: 29.4 % (ref 12.0–46.0)
Lymphs Abs: 1.6 10*3/uL (ref 0.7–4.0)
MCHC: 33.2 g/dL (ref 30.0–36.0)
MCV: 86.3 fl (ref 78.0–100.0)
Monocytes Absolute: 0.3 10*3/uL (ref 0.1–1.0)
Monocytes Relative: 5.3 % (ref 3.0–12.0)
Neutro Abs: 3.3 10*3/uL (ref 1.4–7.7)
Neutrophils Relative %: 62.5 % (ref 43.0–77.0)
Platelets: 194 10*3/uL (ref 150.0–400.0)
RBC: 5.05 Mil/uL (ref 4.22–5.81)
RDW: 14.4 % (ref 11.5–15.5)
WBC: 5.3 10*3/uL (ref 4.0–10.5)

## 2018-08-18 LAB — PSA: PSA: 4.9 ng/mL — AB (ref 0.10–4.00)

## 2018-08-18 MED ORDER — TESTOSTERONE CYPIONATE 200 MG/ML IM SOLN
100.0000 mg | INTRAMUSCULAR | Status: DC
  Administered 2018-08-18 – 2019-04-28 (×2): 100 mg via INTRAMUSCULAR

## 2018-08-18 NOTE — Progress Notes (Addendum)
Patient presented for testosterone injection to left upper outter quadrant.Patient tolerated injection well. Patient supplied testosterone.

## 2018-08-18 NOTE — Telephone Encounter (Signed)
Please sign off on labs.

## 2018-08-19 NOTE — Telephone Encounter (Signed)
Labs were ordered and drawn

## 2018-08-20 ENCOUNTER — Encounter: Payer: Self-pay | Admitting: Physical Therapy

## 2018-08-20 ENCOUNTER — Ambulatory Visit: Payer: Medicare Other | Admitting: Physical Therapy

## 2018-08-20 DIAGNOSIS — M545 Low back pain, unspecified: Secondary | ICD-10-CM

## 2018-08-20 DIAGNOSIS — R972 Elevated prostate specific antigen [PSA]: Secondary | ICD-10-CM | POA: Diagnosis not present

## 2018-08-20 DIAGNOSIS — E291 Testicular hypofunction: Secondary | ICD-10-CM | POA: Diagnosis not present

## 2018-08-20 DIAGNOSIS — F4323 Adjustment disorder with mixed anxiety and depressed mood: Secondary | ICD-10-CM | POA: Diagnosis not present

## 2018-08-20 DIAGNOSIS — N5203 Combined arterial insufficiency and corporo-venous occlusive erectile dysfunction: Secondary | ICD-10-CM | POA: Diagnosis not present

## 2018-08-20 DIAGNOSIS — N401 Enlarged prostate with lower urinary tract symptoms: Secondary | ICD-10-CM | POA: Diagnosis not present

## 2018-08-20 LAB — TESTOSTERONE TOTAL,FREE,BIO, MALES
ALBUMIN MSPROF: 4.5 g/dL (ref 3.6–5.1)
Sex Hormone Binding: 23 nmol/L (ref 22–77)
Testosterone: 120 ng/dL — ABNORMAL LOW (ref 250–827)

## 2018-08-20 NOTE — Therapy (Signed)
[facility] [address] [place], [address] Phone: [phone]   Fax:  [phone]  Physical Therapy Treatment  Patient Details  Name: Bobby Rich MRN: [id number] Date of Birth: [birth year] Referring Provider: Barbarann Rich   Encounter Date: 08/20/2018  PT End of Session - 08/20/18 0812    Visit Number  3    Number of Visits  17    Date for PT Re-Evaluation  09/16/18    PT Start Time  0800    PT Stop Time  0849    PT Time Calculation (min)  49 min    Activity Tolerance  Patient tolerated treatment well    Behavior During Therapy  [facility] for tasks assessed/performed       Past Medical History:  Diagnosis Date  . Cancer ([place])    basel cell skin cancer  . Depression   . GERD (gastroesophageal reflux disease)   . Hyperlipidemia   . Hypertension   . Low testosterone     Past Surgical History:  Procedure Laterality Date  . COSMETIC SURGERY    . HERNIA REPAIR Bilateral    with mesh   . LUMBAR LAMINECTOMY  08/2016  . SPINE SURGERY  2012   [facility] L3 4 5     There were no vitals filed for this visit.  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 Patient reports his pain on the L side of his LB was 4/10 before going to bed following this. Patient reports 2/10 pain this morning.     Pertinent History  Patient is a 67 year old male presenting with LBP. Patient with hx of L3-5 decompression with laminectomy Sept 2017 with infection complication, with follow-up SCS implantation March 2019 d/t sciatica sx. Since SCS patient reports redicular/sciatica sx have diminished. Patient is reporting today for new onset of pain from the past 1.5 months that he reports has increased since he has increased physical activity with walking for exercise. Patient reports he has been walking 28min (5miles) 3x/week. Worst  pain over the past week 4/10 with best pain 0/10 with rest. Patient reports most pain with transitional movements and pain with walking. Patient reports increased pain over implant site. Pt denies N/V, unexplained weight fluctuation, saddle paresthesia, B&B changes. fever, night sweats, or unrelenting night pain at this time.    Limitations  Standing;Walking;Lifting;House hold activities    How long can you sit comfortably?  unlimited    How long can you stand comfortably?  64min    How long can you walk comfortably?  60min    Diagnostic tests  None yet    Patient Stated Goals  walk 22mi 5x/week without pain to increase physical activity    Pain Onset  1 to 4 weeks ago       Ther-Ex - Bridge 3x 10 with min cuing for proper form - TA marches from 4in step 3x 8 (each LE) with demo and cuing initially for maintained core contraction with good carry over following - Sidestepping over 45ft (27ft down/39ft back) with min cuing initially for eccentric control with good carry over following - Palloff press 10# 3x 10 with demo and cuing initially with good carry over following - During 62min heat pack: during discussed purpose of relieving tension/overactivation of hamstrings/smaller glute musculature and imrpoving strength/activation of prime movers (glute max) to correct aberent  movement patterns   Manual STM with trigger point release to L glute max/min/piriformis (patient c/o pain with increased tension from last session with 75% relief following manual techniques)                        PT Education - 08/20/18 0812    Education Details  Exercise form    Person(s) Educated  Patient    Methods  Explanation;Verbal cues    Comprehension  Verbalized understanding;Verbal cues required       PT Short Term Goals - 07/23/18 0929      PT SHORT TERM GOAL #1   Title  Pt will be independent with HEP in order to improve strength and flexibility in order to decrease fall risk and  improve function at home and work.    Time  4    Period  Weeks    Status  New        PT Long Term Goals - 07/23/18 0930      PT LONG TERM GOAL #1   Title  Patient will complete 6MWT ambulating at least 172ft with 0/10 pain to return to PLOF    Baseline  07/22/18: 1729ft with 3/10 pain    Time  8    Period  Weeks    Status  New      PT LONG TERM GOAL #2   Title  Pt will decrease 5TSTS by at least 3 seconds in order to demonstrate clinically significant improvement in LE strength    Baseline  07/22/18: 14sec    Time  8    Period  Weeks    Status  New      PT LONG TERM GOAL #3   Title  Patient will increase FOTO score to to demonstrate predicted increase in functional mobility to complete ADLs            Plan - 08/20/18 0855    Clinical Impression Statement  Patient is continuing to demonstrate good carry over between sessions allowing for increased progression of therex. PT continued to utilize manual techniques to relieve tension in glute musculature which patient reports pain relief following.     Rehab Potential  Good    Clinical Impairments Affecting Rehab Potential  (-) multiple spine comorbidities, chronicity of pain, obesity, sedentary lifestyle (+) motivation to increase activity level, social support    PT Frequency  2x / week    PT Duration  8 weeks    PT Treatment/Interventions  Passive range of motion;Neuromuscular re-education;Gait training;Stair training;Moist Heat;Traction;Cryotherapy;Therapeutic exercise;Balance training;Therapeutic activities;Patient/family education;Functional mobility training;Manual techniques;Dry needling    PT Next Visit Plan  HEP and goal review, core stability training    PT Home Exercise Plan  post pelvic tilt, bridge, glute stretch, lateral childs pose    Consulted and Agree with Plan of Care  Patient       Patient will benefit from skilled therapeutic intervention in order to improve the following deficits and impairments:   Improper body mechanics, Impaired sensation, Pain, Increased fascial restricitons, Increased muscle spasms, Postural dysfunction, Decreased activity tolerance, Impaired flexibility, Decreased range of motion, Difficulty walking  Visit Diagnosis: Acute bilateral low back pain without sciatica     Problem List Patient Active Problem List   Diagnosis Date Noted  . Spinal cord stimulator status 02/19/2018  . Elevated PSA, less than 10 ng/ml 07/25/2017  . 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 Lumbar spine instability 01/19/2017  . Tinnitus aurium,  left 01/13/2017  . Loss of libido 07/13/2016  . Sleep apnea, obstructive 07/13/2016  . Chronic pain syndrome 07/13/2016  . Mixed emotional features as adjustment reaction 07/03/2016  . Overweight (BMI 25.0-29.9) 06/30/2015  . L-S radiculopathy 09/21/2014  . HTN (hypertension) 02/14/2014  . HZV (herpes zoster virus) post herpetic neuralgia 05/27/2013  . Visit for preventive health examination 05/26/2013  . Male erectile dysfunction 01/29/2013  . GERD (gastroesophageal reflux disease)   . Hypogonadism in male   . Hyperlipidemia   . Depression    Shelton Silvas PT, DPT  Shelton Silvas 08/20/2018, 9:07 AM  [facility] [address], [place], [address] Phone: [phone]   Fax:  [phone]  Name: Bobby Rich MRN: [id number] Date of Birth: [birth year]

## 2018-08-23 ENCOUNTER — Encounter: Payer: Medicare Other | Admitting: Physical Therapy

## 2018-08-26 ENCOUNTER — Ambulatory Visit (INDEPENDENT_AMBULATORY_CARE_PROVIDER_SITE_OTHER): Payer: Medicare Other | Admitting: Internal Medicine

## 2018-08-26 ENCOUNTER — Encounter: Payer: Self-pay | Admitting: Internal Medicine

## 2018-08-26 VITALS — BP 112/70 | HR 67 | Temp 97.6°F | Resp 15 | Ht 71.0 in | Wt 223.2 lb

## 2018-08-26 DIAGNOSIS — E291 Testicular hypofunction: Secondary | ICD-10-CM

## 2018-08-26 DIAGNOSIS — Z0000 Encounter for general adult medical examination without abnormal findings: Secondary | ICD-10-CM | POA: Diagnosis not present

## 2018-08-26 DIAGNOSIS — Z23 Encounter for immunization: Secondary | ICD-10-CM

## 2018-08-26 DIAGNOSIS — E782 Mixed hyperlipidemia: Secondary | ICD-10-CM | POA: Diagnosis not present

## 2018-08-26 DIAGNOSIS — G4733 Obstructive sleep apnea (adult) (pediatric): Secondary | ICD-10-CM

## 2018-08-26 MED ORDER — ZOSTER VAC RECOMB ADJUVANTED 50 MCG/0.5ML IM SUSR: 0.5000 mL | Freq: Once | INTRAMUSCULAR | 1 refills | Status: AC

## 2018-08-26 NOTE — Patient Instructions (Addendum)
Congratulations on the weight loss!!  We will recheck your a1c and kidney function in 6 months, and it can be done during one of your testosterone injections  You received the Pneumonia vaccine today because you are 67.   The ShingRx vaccine is now available in local pharmacies ([place]) and is much more protective than your previous shingles vaccine ( Zostavax),  It is therefore ADVISED for all interested adults over 50 to prevent shingles    Health Maintenance, Male A healthy lifestyle and preventive care is important for your health and wellness. Ask your health care provider about what schedule of regular examinations is right for you. What should I know about weight and diet? Eat a Healthy Diet  Eat plenty of vegetables, fruits, whole grains, low-fat dairy products, and lean protein.  Do not eat a lot of foods high in solid fats, added sugars, or salt.  Maintain a Healthy Weight Regular exercise can help you achieve or maintain a healthy weight. You should:  Do at least 150 minutes of exercise each week. The exercise should increase your heart rate and make you sweat (moderate-intensity exercise).  Do strength-training exercises at least twice a week.  Watch Your Levels of Cholesterol and Blood Lipids  Have your blood tested for lipids and cholesterol every 5 years starting at 65 years of age. If you are at high risk for heart disease, you should start having your blood tested when you are 65 years old. You may need to have your cholesterol levels checked more often if: ? Your lipid or cholesterol levels are high. ? You are older than 65 years of age. ? You are at high risk for heart disease.  What should I know about cancer screening? Many types of cancers can be detected early and may often be prevented. Lung Cancer  You should be screened every year for lung cancer if: ? You are a current smoker who has smoked for at least 30 years. ? You are a  former smoker who has quit within the past 15 years.  Talk to your health care provider about your screening options, when you should start screening, and how often you should be screened.  Colorectal Cancer  Routine colorectal cancer screening usually begins at 65 years of age and should be repeated every 5-10 years until you are 65 years old. You may need to be screened more often if early forms of precancerous polyps or small growths are found. Your health care provider may recommend screening at an earlier age if you have risk factors for colon cancer.  Your health care provider may recommend using home test kits to check for hidden blood in the stool.  A small camera at the end of a tube can be used to examine your colon (sigmoidoscopy or colonoscopy). This checks for the earliest forms of colorectal cancer.  Prostate and Testicular Cancer  Depending on your age and overall health, your health care provider may do certain tests to screen for prostate and testicular cancer.  Talk to your health care provider about any symptoms or concerns you have about testicular or prostate cancer.  Skin Cancer  Check your skin from head to toe regularly.  Tell your health care provider about any new moles or changes in moles, especially if: ? There is a change in a mole's size, shape, or color. ? You have a mole that is larger than a pencil eraser.  Always use sunscreen. Apply sunscreen  liberally and repeat throughout the day.  Protect yourself by wearing long sleeves, pants, a wide-brimmed hat, and sunglasses when outside.  What should I know about heart disease, diabetes, and high blood pressure?  If you are 45-34 years of age, have your blood pressure checked every 3-5 years. If you are 51 years of age or older, have your blood pressure checked every year. You should have your blood pressure measured twice-once when you are at a hospital or clinic, and once when you are not at a hospital or  clinic. Record the average of the two measurements. To check your blood pressure when you are not at a hospital or clinic, you can use: ? An automated blood pressure machine at a pharmacy. ? A home blood pressure monitor.  Talk to your health care provider about your target blood pressure.  If you are between 66-75 years old, ask your health care provider if you should take aspirin to prevent heart disease.  Have regular diabetes screenings by checking your fasting blood sugar level. ? If you are at a normal weight and have a low risk for diabetes, have this test once every three years after the age of 55. ? If you are overweight and have a high risk for diabetes, consider being tested at a younger age or more often.  A one-time screening for abdominal aortic aneurysm (AAA) by ultrasound is recommended for men aged 31-75 years who are current or former smokers. What should I know about preventing infection? Hepatitis B If you have a higher risk for hepatitis B, you should be screened for this virus. Talk with your health care provider to find out if you are at risk for hepatitis B infection. Hepatitis C Blood testing is recommended for:  Everyone born from [birth year] through 1963.  Anyone with known risk factors for hepatitis C.  Sexually Transmitted Diseases (STDs)  You should be screened each year for STDs including gonorrhea and chlamydia if: ? You are sexually active and are younger than 65 years of age. ? You are older than 65 years of age and your health care provider tells you that you are at risk for this type of infection. ? Your sexual activity has changed since you were last screened and you are at an increased risk for chlamydia or gonorrhea. Ask your health care provider if you are at risk.  Talk with your health care provider about whether you are at high risk of being infected with HIV. Your health care provider may recommend a prescription medicine to help prevent HIV  infection.  What else can I do?  Schedule regular health, dental, and eye exams.  Stay current with your vaccines (immunizations).  Do not use any tobacco products, such as cigarettes, chewing tobacco, and e-cigarettes. If you need help quitting, ask your health care provider.  Limit alcohol intake to no more than 2 drinks per day. One drink equals 12 ounces of beer, 5 ounces of wine, or 1 ounces of hard liquor.  Do not use street drugs.  Do not share needles.  Ask your health care provider for help if you need support or information about quitting drugs.  Tell your health care provider if you often feel depressed.  Tell your health care provider if you have ever been abused or do not feel safe at home. This information is not intended to replace advice given to you by your health care provider. Make sure you discuss any questions you have with your  health care provider. Document Released: 05/15/2008 Document Revised: 07/16/2016 Document Reviewed: 08/21/2015 Elsevier Interactive Patient Education  2018 [place]

## 2018-08-26 NOTE — Progress Notes (Signed)
Patient ID: Bobby Rich, male    DOB: [birth year]  Age: 67 y.o. MRN: [id number]  The patient is here for Welcome to Medicare physical  examination and management of other chronic and acute problems.   The risk factors are reflected in the social history.  The roster of all physicians providing medical care to patient - is listed in the Snapshot section of the chart.  Activities of daily living:  The patient is 100% independent in all ADLs: dressing, toileting, feeding as well as independent mobility  Home safety : The patient has smoke detectors in the home. They wear seatbelts.  There are no firearms at home. There is no violence in the home.   There is no risks for hepatitis, STDs or HIV. There is no   history of blood transfusion. They have no travel history to infectious disease endemic areas of the world.  The patient has seen their dentist in the last six month. They have seen their eye doctor in the last year due to seeing flashes on right side   Incomplete retinal detachment on the right.   Annual skin checks with Bobby Rich.  .  Has early age related cataracts   He has hearing loss bilaterally  And has bilateral aids . Working full time,  Plans to work until 56   Some travel.  They do not  have excessive sun exposure. Discussed the need for sun protection: hats, long sleeves and use of sunscreen if there is significant sun exposure.   Diet: the importance of a healthy diet is discussed. They do have a healthy diet.  The benefits of regular aerobic exercise were discussed. He exercises 3 days per week   Depression screen: there are no signs or vegative symptoms of depression- irritability, change in appetite, anhedonia, sadness/tearfullness.  Cognitive assessment: the patient manages all their financial and personal affairs and is actively engaged. They could relate day,date,year and events; recalled 3/3 objects at 3 minutes; performed clock-face test normally.  The following portions  of the patient's history were reviewed and updated as appropriate: allergies, current medications, past family history, past medical history,  past surgical history, past social history  and problem list.  Visual acuity was not assessed per patient preference since he has regular follow up with his ophthalmologist. Hearing and body mass index were assessed and reviewed.   During the course of the visit the patient was educated and counseled about appropriate screening and preventive services including : fall prevention , diabetes screening, nutrition counseling, colorectal cancer screening, and recommended immunizations.    During the course of the visit , End of Life objectives were discussed at length,  Patient  Has a living will in place and has desiganated Bobby Rich his wife ans his healthcare power of attorney.     CC: The primary encounter diagnosis was Hypogonadism in male. Diagnoses of Need for 23-polyvalent pneumococcal polysaccharide vaccine, Visit for preventive health examination, Sleep apnea, obstructive, and Mixed hyperlipidemia were also pertinent to this visit.  1) BPH:   With elevated PSA and low testosterone .  Surveillance managed by Bobby Rich /Urology  Was receiving testosterone injections at our office every 14 days, until Jan 2019.   Last testosterone level was sept 18 and was low at 120   And per urology he was cleared to resume biweekly injections of testosterone   2) low back pain:  Spinal cord stenosis,  s/p spinal  cord stimulator  March 2019 after post laminectomy failure in  2017. Resolved the sciatic pain but continues to have back  Pain after long walks   Has Follow up at Bobby Rich  Getting  PT for core muscle weakness,  Spinal cord stimulator adjusted.  No longer taking oxycodone or gabapentin.  Taking mobic instead of celebrex. No longer taking cymbalta.   .   3) severe OSA diagnosed  In Oct 2018  CPAP ordered ,  Uses it nightly .  Noticed a difference when he travelled  without it.   4) right foot surgery  April 2019   Podiatry Bobby Rich  Some scar tissue and numbness    [name] has a past medical history of Cancer (Bobby Rich), Depression, GERD (gastroesophageal reflux disease), Hyperlipidemia, Hypertension, and Low testosterone.   He has a past surgical history that includes Spine surgery (2012); Hernia repair (Bilateral); Cosmetic surgery; and Lumbar laminectomy (08/2016).   His family history includes Early death (age of onset: 63) in his father; Heart disease (age of onset: 49) in his father; Heart disease (age of onset: 12) in his brother; Stroke in his mother.He reports that he has never smoked. He has never used smokeless tobacco. He reports that he does not drink alcohol or use drugs.  Outpatient Medications Prior to Visit  Medication Sig Dispense Refill  . aspirin 81 MG tablet Take 81 mg by mouth daily.     Marland Kitchen atorvastatin (LIPITOR) 20 MG tablet TAKE 1 TABLET DAILY 90 tablet 1  . Coenzyme Q10 (CO Q-10) 100 MG CAPS Take 1 capsule by mouth daily.    Marland Kitchen LACTOBACILLUS PO Take 1 capsule by mouth daily.    Marland Kitchen losartan-hydrochlorothiazide (HYZAAR) 50-12.5 MG tablet TAKE 1 TABLET DAILY 90 tablet 1  . meloxicam (MOBIC) 15 MG tablet     . Omega-3 Fatty Acids (FISH OIL) 1000 MG CAPS Take by mouth.    Marland Kitchen omeprazole (PRILOSEC) 40 MG capsule TAKE 1 CAPSULE DAILY 90 capsule 1  . sildenafil (VIAGRA) 100 MG tablet Take 1 tablet (100 mg total) by mouth daily as needed for erectile dysfunction. 18 tablet 3  . testosterone cypionate (DEPOTESTOSTERONE CYPIONATE) 200 MG/ML injection Inject 0.5 mLs (100 mg total) into the muscle every 14 (fourteen) days. 10 mL 3  . Caffeine 100 MG TABS Take 167 mg by mouth daily.    . celecoxib (CELEBREX) 200 MG capsule Take 1 capsule (200 mg total) by mouth 2 (two) times daily. (Patient not taking: Reported on 07/22/2018) 180 capsule 3  . cephALEXin (KEFLEX) 500 MG capsule Take by mouth.    . DULoxetine (CYMBALTA) 20 MG capsule Take 1  capsule (20 mg total) by mouth daily. For two weeks,  Then taper off as directed (Patient not taking: Reported on 07/22/2018) 30 capsule 0  . gabapentin (NEURONTIN) 300 MG capsule Take 1 capsule (300 mg total) by mouth 3 (three) times daily. (Patient not taking: Reported on 08/26/2018) 90 capsule 3  . Omega 3-6-9 Fatty Acids (TRIPLE OMEGA-3-6-9) CAPS Take 1 capsule by mouth daily.    Marland Kitchen oxyCODONE (OXY IR/ROXICODONE) 5 MG immediate release tablet Take by mouth.    . Zinc Acetate (GALZIN) 50 MG CAPS Take by mouth.     Facility-Administered Medications Prior to Visit  Medication Dose Route Frequency Provider Last Rate Last Dose  . testosterone cypionate (DEPOTESTOSTERONE CYPIONATE) injection 100 mg  100 mg Intramuscular Q14 Days Crecencio Mc, MD   100 mg at 07/22/18 0856  . testosterone cypionate (DEPOTESTOSTERONE CYPIONATE) injection 100 mg  100 mg Intramuscular Q14 Days  Crecencio Mc, MD   100 mg at 08/18/18 1303    Review of Systems   Patient denies headache, fevers, malaise, unintentional weight loss, skin rash, eye pain, sinus congestion and sinus pain, sore throat, dysphagia,  hemoptysis , cough, dyspnea, wheezing, chest pain, palpitations, orthopnea, edema, abdominal pain, nausea, melena, diarrhea, constipation, flank pain, dysuria, hematuria, urinary  Frequency, nocturia, numbness, tingling, seizures,  Focal weakness, Loss of consciousness,  Tremor, insomnia, depression, anxiety, and suicidal ideation.      Objective:  BP 112/70 (BP Location: Left Arm, Patient Position: Sitting, Cuff Size: Large)   Pulse 67   Temp 97.6 F (36.4 C) (Oral)   Resp 15   Ht 5\' 11"  (1.803 m)   Wt 223 lb 3.2 oz (101.2 kg)   SpO2 90%   BMI 31.13 kg/m   Physical Exam   General appearance: alert, cooperative and appears stated age Ears: normal TM's and external ear canals both ears Throat: lips, mucosa, and tongue normal; teeth and gums normal Neck: no adenopathy, no carotid bruit, supple,  symmetrical, trachea midline and thyroid not enlarged, symmetric, no tenderness/mass/nodules Back: symmetric, no curvature. ROM normal. No CVA tenderness. Lungs: clear to auscultation bilaterally Heart: regular rate and rhythm, S1, S2 normal, no murmur, click, rub or gallop Abdomen: soft, non-tender; bowel sounds normal; no masses,  no organomegaly Pulses: 2+ and symmetric Skin: Skin color, texture, turgor normal. No rashes or lesions Lymph nodes: Cervical, supraclavicular, and axillary nodes normal.    Assessment & Plan:   Problem List Items Addressed This Visit    Hyperlipidemia    Currently managed with atorvastatin.   LFTs normal.   Lab Results  Component Value Date   CHOL 148 07/22/2018   HDL 39.80 07/22/2018   LDLCALC 77 07/22/2018   LDLDIRECT 102.0 07/09/2017   TRIG 154.0 (H) 07/22/2018   CHOLHDL 4 07/22/2018   Lab Results  Component Value Date   ALT 16 07/22/2018   AST 14 07/22/2018   ALKPHOS 74 07/22/2018   BILITOT 0.7 07/22/2018         Hypogonadism in male - Primary    Managed with parenteral testosterone .resuming IM injections   Lab Results  Component Value Date   TESTOSTERONE 120 (L) 08/18/2018         Relevant Orders   Testos,Total,Free and SHBG (Male)   Sleep apnea, obstructive    Diagnosed by sleep study. he is wearing her CPAP every night a minimum of 6 hours per night and notes improved daytime wakefulness and decreased fatigue       Visit for preventive health examination    Welcome to l Medicare wellness  exam was done as well as a comprehensive physical exam and management of acute and chronic conditions .  During the course of the visit the patient was educated and counseled about appropriate screening and preventive services including : fall prevention , diabetes screening, nutrition counseling, colorectal cancer screening, and recommended immunizations.  Printed recommendations for health maintenance screenings was given. Finally, end of  Life objectives were discussed at length,  Patient does  have a living will in place or a healthcare power of attorney.  he was given printed information about advance directives and encouraged to return after discussing with him family,         Other Visit Diagnoses    Need for 23-polyvalent pneumococcal polysaccharide vaccine       Relevant Orders   Pneumococcal polysaccharide vaccine 23-valent greater than or equal to  2yo subcutaneous/IM (Completed)      I have discontinued Fransico Him. Kaus's TRIPLE OMEGA-3-6-9, celecoxib, Caffeine, gabapentin, cephALEXin, oxyCODONE, Zinc Acetate, and DULoxetine. I am also having him start on Zoster Vaccine Adjuvanted. Additionally, I am having him maintain his aspirin, Co Q-10, LACTOBACILLUS PO, sildenafil, testosterone cypionate, omeprazole, Fish Oil, losartan-hydrochlorothiazide, atorvastatin, and meloxicam. We will continue to administer testosterone cypionate and testosterone cypionate.  Meds ordered this encounter  Medications  . Zoster Vaccine Adjuvanted [facility]) injection    Sig: Inject 0.5 mLs into the muscle once for 1 dose.    Dispense:  1 each    Refill:  1    Medications Discontinued During This Encounter  Medication Reason  . Caffeine 100 MG TABS Patient has not taken in last 30 days  . celecoxib (CELEBREX) 200 MG capsule Patient has not taken in last 30 days  . cephALEXin (KEFLEX) 500 MG capsule Completed Course  . DULoxetine (CYMBALTA) 20 MG capsule Patient has not taken in last 30 days  . gabapentin (NEURONTIN) 300 MG capsule Patient has not taken in last 30 days  . Omega 3-6-9 Fatty Acids (TRIPLE OMEGA-3-6-9) CAPS Patient has not taken in last 30 days  . oxyCODONE (OXY IR/ROXICODONE) 5 MG immediate release tablet Patient has not taken in last 30 days  . Zinc Acetate (GALZIN) 50 MG CAPS Patient has not taken in last 30 days    Follow-up: No follow-ups on file.   Crecencio Mc, MD

## 2018-08-27 ENCOUNTER — Ambulatory Visit: Payer: Medicare Other | Admitting: Physical Therapy

## 2018-08-27 ENCOUNTER — Encounter: Payer: Self-pay | Admitting: Physical Therapy

## 2018-08-27 DIAGNOSIS — M545 Low back pain, unspecified: Secondary | ICD-10-CM

## 2018-08-27 NOTE — Therapy (Signed)
[facility] [address] [place], [address] Phone: [phone]   Fax:  [phone]  Physical Therapy Treatment  Patient Details  Name: Bobby Rich MRN: [id number] Date of Birth: 1951/03/14 Referring Provider (PT): Boortz-Marx   Encounter Date: 08/27/2018  PT End of Session - 08/27/18 0806    Visit Number  4    Number of Visits  17    Date for PT Re-Evaluation  09/16/18    PT Start Time  0800    PT Stop Time  0840    PT Time Calculation (min)  40 min    Activity Tolerance  Patient tolerated treatment well    Behavior During Therapy  [facility] for tasks assessed/performed       Past Medical History:  Diagnosis Date  . Cancer ([place])    basel cell skin cancer  . Depression   . GERD (gastroesophageal reflux disease)   . Hyperlipidemia   . Hypertension   . Low testosterone     Past Surgical History:  Procedure Laterality Date  . COSMETIC SURGERY    . HERNIA REPAIR Bilateral    with mesh   . LUMBAR LAMINECTOMY  08/2016  . SPINE SURGERY  2012   [place] L3 4 5     There were no vitals filed for this visit.  Subjective Assessment - 08/27/18 0804    Subjective  Patient reports his pain is getting better, and he is attempting to "squat better". Patient reports only 3/10 pain this am. Patient reports compliance with his HEP with no questions or concerns.     Pertinent History  Patient is a 67 year old male presenting with LBP. Patient with hx of L3-5 decompression with laminectomy Sept 2017 with infection complication, with follow-up SCS implantation March 2019 d/t sciatica sx. Since SCS patient reports redicular/sciatica sx have diminished. Patient is reporting today for new onset of pain from the past 1.5 months that he reports has increased since he has increased physical activity with walking for exercise. Patient reports he has been walking 44min (77miles) 3x/week. Worst pain over the past week 4/10 with  best pain 0/10 with rest. Patient reports most pain with transitional movements and pain with walking. Patient reports increased pain over implant site. Pt denies N/V, unexplained weight fluctuation, saddle paresthesia, B&B changes. fever, night sweats, or unrelenting night pain at this time.    Limitations  Standing;Walking;Lifting;House hold activities    How long can you sit comfortably?  unlimited    How long can you stand comfortably?  47min    How long can you walk comfortably?  4min    Diagnostic tests  None yet    Patient Stated Goals  walk 79mi 5x/week without pain to increase physical activity    Pain Onset  1 to 4 weeks ago       Ther-Ex - Nustep 4 min L3 - MATRIX hip abd 40# 3x 10 with demo and min cuing for proper form with good carry over following  - MATRIX hip ext 70# 3x 10 with demo and min cuing for proper posture without forward trunk lean compensation - Mini squat with elevated mat table TC 3x 10 with min cuing for glute contraction to prevent LB compensation - Theraball oblique twist 3x 10 wit demo and min cuing initially for core contraction with good carry over - Supine LB stretch 30sec holds 2x each side  Manual STM with trigger point release to bilat  lumbar paraspinals with noted tension relief following                     PT Education - 08/27/18 0805    Education Details  Exercise form    Person(s) Educated  Patient    Methods  Explanation;Demonstration;Verbal cues    Comprehension  Verbalized understanding;Returned demonstration;Verbal cues required       PT Short Term Goals - 07/23/18 0929      PT SHORT TERM GOAL #1   Title  Pt will be independent with HEP in order to improve strength and flexibility in order to decrease fall risk and improve function at home and work.    Time  4    Period  Weeks    Status  New        PT Long Term Goals - 07/23/18 0930      PT LONG TERM GOAL #1   Title  Patient will complete 6MWT ambulating  at least 1734ft with 0/10 pain to return to PLOF    Baseline  07/22/18: 17104ft with 3/10 pain    Time  8    Period  Weeks    Status  New      PT LONG TERM GOAL #2   Title  Pt will decrease 5TSTS by at least 3 seconds in order to demonstrate clinically significant improvement in LE strength    Baseline  07/22/18: 14sec    Time  8    Period  Weeks    Status  New      PT LONG TERM GOAL #3   Title  Patient will increase FOTO score to to demonstrate predicted increase in functional mobility to complete ADLs            Plan - 08/27/18 0844    Clinical Impression Statement  Patient is continuing to report decreased pain between session allowing for increased therex progression with good carry over into functional movement. Patient is able to complete all therex with accuracy following PT demo and cuing. PT will continue to progress core/hip therex with continued carry over to functional movements.     Rehab Potential  Good    Clinical Impairments Affecting Rehab Potential  (-) multiple spine comorbidities, chronicity of pain, obesity, sedentary lifestyle (+) motivation to increase activity level, social support    PT Frequency  2x / week    PT Duration  8 weeks    PT Treatment/Interventions  Passive range of motion;Neuromuscular re-education;Gait training;Stair training;Moist Heat;Traction;Cryotherapy;Therapeutic exercise;Balance training;Therapeutic activities;Patient/family education;Functional mobility training;Manual techniques;Dry needling    PT Next Visit Plan  hip progression, core stability training    PT Home Exercise Plan  post pelvic tilt, bridge, glute stretch, lateral childs pose, supine paraspinal stretch    Consulted and Agree with Plan of Care  Patient       Patient will benefit from skilled therapeutic intervention in order to improve the following deficits and impairments:  Improper body mechanics, Impaired sensation, Pain, Increased fascial restricitons, Increased muscle  spasms, Postural dysfunction, Decreased activity tolerance, Impaired flexibility, Decreased range of motion, Difficulty walking  Visit Diagnosis: Acute bilateral low back pain without sciatica     Problem List Patient Active Problem List   Diagnosis Date Noted  . Spinal cord stimulator status 02/19/2018  . Elevated PSA, less than 10 ng/ml 07/25/2017  . 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 Lumbar spine instability 01/19/2017  . 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 Loss of libido 07/13/2016  .  Sleep apnea, obstructive 07/13/2016  . Chronic pain syndrome 07/13/2016  . Mixed emotional features as adjustment reaction 07/03/2016  . Overweight (BMI 25.0-29.9) 06/30/2015  . L-S radiculopathy 09/21/2014  . HTN (hypertension) 02/14/2014  . HZV (herpes zoster virus) post herpetic neuralgia 05/27/2013  . Visit for preventive health examination 05/26/2013  . Male erectile dysfunction 01/29/2013  . GERD (gastroesophageal reflux disease)   . Hypogonadism in male   . Hyperlipidemia   . Depression    Shelton Silvas PT, DPT Shelton Silvas 08/27/2018, 8:48 AM  [facility] [address], [place], [address] Phone: [phone]   Fax:  [phone]  Name: Bobby Rich MRN: [id number] Date of Birth: August 02, 1951

## 2018-08-28 NOTE — Assessment & Plan Note (Addendum)
Welcome to l Medicare wellness  exam was done as well as a comprehensive physical exam and management of acute and chronic conditions .  During the course of the visit the patient was educated and counseled about appropriate screening and preventive services including : fall prevention , diabetes screening, nutrition counseling, colorectal cancer screening, and recommended immunizations.  Printed recommendations for health maintenance screenings was given. Finally, end of Life objectives were discussed at length,  Patient does  have a living will in place or a healthcare power of attorney.  he was given printed information about advance directives and encouraged to return after discussing with him family,

## 2018-08-28 NOTE — Assessment & Plan Note (Signed)
Diagnosed by sleep study. he is wearing her CPAP every night a minimum of 6 hours per night and notes improved daytime wakefulness and decreased fatigue  

## 2018-08-28 NOTE — Assessment & Plan Note (Signed)
Managed with parenteral testosterone .resuming IM injections   Lab Results  Component Value Date   TESTOSTERONE 120 (L) 08/18/2018

## 2018-08-28 NOTE — Assessment & Plan Note (Signed)
Currently managed with atorvastatin.   LFTs normal.   Lab Results  Component Value Date   CHOL 148 07/22/2018   HDL 39.80 07/22/2018   LDLCALC 77 07/22/2018   LDLDIRECT 102.0 07/09/2017   TRIG 154.0 (H) 07/22/2018   CHOLHDL 4 07/22/2018   Lab Results  Component Value Date   ALT 16 07/22/2018   AST 14 07/22/2018   ALKPHOS 74 07/22/2018   BILITOT 0.7 07/22/2018

## 2018-08-30 ENCOUNTER — Ambulatory Visit: Payer: Medicare Other | Admitting: Physical Therapy

## 2018-09-01 ENCOUNTER — Ambulatory Visit (INDEPENDENT_AMBULATORY_CARE_PROVIDER_SITE_OTHER): Payer: Medicare Other | Admitting: *Deleted

## 2018-09-01 DIAGNOSIS — E291 Testicular hypofunction: Secondary | ICD-10-CM | POA: Diagnosis not present

## 2018-09-01 MED ORDER — TESTOSTERONE CYPIONATE 200 MG/ML IM SOLN
100.0000 mg | INTRAMUSCULAR | Status: DC
  Administered 2018-09-01 – 2019-04-28 (×2): 100 mg via INTRAMUSCULAR

## 2018-09-01 NOTE — Progress Notes (Signed)
Patient presented for testosterone injection to [facility], patient voiced no complaints during injection.

## 2018-09-02 DIAGNOSIS — H903 Sensorineural hearing loss, bilateral: Secondary | ICD-10-CM | POA: Diagnosis not present

## 2018-09-03 ENCOUNTER — Encounter: Payer: Self-pay | Admitting: Physical Therapy

## 2018-09-03 ENCOUNTER — Ambulatory Visit: Payer: Medicare Other | Attending: Anesthesiology | Admitting: Physical Therapy

## 2018-09-03 DIAGNOSIS — M545 Low back pain, unspecified: Secondary | ICD-10-CM

## 2018-09-03 NOTE — Therapy (Addendum)
[facility] [address] [place], [address] Phone: [phone]   Fax:  [phone]  Physical Therapy Treatment  Patient Details  Name: Bobby Rich MRN: [id number] Date of Birth: Apr 16, 1951 Referring Provider (PT): Boortz-Marx   Encounter Date: 09/03/2018  PT End of Session - 09/03/18 0855    Visit Number  5    Number of Visits  17    Date for PT Re-Evaluation  09/16/18    PT Start Time  0805    PT Stop Time  0845    PT Time Calculation (min)  40 min    Activity Tolerance  Patient tolerated treatment well    Behavior During Therapy  [facility] for tasks assessed/performed       Past Medical History:  Diagnosis Date  . Cancer ([place])    basel cell skin cancer  . Depression   . GERD (gastroesophageal reflux disease)   . Hyperlipidemia   . Hypertension   . Low testosterone     Past Surgical History:  Procedure Laterality Date  . COSMETIC SURGERY    . HERNIA REPAIR Bilateral    with mesh   . LUMBAR LAMINECTOMY  08/2016  . SPINE SURGERY  2012   [place] L3 4 5     There were no vitals filed for this visit.  Subjective Assessment - 09/03/18 0809    Subjective  Patient reports he is continuing to have 3/10 pain. Patient reports he went to the massage therapist yesterday, which he thinks was somewhat helpful. Patient reports he is having some tightness and "trigger points" (per massage therapist) in his glutes and quads. Patient reports compliance with his HEP with no questions or concerns.     Pertinent History  Patient is a 67 year old male presenting with LBP. Patient with hx of L3-5 decompression with laminectomy Sept 2017 with infection complication, with follow-up SCS implantation March 2019 d/t sciatica sx. Since SCS patient reports redicular/sciatica sx have diminished. Patient is reporting today for new onset of pain from the past 1.5 months that he reports has increased since he has increased  physical activity with walking for exercise. Patient reports he has been walking 10min (50miles) 3x/week. Worst pain over the past week 4/10 with best pain 0/10 with rest. Patient reports most pain with transitional movements and pain with walking. Patient reports increased pain over implant site. Pt denies N/V, unexplained weight fluctuation, saddle paresthesia, B&B changes. fever, night sweats, or unrelenting night pain at this time.    Limitations  Standing;Walking;Lifting;House hold activities    How long can you sit comfortably?  unlimited    How long can you stand comfortably?  42min    How long can you walk comfortably?  70min    Diagnostic tests  None yet    Patient Stated Goals  walk 31mi 5x/week without pain to increase physical activity    Pain Onset  1 to 4 weeks ago       Ther-Ex - Nustep 5 min L4 - Deadbug with LE march x10 (each); Deadbug with LE ext and UE ext 2x 8 each - Attempted SL bridge- pt unable to complete  - Bridge with green tband at knees x10; progressed to bridge with green tband at knees on bosu ball 2x 10 - Mini squat with mat table for TC and min cuing for proper form 3x 10 - Standing quad stretch x30sec hold (HEP) - Seated hamstring stretch x30sec hold (HEP) -  Review of seated glute stretch   Manual - Manual elys stretch 3x 45sec hold each side - Elys position contract relax 10sec contract; 20sec relax with inc stretch as able - Hamstring 90/90 stretch 30sec hold each side                      PT Education - 09/03/18 0854    Education Details  Exercise form    Person(s) Educated  Patient    Methods  Explanation;Demonstration;Verbal cues    Comprehension  Verbalized understanding;Returned demonstration;Verbal cues required       PT Short Term Goals - 07/23/18 0929      PT SHORT TERM GOAL #1   Title  Pt will be independent with HEP in order to improve strength and flexibility in order to decrease fall risk and improve function at  home and work.    Time  4    Period  Weeks    Status  New        PT Long Term Goals - 07/23/18 0930      PT LONG TERM GOAL #1   Title  Patient will complete 6MWT ambulating at least 1750ft with 0/10 pain to return to PLOF    Baseline  07/22/18: 1783ft with 3/10 pain    Time  8    Period  Weeks    Status  New      PT LONG TERM GOAL #2   Title  Pt will decrease 5TSTS by at least 3 seconds in order to demonstrate clinically significant improvement in LE strength    Baseline  07/22/18: 14sec    Time  8    Period  Weeks    Status  New      PT LONG TERM GOAL #3   Title  Patient will increase FOTO score to to demonstrate predicted increase in functional mobility to complete ADLs            Plan - 09/03/18 0819    Clinical Impression Statement  Patient is reporting increased muscle soreness and trigger points in glutes with discomfort in bilat quads. PT updated HEP to include glute strengthening 3x/week to decrease soreness in glutes and educated patient in quad stretching. Patient verblaizes understanding of all provided education and completes therex with accuracy following PT cuing.     Rehab Potential  Good    Clinical Impairments Affecting Rehab Potential  (-) multiple spine comorbidities, chronicity of pain, obesity, sedentary lifestyle (+) motivation to increase activity level, social support    PT Frequency  2x / week    PT Treatment/Interventions  Passive range of motion;Neuromuscular re-education;Gait training;Stair training;Moist Heat;Traction;Cryotherapy;Therapeutic exercise;Balance training;Therapeutic activities;Patient/family education;Functional mobility training;Manual techniques;Dry needling    PT Next Visit Plan  hip progression, core stability training    PT Home Exercise Plan  dead bugs, mini squat with green tband at knees, bridge with tband, glute stretch, hamstring stretch, quad stretch, [id number]     Consulted and Agree with Plan of Care  Patient        Patient will benefit from skilled therapeutic intervention in order to improve the following deficits and impairments:  Improper body mechanics, Impaired sensation, Pain, Increased fascial restricitons, Increased muscle spasms, Postural dysfunction, Decreased activity tolerance, Impaired flexibility, Decreased range of motion, Difficulty walking  Visit Diagnosis: Acute bilateral low back pain without sciatica     Problem List Patient Active Problem List   Diagnosis Date Noted  . Spinal cord stimulator status 02/19/2018  .  Elevated PSA, less than 10 ng/ml 07/25/2017  . 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 Lumbar spine instability 01/19/2017  . 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 Loss of libido 07/13/2016  . Sleep apnea, obstructive 07/13/2016  . Chronic pain syndrome 07/13/2016  . Mixed emotional features as adjustment reaction 07/03/2016  . Overweight (BMI 25.0-29.9) 06/30/2015  . L-S radiculopathy 09/21/2014  . HTN (hypertension) 02/14/2014  . HZV (herpes zoster virus) post herpetic neuralgia 05/27/2013  . Visit for preventive health examination 05/26/2013  . Male erectile dysfunction 01/29/2013  . GERD (gastroesophageal reflux disease)   . Hypogonadism in male   . Hyperlipidemia   . Depression    Shelton Silvas PT, DPT Shelton Silvas 09/03/2018, 9:21 AM  [facility] [address] [place], [address] Phone: [phone]   Fax:  [phone]  Name: SHADRICK SENNE MRN: [id number] Date of Birth: 09/21/1951

## 2018-09-06 ENCOUNTER — Ambulatory Visit: Payer: Medicare Other | Admitting: Physical Therapy

## 2018-09-06 ENCOUNTER — Encounter: Payer: Self-pay | Admitting: Physical Therapy

## 2018-09-06 DIAGNOSIS — M545 Low back pain, unspecified: Secondary | ICD-10-CM

## 2018-09-06 DIAGNOSIS — C44619 Basal cell carcinoma of skin of left upper limb, including shoulder: Secondary | ICD-10-CM | POA: Diagnosis not present

## 2018-09-06 NOTE — Therapy (Signed)
[facility] [address] [place], [address] Phone: [phone]   Fax:  [phone]  Physical Therapy Treatment  Patient Details  Name: Bobby Rich MRN: [id number] Date of Birth: 15-Jun-1951 Referring Provider (PT): Boortz-Marx   Encounter Date: 09/06/2018  PT End of Session - 09/06/18 0825    Visit Number  6    Number of Visits  17    Date for PT Re-Evaluation  09/16/18    PT Start Time  0815    PT Stop Time  0900    PT Time Calculation (min)  45 min    Activity Tolerance  Patient tolerated treatment well    Behavior During Therapy  [facility] for tasks assessed/performed       Past Medical History:  Diagnosis Date  . Cancer ([place])    basel cell skin cancer  . Depression   . GERD (gastroesophageal reflux disease)   . Hyperlipidemia   . Hypertension   . Low testosterone     Past Surgical History:  Procedure Laterality Date  . COSMETIC SURGERY    . HERNIA REPAIR Bilateral    with mesh   . LUMBAR LAMINECTOMY  08/2016  . SPINE SURGERY  2012   [place] L3 4 5     There were no vitals filed for this visit.  Subjective Assessment - 09/06/18 0823    Subjective  Patient reports he is having 3/10 pain today that he describes as muscle soreness from yard work yesterday. Patient reports compliance with his HEP with no questions or concerns.     Pertinent History  Patient is a 67 year old male presenting with LBP. Patient with hx of L3-5 decompression with laminectomy Sept 2017 with infection complication, with follow-up SCS implantation March 2019 d/t sciatica sx. Since SCS patient reports redicular/sciatica sx have diminished. Patient is reporting today for new onset of pain from the past 1.5 months that he reports has increased since he has increased physical activity with walking for exercise. Patient reports he has been walking 76min (33miles) 3x/week. Worst pain over the past week 4/10 with best pain 0/10  with rest. Patient reports most pain with transitional movements and pain with walking. Patient reports increased pain over implant site. Pt denies N/V, unexplained weight fluctuation, saddle paresthesia, B&B changes. fever, night sweats, or unrelenting night pain at this time.    Limitations  Standing;Walking;Lifting;House hold activities    How long can you sit comfortably?  unlimited    How long can you stand comfortably?  11min    How long can you walk comfortably?  92min    Diagnostic tests  None yet    Patient Stated Goals  walk 73mi 5x/week without pain to increase physical activity    Pain Onset  1 to 4 weeks ago       Ther-Ex - Nustep 5 min L4 - Mini squat with mat table for TC and min cuing for proper form 3x 10 decreasing mat height each set - Hooklying lower trunk rotations x20 with min cuing to keep back in contact with mat table - Self lumbar traction with UE 10sec traction; 10sec relax x5 with cuing for "slow release" for HEP  - Education on "getting up and moving" every hour with his job to prevent "stiffness" which is his chief complaint (transitional movements from prolonged sitting ie- getting out of a car) at this time.    Manual - STM with trigger point release  to L glute max/min/piriformis, and bilat lumbar paraspinals L>R - Manual hooklying lumbar traction 10sec traction; 10sec relax x10; repeated with LE elevated on theraball                        PT Education - 09/06/18 0825    Education Details  Exercise form; POC     Person(s) Educated  Patient    Methods  Explanation;Demonstration;Verbal cues    Comprehension  Verbalized understanding;Returned demonstration;Verbal cues required       PT Short Term Goals - 07/23/18 0929      PT SHORT TERM GOAL #1   Title  Pt will be independent with HEP in order to improve strength and flexibility in order to decrease fall risk and improve function at home and work.    Time  4    Period  Weeks     Status  New        PT Long Term Goals - 07/23/18 0930      PT LONG TERM GOAL #1   Title  Patient will complete 6MWT ambulating at least 1766ft with 0/10 pain to return to PLOF    Baseline  07/22/18: 1714ft with 3/10 pain    Time  8    Period  Weeks    Status  New      PT LONG TERM GOAL #2   Title  Pt will decrease 5TSTS by at least 3 seconds in order to demonstrate clinically significant improvement in LE strength    Baseline  07/22/18: 14sec    Time  8    Period  Weeks    Status  New      PT LONG TERM GOAL #3   Title  Patient will increase FOTO score to to demonstrate predicted increase in functional mobility to complete ADLs            Plan - 09/06/18 1257    Clinical Impression Statement  Patient is beginning to plateau with progress. Patient is demonstrating better muscle activation and form with transitional movements, but is continuing to report pain with standing from prolonged sititng. PT led patient through therex for motion as opposed to strength this session to attempt to decrease lumbar "stiffness" sensation that is patient's cheif complaint of pain at this time. HEP was updated accordingly.     Rehab Potential  Good    Clinical Impairments Affecting Rehab Potential  (-) multiple spine comorbidities, chronicity of pain, obesity, sedentary lifestyle (+) motivation to increase activity level, social support    PT Frequency  2x / week    PT Treatment/Interventions  Passive range of motion;Neuromuscular re-education;Gait training;Stair training;Moist Heat;Traction;Cryotherapy;Therapeutic exercise;Balance training;Therapeutic activities;Patient/family education;Functional mobility training;Manual techniques;Dry needling    PT Next Visit Plan  hip progression, core stability training    PT Home Exercise Plan  dead bugs, mini squat with green tband at knees, bridge with tband, glute stretch, hamstring stretch, quad stretch, self lumbar traction, lower lumbar rotations  [id number]     Consulted and Agree with Plan of Care  Patient       Patient will benefit from skilled therapeutic intervention in order to improve the following deficits and impairments:  Improper body mechanics, Impaired sensation, Pain, Increased fascial restricitons, Increased muscle spasms, Postural dysfunction, Decreased activity tolerance, Impaired flexibility, Decreased range of motion, Difficulty walking  Visit Diagnosis: Acute bilateral low back pain without sciatica     Problem List Patient Active Problem List   Diagnosis  Date Noted  . Spinal cord stimulator status 02/19/2018  . Elevated PSA, less than 10 ng/ml 07/25/2017  . 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 Lumbar spine instability 01/19/2017  . 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 Loss of libido 07/13/2016  . Sleep apnea, obstructive 07/13/2016  . Chronic pain syndrome 07/13/2016  . Mixed emotional features as adjustment reaction 07/03/2016  . Overweight (BMI 25.0-29.9) 06/30/2015  . L-S radiculopathy 09/21/2014  . HTN (hypertension) 02/14/2014  . HZV (herpes zoster virus) post herpetic neuralgia 05/27/2013  . Visit for preventive health examination 05/26/2013  . Male erectile dysfunction 01/29/2013  . GERD (gastroesophageal reflux disease)   . Hypogonadism in male   . Hyperlipidemia   . Depression    Shelton Silvas PT, DPT Shelton Silvas 09/06/2018, 1:11 PM  [facility] [address], [place], [address] Phone: [phone]   Fax:  [phone]  Name: Bobby Rich MRN: [id number] Date of Birth: [birth year]

## 2018-09-08 ENCOUNTER — Ambulatory Visit: Payer: Medicare Other | Admitting: Physical Therapy

## 2018-09-13 ENCOUNTER — Ambulatory Visit: Payer: Medicare Other

## 2018-09-13 DIAGNOSIS — M545 Low back pain, unspecified: Secondary | ICD-10-CM

## 2018-09-13 NOTE — Therapy (Signed)
[facility] [address], [place], [address] Phone: [phone]   Fax:  [phone]  Physical Therapy Treatment  Patient Details  Name: Bobby Rich MRN: [id number] Date of Birth: 06/06/1951 Referring Provider (PT): Boortz-Marx   Encounter Date: 09/13/2018  PT End of Session - 09/13/18 0815    Visit Number  7    Number of Visits  17    Date for PT Re-Evaluation  09/16/18    PT Start Time  0815    PT Stop Time  3220    PT Time Calculation (min)  40 min    Activity Tolerance  Patient tolerated treatment well    Behavior During Therapy  [facility] for tasks assessed/performed       Past Medical History:  Diagnosis Date  . Cancer ([place])    basel cell skin cancer  . Depression   . GERD (gastroesophageal reflux disease)   . Hyperlipidemia   . Hypertension   . Low testosterone     Past Surgical History:  Procedure Laterality Date  . COSMETIC SURGERY    . HERNIA REPAIR Bilateral    with mesh   . LUMBAR LAMINECTOMY  08/2016  . SPINE SURGERY  2012   [place] L3 4 5     There were no vitals filed for this visit.  Subjective Assessment - 09/13/18 0816    Subjective  Patient reports that he is having his usual pain today. States that bending and twisting bother him the most.     Pertinent History  Patient is a 67 year old male presenting with LBP. Patient with hx of L3-5 decompression with laminectomy Sept 2017 with infection complication, with follow-up SCS implantation March 2019 d/t sciatica sx. Since SCS patient reports redicular/sciatica sx have diminished. Patient is reporting today for new onset of pain from the past 1.5 months that he reports has increased since he has increased physical activity with walking for exercise. Patient reports he has been walking 10min (98miles) 3x/week. Worst pain over the past week 4/10 with best pain 0/10 with rest. Patient reports most pain with transitional movements and  pain with walking. Patient reports increased pain over implant site. Pt denies N/V, unexplained weight fluctuation, saddle paresthesia, B&B changes. fever, night sweats, or unrelenting night pain at this time.    Currently in Pain?  Yes    Pain Score  3     Pain Location  Back    Pain Orientation  Left;Right    Pain Descriptors / Indicators  Aching;Burning    Pain Type  Acute pain    Pain Onset  1 to 4 weeks ago       Ther-Ex - Mini squatwith mat table with min cuing for proper form 3x 10 with low mat table - Hooklying lower trunk rotations x20 with min cuing to keep back in contact with mat table - piriformis stretch b/l in supine 3x30s ea Seated self lumbar traction 10s x5 with slow release, demo verbal/visual cues needed hooklying isometric hip abduction with belt 15x3s holds -prone hip extension x10 with tactil/verbal cues with good carry over -s/l clamshells 2x5 b/l with tactile/visual cues with good carry over  Manual x75mins - STM with trigger point release to L and R glute max/min/piriformis, and bilat lumbar paraspinals L>R   Patient response to treatment:Patient reports soreness/stretching with transitional movements such as sitting to standing or vice versa. Good tolerance to exercises today. TTP to b/l glute max/med  and piriformis, notable trigger points b/l L >R. Soft tissue integrity improved by 25% s/p STM. Patient reported decrease in pain from 3/10 to a 2/10 at end of session.     PT Education - 09/13/18 0814    Education Details  exercises form/technique    Person(s) Educated  Patient    Methods  Explanation;Verbal cues;Demonstration    Comprehension  Verbalized understanding;Returned demonstration       PT Short Term Goals - 07/23/18 0929      PT SHORT TERM GOAL #1   Title  Pt will be independent with HEP in order to improve strength and flexibility in order to decrease fall risk and improve function at home and work.    Time  4    Period  Weeks    Status   New        PT Long Term Goals - 07/23/18 0930      PT LONG TERM GOAL #1   Title  Patient will complete 6MWT ambulating at least 1761ft with 0/10 pain to return to PLOF    Baseline  07/22/18: 175ft with 3/10 pain    Time  8    Period  Weeks    Status  New      PT LONG TERM GOAL #2   Title  Pt will decrease 5TSTS by at least 3 seconds in order to demonstrate clinically significant improvement in LE strength    Baseline  07/22/18: 14sec    Time  8    Period  Weeks    Status  New      PT LONG TERM GOAL #3   Title  Patient will increase FOTO score to to demonstrate predicted increase in functional mobility to complete ADLs        Patient will benefit from skilled therapeutic intervention in order to improve the following deficits and impairments:  Improper body mechanics, Impaired sensation, Pain, Increased fascial restricitons, Increased muscle spasms, Postural dysfunction, Decreased activity tolerance, Impaired flexibility, Decreased range of motion, Difficulty walking  Visit Diagnosis: Acute bilateral low back pain without sciatica     Problem List Patient Active Problem List   Diagnosis Date Noted  . Spinal cord stimulator status 02/19/2018  . Elevated PSA, less than 10 ng/ml 07/25/2017  . 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 Lumbar spine instability 01/19/2017  . 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 Loss of libido 07/13/2016  . Sleep apnea, obstructive 07/13/2016  . Chronic pain syndrome 07/13/2016  . Mixed emotional features as adjustment reaction 07/03/2016  . Overweight (BMI 25.0-29.9) 06/30/2015  . L-S radiculopathy 09/21/2014  . HTN (hypertension) 02/14/2014  . HZV (herpes zoster virus) post herpetic neuralgia 05/27/2013  . Visit for preventive health examination 05/26/2013  . Male erectile dysfunction 01/29/2013  . GERD (gastroesophageal reflux disease)   . Hypogonadism in male   . Hyperlipidemia   . Depression     Lieutenant Diego  PT, DPT 9:00 AM,09/13/18 [facility] [address] [place], [address] Phone: [phone]   Fax:  [phone]  Name: Bobby Rich MRN: [id number] Date of Birth: Jul 26, 1951

## 2018-09-15 ENCOUNTER — Ambulatory Visit (INDEPENDENT_AMBULATORY_CARE_PROVIDER_SITE_OTHER): Payer: Medicare Other | Admitting: *Deleted

## 2018-09-15 ENCOUNTER — Encounter: Payer: Self-pay | Admitting: Physical Therapy

## 2018-09-15 ENCOUNTER — Ambulatory Visit: Payer: Medicare Other | Admitting: Physical Therapy

## 2018-09-15 DIAGNOSIS — M545 Low back pain, unspecified: Secondary | ICD-10-CM

## 2018-09-15 DIAGNOSIS — E291 Testicular hypofunction: Secondary | ICD-10-CM

## 2018-09-15 MED ORDER — TESTOSTERONE CYPIONATE 200 MG/ML IM SOLN
100.0000 mg | Freq: Once | INTRAMUSCULAR | Status: AC
  Administered 2018-09-15: 100 mg via INTRAMUSCULAR

## 2018-09-15 NOTE — Progress Notes (Signed)
Testosterone given in RUQ Patient voiced no concerns during injection.

## 2018-09-15 NOTE — Therapy (Signed)
[facility] [address], [place], [address] Phone: [phone]   Fax:  [phone]  Physical Therapy Treatment/ DC Summary  Patient Details  Name: Bobby Rich MRN: [id number] Date of Birth: May 24, 1951 Referring Provider (PT): Boortz-Marx   Encounter Date: 09/15/2018  PT End of Session - 09/15/18 0738    Visit Number  8    Number of Visits  17    Date for PT Re-Evaluation  09/16/18    PT Start Time  0730    PT Stop Time  0810    PT Time Calculation (min)  40 min    Activity Tolerance  Patient tolerated treatment well    Behavior During Therapy  [facility] for tasks assessed/performed       Past Medical History:  Diagnosis Date  . Cancer ([place])    basel cell skin cancer  . Depression   . GERD (gastroesophageal reflux disease)   . Hyperlipidemia   . Hypertension   . Low testosterone     Past Surgical History:  Procedure Laterality Date  . COSMETIC SURGERY    . HERNIA REPAIR Bilateral    with mesh   . LUMBAR LAMINECTOMY  08/2016  . SPINE SURGERY  2012   [place] L3 4 5     There were no vitals filed for this visit.  Subjective Assessment - 09/15/18 0736    Subjective  Patient reports he feels better since adding stretching/self traction to his HEP. Patient does report occasional flare up that "goes away quickly". Patient reports "flare ups" are more "stiff in nature than painful. Patietn reports no pain this am. Patient reports compliance with his HEP with no questions or concerns.     Pertinent History  Patient is a 67 year old male presenting with LBP. Patient with hx of L3-5 decompression with laminectomy Sept 2017 with infection complication, with follow-up SCS implantation March 2019 d/t sciatica sx. Since SCS patient reports redicular/sciatica sx have diminished. Patient is reporting today for new onset of pain from the past 1.5 months that he reports has increased since he has increased  physical activity with walking for exercise. Patient reports he has been walking 64mn (217mes) 3x/week. Worst pain over the past week 4/10 with best pain 0/10 with rest. Patient reports most pain with transitional movements and pain with walking. Patient reports increased pain over implant site. Pt denies N/V, unexplained weight fluctuation, saddle paresthesia, B&B changes. fever, night sweats, or unrelenting night pain at this time.    Limitations  Standing;Walking;Lifting;House hold activities    How long can you sit comfortably?  unlimited    How long can you stand comfortably?  157m   How long can you walk comfortably?  12m48m  Diagnostic tests  None yet    Pain Onset  1 to 4 weeks ago          Ther-Ex - Nustep L3 5min50mr warm up with PT utilizing warmup to discuss possible DC this session; which patient reports he is ready for, with no complaints of "pain" over the past week - 2 trials of 5x STS with best time 10.5 w/o use of hands with patient demonstrating good carry over of proper form from sessions, and no pain noted following - 6MWT with minimal pain reported from patient following - Review of HEP for maintenance with education on strengthening and stretching parameters, how to inc/dec resistance with bands, frequency, etc.  Exercises  Supine Bridge with Resistance Band - 10 reps - 3 sets - 1x daily - 3x weekly  Squat with Chair Touch and Resistance Loop - 10 reps - 3 sets - 1x daily - 3x weekly  * Standing Hip Abduction with Resistance at Ankles and Counter Support - 10 reps - 3 sets - 1x daily - 3x weekly * Standing Hip Extension with Resistance at Ankles and Counter Support - 10 reps - 3 sets - 1x daily - 3x weekly * Supine Dead Bug with Leg Extension - 8 reps - 3 sets - 1x daily - 3x weekly * Supine March - 10 reps - 3 sets - 1x daily - 3x weekly * Supine Lower Trunk Rotation - 20 reps - 1x daily - 7x weekly  Seated Piriformis Stretch - 45 hold - 3x daily - 7x weekly   Standing Quad Stretch with Rotation - 45 hold - 1x daily - 7x weekly  Seated Hamstring Stretch - 45 hold - 3x daily - 7x weekly    * indicates exercise PT reviewed physical demo of with patient to ensure proper form                PT Education - 09/15/18 0738    Education Details  HEP review, DC recommendations    Person(s) Educated  Patient    Methods  Explanation;Demonstration;Verbal cues    Comprehension  Verbalized understanding;Returned demonstration;Verbal cues required       PT Short Term Goals - 09/15/18 0739      PT SHORT TERM GOAL #1   Title  Pt will be independent with HEP in order to improve strength and flexibility in order to decrease fall risk and improve function at home and work.    Time  4    Period  Weeks    Status  Achieved        PT Long Term Goals - 09/15/18 0739      PT LONG TERM GOAL #1   Title  Patient will complete 6MWT ambulating at least 1765f with 0/10 pain to return to PLOF    Baseline  09/14/18 17767fwith 1/10 pain that subsides quickly    Time  8    Period  Weeks    Status  Achieved      PT LONG TERM GOAL #2   Title  Pt will decrease 5TSTS by at least 3 seconds in order to demonstrate clinically significant improvement in LE strength    Baseline  09/15/18 10.5sec    Time  8    Period  Weeks    Status  Achieved      PT LONG TERM GOAL #3   Title  Patient will increase FOTO score to to demonstrate predicted increase in functional mobility to complete ADLs    Baseline  09/14/18    Time  8      PT LONG TERM GOAL #4   Title  \            Plan - 09/15/18 0749    Clinical Impression Statement  Patient has met all goals at this time and reports no issues with ADLs and work tasks. PT and patient reviewed HEP for maintainance, which patient is able to demonstrate and verbalize understanding of with 100% accuracy. Pt d/c PT with clinic contact info should any further impairments arise.     Rehab Potential  Good     Clinical Impairments Affecting Rehab Potential  (-) multiple spine comorbidities,  chronicity of pain, obesity, sedentary lifestyle (+) motivation to increase activity level, social support    PT Frequency  2x / week    PT Duration  8 weeks    PT Treatment/Interventions  Passive range of motion;Neuromuscular re-education;Gait training;Stair training;Moist Heat;Traction;Cryotherapy;Therapeutic exercise;Balance training;Therapeutic activities;Patient/family education;Functional mobility training;Manual techniques;Dry needling    PT Home Exercise Plan  dead bugs, mini squat with green tband at knees, bridge with tband, glute stretch, hamstring stretch, quad stretch, self lumbar traction, lower lumbar rotations [id number]     Consulted and Agree with Plan of Care  Patient       Patient will benefit from skilled therapeutic intervention in order to improve the following deficits and impairments:  Improper body mechanics, Impaired sensation, Pain, Increased fascial restricitons, Increased muscle spasms, Postural dysfunction, Decreased activity tolerance, Impaired flexibility, Decreased range of motion, Difficulty walking  Visit Diagnosis: Acute bilateral low back pain without sciatica     Problem List Patient Active Problem List   Diagnosis Date Noted  . Spinal cord stimulator status 02/19/2018  . Elevated PSA, less than 10 ng/ml 07/25/2017  . 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 Lumbar spine instability 01/19/2017  . 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 Loss of libido 07/13/2016  . Sleep apnea, obstructive 07/13/2016  . Chronic pain syndrome 07/13/2016  . Mixed emotional features as adjustment reaction 07/03/2016  . Overweight (BMI 25.0-29.9) 06/30/2015  . L-S radiculopathy 09/21/2014  . HTN (hypertension) 02/14/2014  . HZV (herpes zoster virus) post herpetic neuralgia 05/27/2013  . Visit for preventive health examination 05/26/2013  . Male erectile dysfunction  01/29/2013  . GERD (gastroesophageal reflux disease)   . Hypogonadism in male   . Hyperlipidemia   . Depression    Shelton Silvas PT, DPT Leroi Haque 09/15/2018, 8:39 AM  [facility] [address], [place], [address] Phone: [phone]   Fax:  [phone]  Name: Bobby Rich MRN: [id number] Date of Birth: [birth year]

## 2018-09-20 ENCOUNTER — Encounter: Payer: Medicare Other | Admitting: Physical Therapy

## 2018-09-24 ENCOUNTER — Encounter: Payer: Medicare Other | Admitting: Physical Therapy

## 2018-09-27 ENCOUNTER — Encounter: Payer: Medicare Other | Admitting: Physical Therapy

## 2018-09-29 ENCOUNTER — Encounter: Payer: Medicare Other | Admitting: Physical Therapy

## 2018-10-05 ENCOUNTER — Ambulatory Visit (INDEPENDENT_AMBULATORY_CARE_PROVIDER_SITE_OTHER): Payer: Medicare Other | Admitting: *Deleted

## 2018-10-05 DIAGNOSIS — E291 Testicular hypofunction: Secondary | ICD-10-CM | POA: Diagnosis not present

## 2018-10-05 MED ORDER — TESTOSTERONE CYPIONATE 200 MG/ML IM SOLN
100.0000 mg | Freq: Once | INTRAMUSCULAR | Status: AC
  Administered 2018-10-05: 100 mg via INTRAMUSCULAR

## 2018-10-05 NOTE — Progress Notes (Signed)
Patient presented for Testosterone injection for Hypogonadism, patient voiced no concerns during or after injection.

## 2018-10-08 ENCOUNTER — Telehealth: Payer: Self-pay | Admitting: Internal Medicine

## 2018-10-08 NOTE — Telephone Encounter (Signed)
Copied from [place] [id number]. Topic: Quick Communication - See Telephone Encounter >> Oct 08, 2018 12:35 PM Vernona Rieger wrote: CRM for notification. See Telephone encounter for: 10/08/18.  [facility] called and said that they faxed over a script for Cpap supplies and it was sent one week ago and he needs that signed by the doctor and faxed back [phone]  Call back @ [phone]

## 2018-10-08 NOTE — Telephone Encounter (Signed)
Please advise 

## 2018-10-11 DIAGNOSIS — G8929 Other chronic pain: Secondary | ICD-10-CM | POA: Diagnosis not present

## 2018-10-11 DIAGNOSIS — Z9889 Other specified postprocedural states: Secondary | ICD-10-CM | POA: Diagnosis not present

## 2018-10-11 DIAGNOSIS — M545 Low back pain: Secondary | ICD-10-CM | POA: Diagnosis not present

## 2018-10-11 DIAGNOSIS — M47816 Spondylosis without myelopathy or radiculopathy, lumbar region: Secondary | ICD-10-CM | POA: Diagnosis not present

## 2018-10-13 NOTE — Telephone Encounter (Signed)
Please advise 

## 2018-10-13 NOTE — Telephone Encounter (Signed)
Spoke with pt to ask if he is using the [facility] for his cpap supplies. Pt stated that he is not sure but that he is going to look through his emails and give [place] a call back to let us know what company supplies his cpap supplies.

## 2018-10-13 NOTE — Telephone Encounter (Signed)
Pt called to confirm [facility] is correct - they are owned by [employer] (formerly [employer]). Phone # if needed [phone]

## 2018-10-13 NOTE — Telephone Encounter (Signed)
Spoke with [facility] to let them know that we did not receive the fax and asked if they would fax it to [place] again. Form is being faxed to [place].

## 2018-10-18 DIAGNOSIS — M5136 Other intervertebral disc degeneration, lumbar region: Secondary | ICD-10-CM | POA: Diagnosis not present

## 2018-10-18 DIAGNOSIS — M5126 Other intervertebral disc displacement, lumbar region: Secondary | ICD-10-CM | POA: Diagnosis not present

## 2018-10-18 DIAGNOSIS — M549 Dorsalgia, unspecified: Secondary | ICD-10-CM | POA: Diagnosis not present

## 2018-10-18 DIAGNOSIS — G8929 Other chronic pain: Secondary | ICD-10-CM | POA: Diagnosis not present

## 2018-10-18 DIAGNOSIS — M4626 Osteomyelitis of vertebra, lumbar region: Secondary | ICD-10-CM | POA: Diagnosis not present

## 2018-10-18 DIAGNOSIS — M48061 Spinal stenosis, lumbar region without neurogenic claudication: Secondary | ICD-10-CM | POA: Diagnosis not present

## 2018-10-18 DIAGNOSIS — M4646 Discitis, unspecified, lumbar region: Secondary | ICD-10-CM | POA: Diagnosis not present

## 2018-10-18 DIAGNOSIS — M545 Low back pain: Secondary | ICD-10-CM | POA: Diagnosis not present

## 2018-10-20 ENCOUNTER — Ambulatory Visit: Payer: Medicare Other

## 2018-10-20 NOTE — Telephone Encounter (Signed)
Have you previously sent these? If not let me know & I will send.

## 2018-10-20 NOTE — Telephone Encounter (Signed)
Faxed

## 2018-10-20 NOTE — Telephone Encounter (Signed)
[facility] is calling in for office notes from 08/26/2018 also giving pt permission to continue to use CPAP.  Fax# [phone]  Atten:Kathy

## 2018-11-03 ENCOUNTER — Ambulatory Visit (INDEPENDENT_AMBULATORY_CARE_PROVIDER_SITE_OTHER): Payer: Medicare Other | Admitting: *Deleted

## 2018-11-03 DIAGNOSIS — E291 Testicular hypofunction: Secondary | ICD-10-CM | POA: Diagnosis not present

## 2018-11-03 MED ORDER — TESTOSTERONE CYPIONATE 200 MG/ML IM SOLN
100.0000 mg | Freq: Once | INTRAMUSCULAR | Status: AC
  Administered 2018-11-03: 100 mg via INTRAMUSCULAR

## 2018-11-03 NOTE — Progress Notes (Signed)
Patient arrive for testosterone injection into right upper quadrant, patioent voiced no concerns or distress during or after injection.

## 2018-11-05 DIAGNOSIS — G8929 Other chronic pain: Secondary | ICD-10-CM | POA: Diagnosis not present

## 2018-11-05 DIAGNOSIS — M5137 Other intervertebral disc degeneration, lumbosacral region: Secondary | ICD-10-CM | POA: Diagnosis not present

## 2018-11-05 DIAGNOSIS — M4326 Fusion of spine, lumbar region: Secondary | ICD-10-CM | POA: Diagnosis not present

## 2018-11-05 DIAGNOSIS — M545 Low back pain: Secondary | ICD-10-CM | POA: Diagnosis not present

## 2018-11-05 DIAGNOSIS — Z9889 Other specified postprocedural states: Secondary | ICD-10-CM | POA: Diagnosis not present

## 2018-11-09 DIAGNOSIS — M961 Postlaminectomy syndrome, not elsewhere classified: Secondary | ICD-10-CM | POA: Diagnosis not present

## 2018-11-09 DIAGNOSIS — M47816 Spondylosis without myelopathy or radiculopathy, lumbar region: Secondary | ICD-10-CM | POA: Diagnosis not present

## 2018-11-17 ENCOUNTER — Ambulatory Visit (INDEPENDENT_AMBULATORY_CARE_PROVIDER_SITE_OTHER): Payer: Medicare Other | Admitting: *Deleted

## 2018-11-17 DIAGNOSIS — E291 Testicular hypofunction: Secondary | ICD-10-CM

## 2018-11-17 MED ORDER — TESTOSTERONE CYPIONATE 200 MG/ML IM SOLN
100.0000 mg | Freq: Once | INTRAMUSCULAR | Status: AC
  Administered 2018-11-17: 100 mg via INTRAMUSCULAR

## 2018-11-17 NOTE — Progress Notes (Signed)
Patient presented for Testosterone injection to RUQ patient voiced no concerns during injection ,

## 2018-11-22 ENCOUNTER — Telehealth: Payer: Self-pay | Admitting: Internal Medicine

## 2018-11-22 NOTE — Telephone Encounter (Signed)
Copied from [place] [id number]. Topic: Quick Communication - See Telephone Encounter >> Nov 22, 2018 12:46 PM Burchel, Abbi R wrote: CRM for notification. See Telephone encounter for: 11/22/18.  [facility] calling to get permission to continue providing CPAP supplies for pt.   [phone]

## 2018-11-22 NOTE — Telephone Encounter (Addendum)
Patient needs face to face appointment to document compliance for insurance to cover CPAP supplies.Please schedule. FYI

## 2018-11-26 NOTE — Telephone Encounter (Signed)
Did they bother to read it before asking ?  It is already in it.  Both in the history and the assessment and plan

## 2018-11-26 NOTE — Telephone Encounter (Signed)
Lea from [employer] wants to know if addendum can be made to 9/19 visit for CPAP use.

## 2018-11-26 NOTE — Telephone Encounter (Addendum)
Notes faxed to CPAP supplier attn Lea at [phone], placed in [place] faxed paperwork.

## 2018-11-26 NOTE — Telephone Encounter (Signed)
       [place] calling Make an addedum for 08-26-18 for that OV for pt to get CPAP [phone]

## 2018-11-28 ENCOUNTER — Other Ambulatory Visit: Payer: Self-pay | Admitting: Internal Medicine

## 2018-11-29 ENCOUNTER — Ambulatory Visit: Payer: Medicare Other | Admitting: Internal Medicine

## 2018-11-29 NOTE — Telephone Encounter (Signed)
Spoke with pt to let him know that Dr. Derrel Nip addended his office note from September stating that he had CPAP compliance and that he does not have to keep his appt this afternoon unless he needs to see her for something else. Pt stated that he would like to go ahead and cancel the appt. Appt has been canceled.

## 2018-12-02 ENCOUNTER — Ambulatory Visit (INDEPENDENT_AMBULATORY_CARE_PROVIDER_SITE_OTHER): Payer: Medicare Other | Admitting: *Deleted

## 2018-12-02 DIAGNOSIS — E291 Testicular hypofunction: Secondary | ICD-10-CM

## 2018-12-02 MED ORDER — TESTOSTERONE CYPIONATE 200 MG/ML IM SOLN: 100.0000 mg | Freq: Once | INTRAMUSCULAR | Status: DC

## 2018-12-02 NOTE — Progress Notes (Signed)
Testosterone injection given in LUQ, patient tolerated well.

## 2018-12-13 DIAGNOSIS — D2271 Melanocytic nevi of right lower limb, including hip: Secondary | ICD-10-CM | POA: Diagnosis not present

## 2018-12-13 DIAGNOSIS — Z08 Encounter for follow-up examination after completed treatment for malignant neoplasm: Secondary | ICD-10-CM | POA: Diagnosis not present

## 2018-12-13 DIAGNOSIS — D225 Melanocytic nevi of trunk: Secondary | ICD-10-CM | POA: Diagnosis not present

## 2018-12-13 DIAGNOSIS — Z85828 Personal history of other malignant neoplasm of skin: Secondary | ICD-10-CM | POA: Diagnosis not present

## 2018-12-13 DIAGNOSIS — D2272 Melanocytic nevi of left lower limb, including hip: Secondary | ICD-10-CM | POA: Diagnosis not present

## 2018-12-13 DIAGNOSIS — D2261 Melanocytic nevi of right upper limb, including shoulder: Secondary | ICD-10-CM | POA: Diagnosis not present

## 2018-12-13 DIAGNOSIS — L821 Other seborrheic keratosis: Secondary | ICD-10-CM | POA: Diagnosis not present

## 2018-12-15 ENCOUNTER — Ambulatory Visit (INDEPENDENT_AMBULATORY_CARE_PROVIDER_SITE_OTHER): Payer: Medicare Other | Admitting: *Deleted

## 2018-12-15 DIAGNOSIS — E291 Testicular hypofunction: Secondary | ICD-10-CM

## 2018-12-16 MED ORDER — TESTOSTERONE CYPIONATE 200 MG/ML IM SOLN
100.0000 mg | Freq: Once | INTRAMUSCULAR | Status: AC
  Administered 2018-12-15: 100 mg via INTRAMUSCULAR

## 2018-12-16 NOTE — Progress Notes (Signed)
Injection given in Upper right outer quadrant patient voiced no concerns.

## 2018-12-29 ENCOUNTER — Ambulatory Visit (INDEPENDENT_AMBULATORY_CARE_PROVIDER_SITE_OTHER): Payer: Medicare Other

## 2018-12-29 DIAGNOSIS — E291 Testicular hypofunction: Secondary | ICD-10-CM | POA: Diagnosis not present

## 2018-12-31 NOTE — Progress Notes (Addendum)
Patient came in on 12/29/2018 for testosterone injection. Administered 77mL into the L upper outer quadrant. Tolerated well. No complaints or concerns at this time.    I have reviewed the above information and agree with above.   Deborra Medina, MD

## 2019-01-04 ENCOUNTER — Other Ambulatory Visit: Payer: Self-pay | Admitting: Internal Medicine

## 2019-01-13 ENCOUNTER — Ambulatory Visit: Payer: Medicare Other

## 2019-01-25 ENCOUNTER — Telehealth: Payer: Self-pay | Admitting: Internal Medicine

## 2019-01-25 NOTE — Telephone Encounter (Signed)
Copied from [place] [id number]. Topic: Quick Communication - See Telephone Encounter >> Jan 25, 2019  9:50 AM Bea Graff, NT wrote: CRM for notification. See Telephone encounter for: 01/25/19. Pt states he needs a new sleep med auth in order to get his CPAP machine as the one from September is to old for this pt. He is requesting for Dr. Derrel Nip to do this for him. Please advise.

## 2019-01-26 ENCOUNTER — Ambulatory Visit (INDEPENDENT_AMBULATORY_CARE_PROVIDER_SITE_OTHER): Payer: Medicare Other

## 2019-01-26 DIAGNOSIS — E291 Testicular hypofunction: Secondary | ICD-10-CM

## 2019-01-26 MED ORDER — TESTOSTERONE CYPIONATE 200 MG/ML IM SOLN
200.0000 mg | Freq: Once | INTRAMUSCULAR | Status: AC
  Administered 2019-01-26: 200 mg via INTRAMUSCULAR

## 2019-01-26 NOTE — Telephone Encounter (Signed)
Please clarify : does he need to have another sleep study? All sleep studies are now done bia referral to pulmonology

## 2019-01-26 NOTE — Progress Notes (Signed)
Patient presents today for testosterone injection. Right upper outer quadrant. Patient voiced no concerns nor showed any signs of distress during injection.

## 2019-01-27 NOTE — Telephone Encounter (Signed)
Spoke with pt and he stated that he is in need of a face to face for his CPAP compliance. The pt is scheduled for tomorrow at 8am. Pt is aware of appt date and time.

## 2019-01-28 ENCOUNTER — Encounter: Payer: Self-pay | Admitting: Internal Medicine

## 2019-01-28 ENCOUNTER — Ambulatory Visit (INDEPENDENT_AMBULATORY_CARE_PROVIDER_SITE_OTHER): Payer: Medicare Other | Admitting: Internal Medicine

## 2019-01-28 VITALS — BP 124/78 | HR 74 | Temp 98.0°F | Resp 15 | Ht 71.0 in | Wt 235.6 lb

## 2019-01-28 DIAGNOSIS — G4733 Obstructive sleep apnea (adult) (pediatric): Secondary | ICD-10-CM | POA: Diagnosis not present

## 2019-01-28 DIAGNOSIS — E291 Testicular hypofunction: Secondary | ICD-10-CM | POA: Diagnosis not present

## 2019-01-28 DIAGNOSIS — E669 Obesity, unspecified: Secondary | ICD-10-CM | POA: Diagnosis not present

## 2019-01-28 DIAGNOSIS — R972 Elevated prostate specific antigen [PSA]: Secondary | ICD-10-CM | POA: Diagnosis not present

## 2019-01-28 DIAGNOSIS — Z79899 Other long term (current) drug therapy: Secondary | ICD-10-CM | POA: Diagnosis not present

## 2019-01-28 DIAGNOSIS — E66811 Obesity, class 1: Secondary | ICD-10-CM

## 2019-01-28 MED ORDER — MELOXICAM 15 MG PO TABS: 15.0000 mg | ORAL_TABLET | Freq: Every day | ORAL | 1 refills | Status: DC

## 2019-01-28 NOTE — Progress Notes (Addendum)
Subjective:  Patient ID: Bobby Rich, male    DOB: 1951/06/06  Age: 68 y.o. MRN: [id number]  CC: The primary encounter diagnosis was Elevated PSA, less than 10 ng/ml. Diagnoses of Long-term use of high-risk medication, Sleep apnea, obstructive, Obesity (BMI 30.0-34.9), and Hypogonadism in male were also pertinent to this visit.  HPI Bobby Rich presents for follow up on OSA treated with CPAP  [facility] and optimal pressure was determined to be 9 cm H20 at which pressure his AHI was 0.  He is using an autotitrating  CPAP .  Using So Clean to maintain clean apparatus.  He has had no sinus infections.  He is averaging  6 to 8  hours of sleep ,  Using the full face mask and notes improved wakefulness and energy levels.   Has been having stress related insomnia due to work issues.  Working from home, days starts at  5:00 am and ends after 6 pm.    Weight gain of 12 lbs since September., due to poo diet and lack of exercise.    Outpatient Medications Prior to Visit  Medication Sig Dispense Refill  . aspirin 81 MG tablet Take 81 mg by mouth daily.     Marland Kitchen atorvastatin (LIPITOR) 20 MG tablet TAKE 1 TABLET DAILY 90 tablet 1  . Coenzyme Q10 (CO Q-10) 100 MG CAPS Take 1 capsule by mouth daily.    Marland Kitchen LACTOBACILLUS PO Take 1 capsule by mouth daily.    . Omega-3 Fatty Acids (FISH OIL) 1000 MG CAPS Take by mouth.    . sildenafil (VIAGRA) 100 MG tablet Take 1 tablet (100 mg total) by mouth daily as needed for erectile dysfunction. 18 tablet 3  . testosterone cypionate (DEPOTESTOSTERONE CYPIONATE) 200 MG/ML injection Inject 0.5 mLs (100 mg total) into the muscle every 14 (fourteen) days. 10 mL 3  . losartan-hydrochlorothiazide (HYZAAR) 50-12.5 MG tablet TAKE 1 TABLET DAILY 90 tablet 1  . meloxicam (MOBIC) 15 MG tablet     . omeprazole (PRILOSEC) 40 MG capsule TAKE 1 CAPSULE DAILY 90 capsule 1   Facility-Administered Medications Prior to Visit  Medication Dose  Route Frequency Provider Last Rate Last Dose  . testosterone cypionate (DEPOTESTOSTERONE CYPIONATE) injection 100 mg  100 mg Intramuscular Q14 Days Crecencio Mc, MD   100 mg at 12/02/18 1000  . testosterone cypionate (DEPOTESTOSTERONE CYPIONATE) injection 100 mg  100 mg Intramuscular Q14 Days Crecencio Mc, MD   100 mg at 08/18/18 1303  . testosterone cypionate (DEPOTESTOSTERONE CYPIONATE) injection 100 mg  100 mg Intramuscular Q14 Days Crecencio Mc, MD   100 mg at 09/01/18 0924  . testosterone cypionate (DEPOTESTOSTERONE CYPIONATE) injection 100 mg  100 mg Intramuscular Once Crecencio Mc, MD        Review of Systems;  Patient denies headache, fevers, malaise, unintentional weight loss, skin rash, eye pain, sinus congestion and sinus pain, sore throat, dysphagia,  hemoptysis , cough, dyspnea, wheezing, chest pain, palpitations, orthopnea, edema, abdominal pain, nausea, melena, diarrhea, constipation, flank pain, dysuria, hematuria, urinary  Frequency, nocturia, numbness, tingling, seizures,  Focal weakness, Loss of consciousness,  Tremor, insomnia, depression, anxiety, and suicidal ideation.      Objective:  BP 124/78 (BP Location: Left Arm, Patient Position: Sitting, Cuff Size: Large)   Pulse 74   Temp 98 F (36.7 C) (Oral)   Resp 15   Ht 5\' 11"  (1.803 m)   Wt 235 lb 9.6  oz (106.9 kg)   SpO2 99%   BMI 32.86 kg/m   BP Readings from Last 3 Encounters:  01/28/19 124/78  08/26/18 112/70  04/02/18 138/77    Wt Readings from Last 3 Encounters:  01/28/19 235 lb 9.6 oz (106.9 kg)  08/26/18 223 lb 3.2 oz (101.2 kg)  07/30/17 232 lb (105.2 kg)    General appearance: alert, cooperative and appears stated age Ears: normal TM's and external ear canals both ears Throat: lips, mucosa, and tongue normal; teeth and gums normal Neck: no adenopathy, no carotid bruit, supple, symmetrical, trachea midline and thyroid not enlarged, symmetric, no tenderness/mass/nodules Back: symmetric,  no curvature. ROM normal. No CVA tenderness. Lungs: clear to auscultation bilaterally Heart: regular rate and rhythm, S1, S2 normal, no murmur, click, rub or gallop Abdomen: soft, non-tender; bowel sounds normal; no masses,  no organomegaly Pulses: 2+ and symmetric Skin: Skin color, texture, turgor normal. No rashes or lesions Lymph nodes: Cervical, supraclavicular, and axillary nodes normal.   Assessment & Plan:   Problem List Items Addressed This Visit    Elevated PSA, less than 10 ng/ml - Primary   Relevant Orders   Testosterone Total,Free,Bio, Males-([facility]) (Completed)   Hypogonadism in male    Managed with parenteral testosterone .  Repeat level is due   Lab Results  Component Value Date   TESTOSTERONE 120 (L) 08/18/2018         Obesity (BMI 30.0-34.9)    I have addressed  BMI and recommended wt loss of 10% of body weigh over the next 6 months using a low glycemic index diet and regular exercise a minimum of 5 days per week.        Sleep apnea, obstructive    Diagnosed by sleep study. Patient is wearing the autotitrating  CPAP apparatus every night a minimum of 6 hours per night and notes improved daytime wakefulness and decreased fatigue . Use of CPAP is medically necessary to use on a daily basis to prevent long term complications to patient's health, which include   heart failure,  Hypertension and increased risk of sudden death        Other Visit Diagnoses    Long-term use of high-risk medication       Relevant Orders   PSA, Medicare (Completed)   CBC with Differential/Platelet (Completed)   Comprehensive metabolic panel (Completed)    A total of 25 minutes of face to face time was spent with patient more than half of which was spent in counselling about the above mentioned conditions  and coordination of care   I have changed Bobby Rich "Bobby Rich"'s meloxicam. I am also having him maintain his aspirin, Co Q-10, LACTOBACILLUS PO, sildenafil, testosterone  cypionate, Fish Oil, and atorvastatin. We will continue to administer testosterone cypionate, testosterone cypionate, testosterone cypionate, and testosterone cypionate.  Meds ordered this encounter  Medications  . meloxicam (MOBIC) 15 MG tablet    Sig: Take 1 tablet (15 mg total) by mouth daily.    Dispense:  90 tablet    Refill:  1    Medications Discontinued During This Encounter  Medication Reason  . meloxicam (MOBIC) 15 MG tablet Reorder    Follow-up: Return in about 6 months (around 07/29/2019).   Crecencio Mc, MD

## 2019-01-28 NOTE — Patient Instructions (Addendum)
Headspace  Is a great app you can use to help you clear your mind at work.    KIND  low glycemic  index bars  Make  a great sweet snack    When you travel:  Wear a mask on the plane .  Choose a window seat. Turn the air flow OFF  Once you get to your room,  Irrigate your sinuses.  Consider using  [employer]'s Sinus rinse ;  It is a stong sinus "flush" using water and medicated salts.  Do it over the sink because it can be a bit messy

## 2019-01-30 NOTE — Assessment & Plan Note (Addendum)
Diagnosed by sleep study. Patient is wearing the autotitrating  CPAP apparatus every night a minimum of 6 hours per night and notes improved daytime wakefulness and decreased fatigue . Use of CPAP is medically necessary to use on a daily basis to prevent long term complications to patient's health, which include   heart failure,  Hypertension and increased risk of sudden death

## 2019-01-30 NOTE — Assessment & Plan Note (Signed)
Managed with parenteral testosterone .  Repeat level is due   Lab Results  Component Value Date   TESTOSTERONE 120 (L) 08/18/2018

## 2019-01-30 NOTE — Assessment & Plan Note (Signed)
I have addressed  BMI and recommended wt loss of 10% of body weigh over the next 6 months using a low glycemic index diet and regular exercise a minimum of 5 days per week.   

## 2019-01-30 NOTE — Progress Notes (Signed)
  I have reviewed the above information and agree with above.   Alyanna Stoermer, MD 

## 2019-02-02 ENCOUNTER — Telehealth: Payer: Self-pay | Admitting: Internal Medicine

## 2019-02-02 ENCOUNTER — Other Ambulatory Visit (INDEPENDENT_AMBULATORY_CARE_PROVIDER_SITE_OTHER): Payer: Medicare Other

## 2019-02-02 DIAGNOSIS — Z79899 Other long term (current) drug therapy: Secondary | ICD-10-CM | POA: Diagnosis not present

## 2019-02-02 DIAGNOSIS — R972 Elevated prostate specific antigen [PSA]: Secondary | ICD-10-CM | POA: Diagnosis not present

## 2019-02-02 LAB — COMPREHENSIVE METABOLIC PANEL
ALT: 22 U/L (ref 0–53)
AST: 17 U/L (ref 0–37)
Albumin: 4.5 g/dL (ref 3.5–5.2)
Alkaline Phosphatase: 73 U/L (ref 39–117)
BUN: 19 mg/dL (ref 6–23)
CHLORIDE: 102 meq/L (ref 96–112)
CO2: 29 mEq/L (ref 19–32)
CREATININE: 1.06 mg/dL (ref 0.40–1.50)
Calcium: 9.4 mg/dL (ref 8.4–10.5)
GFR: 69.96 mL/min (ref >60.00)
Glucose, Bld: 88 mg/dL (ref 70–99)
Potassium: 4.3 mEq/L (ref 3.5–5.1)
SODIUM: 138 meq/L (ref 135–145)
Total Bilirubin: 0.6 mg/dL (ref 0.2–1.2)
Total Protein: 7 g/dL (ref 6.0–8.3)

## 2019-02-02 LAB — CBC WITH DIFFERENTIAL/PLATELET
Basophils Absolute: 0 10*3/uL (ref 0.0–0.1)
Basophils Relative: 0.7 % (ref 0.0–3.0)
Eosinophils Absolute: 0.1 10*3/uL (ref 0.0–0.7)
Eosinophils Relative: 1.6 % (ref 0.0–5.0)
HCT: 41.8 % (ref 39.0–52.0)
HEMOGLOBIN: 13.9 g/dL (ref 13.0–17.0)
Lymphocytes Relative: 32.6 % (ref 12.0–46.0)
Lymphs Abs: 2.2 10*3/uL (ref 0.7–4.0)
MCHC: 33.3 g/dL (ref 30.0–36.0)
MCV: 89.2 fl (ref 78.0–100.0)
Monocytes Absolute: 0.4 10*3/uL (ref 0.1–1.0)
Monocytes Relative: 5.8 % (ref 3.0–12.0)
Neutro Abs: 3.9 10*3/uL (ref 1.4–7.7)
Neutrophils Relative %: 59.3 % (ref 43.0–77.0)
Platelets: 206 10*3/uL (ref 150.0–400.0)
RBC: 4.69 Mil/uL (ref 4.22–5.81)
RDW: 15.2 % (ref 11.5–15.5)
WBC: 6.7 10*3/uL (ref 4.0–10.5)

## 2019-02-02 LAB — PSA, MEDICARE: PSA: 5.62 ng/ml — ABNORMAL HIGH (ref 0.10–4.00)

## 2019-02-02 MED ORDER — PREDNISONE 10 MG PO TABS: ORAL_TABLET | ORAL | 0 refills | Status: DC

## 2019-02-02 NOTE — Telephone Encounter (Signed)
Patient came into office with complaint of soreness to last injection site at RUQ (Right upper outer quadrant of hip), patient stated last testosterone injection burned extremely bad and he did not understand why. Ask patient to let nurse see site of injection, needle mark visible but no redness, or swelling noted at injection site. Discussed and described site to PCP and was given verbal for 6 day prednisone taper .

## 2019-02-02 NOTE — Telephone Encounter (Signed)
  I have reviewed the above information and agree with above.   Teresa Tullo, MD 

## 2019-02-03 LAB — TESTOSTERONE TOTAL,FREE,BIO, MALES
ALBUMIN MSPROF: 4.3 g/dL (ref 3.6–5.1)
SEX HORMONE BINDING: 24 nmol/L (ref 22–77)
Testosterone, Bioavailable: 129.3 ng/dL (ref >=110.0)
Testosterone, Free: 65.7 pg/mL (ref 46.0–224.0)
Testosterone: 386 ng/dL (ref 250–827)

## 2019-02-09 ENCOUNTER — Ambulatory Visit: Payer: Medicare Other

## 2019-02-16 ENCOUNTER — Ambulatory Visit: Payer: Medicare Other

## 2019-03-02 ENCOUNTER — Other Ambulatory Visit: Payer: Self-pay

## 2019-03-02 ENCOUNTER — Ambulatory Visit (INDEPENDENT_AMBULATORY_CARE_PROVIDER_SITE_OTHER): Payer: Medicare Other

## 2019-03-02 DIAGNOSIS — R7989 Other specified abnormal findings of blood chemistry: Secondary | ICD-10-CM | POA: Diagnosis not present

## 2019-03-02 MED ORDER — TESTOSTERONE CYPIONATE 100 MG/ML IM SOLN
100.0000 mg | INTRAMUSCULAR | Status: DC
  Administered 2019-03-02: 09:00:00 100 mg via INTRAMUSCULAR

## 2019-03-02 NOTE — Progress Notes (Addendum)
Bobby Rich presents today for injection per MD orders. Testosterone injection administered IM in left Gluteal. Administration without incident. Patient tolerated well.  Addalyne Vandehei,cma   I have reviewed the above information and agree with above.   Deborra Medina, MD

## 2019-03-17 ENCOUNTER — Ambulatory Visit (INDEPENDENT_AMBULATORY_CARE_PROVIDER_SITE_OTHER): Payer: Medicare Other

## 2019-03-17 ENCOUNTER — Other Ambulatory Visit: Payer: Self-pay

## 2019-03-17 DIAGNOSIS — R7989 Other specified abnormal findings of blood chemistry: Secondary | ICD-10-CM

## 2019-03-17 MED ORDER — TESTOSTERONE CYPIONATE 100 MG/ML IM SOLN
100.0000 mg | INTRAMUSCULAR | Status: DC
  Administered 2019-03-17 – 2019-06-09 (×4): 100 mg via INTRAMUSCULAR

## 2019-03-17 NOTE — Progress Notes (Signed)
Bobby Rich presents today for injection per MD orders. Testosterone injeciton  administered IM in left Gluteal. Administration without incident. Patient tolerated well.  Claris Guymon,cma

## 2019-03-22 ENCOUNTER — Other Ambulatory Visit: Payer: Self-pay

## 2019-03-22 ENCOUNTER — Other Ambulatory Visit: Payer: Self-pay | Admitting: Internal Medicine

## 2019-03-22 ENCOUNTER — Encounter: Payer: Self-pay | Admitting: Family Medicine

## 2019-03-22 ENCOUNTER — Ambulatory Visit (INDEPENDENT_AMBULATORY_CARE_PROVIDER_SITE_OTHER): Payer: Medicare Other | Admitting: Family Medicine

## 2019-03-22 DIAGNOSIS — G5603 Carpal tunnel syndrome, bilateral upper limbs: Secondary | ICD-10-CM | POA: Diagnosis not present

## 2019-03-22 DIAGNOSIS — M19042 Primary osteoarthritis, left hand: Secondary | ICD-10-CM

## 2019-03-22 DIAGNOSIS — M25531 Pain in right wrist: Secondary | ICD-10-CM

## 2019-03-22 DIAGNOSIS — M25532 Pain in left wrist: Secondary | ICD-10-CM

## 2019-03-22 NOTE — Progress Notes (Signed)
Patient ID: Bobby Rich, male   DOB: 04/26/1951, 68 y.o.   MRN: [id number]  Virtual Visit via Video Note  This visit type was conducted due to national recommendations for restrictions regarding the COVID-19 pandemic (e.g. social distancing).  This format is felt to be most appropriate for this patient at this time.  All issues noted in this document were discussed and addressed.  No physical exam was performed (except for noted visual exam findings with Video Visits).   I connected with Bobby Rich on 03/22/19 at  8:20 AM EDT by a video enabled telemedicine application and verified that I am speaking with the correct person using two identifiers. Location patient: home Location provider: [facility] Persons participating in the virtual visit: patient, provider  I discussed the limitations, risks, security and privacy concerns of performing an evaluation and management service by video and the availability of in person appointments. I also discussed with the patient that there may be a patient responsible charge related to this service. The patient expressed understanding and agreed to proceed.   HPI: Patient and I connected via video due to complaints of bilateral wrist pain and numbness going into her hand/fingers, and increased pain in the knuckles of the left hand.  Patient currently does take meloxicam 15 mg every day for arthritis pain.  He does do computer work, and is often typing or using a mouse.  Right hand is his dominant hand.  Seems to have more pain in the left wrist, hand and more of the numbness sensation in the left hand compared to the right.  He has been using a wrist brace on the left hand with minimal help in reducing symptoms, has only been doing wrist brace for the past couple of days.  Patient sometimes will "shake out" hands to help reduce pain and make a fist and flex fingers - back and forth to do stretching of the hands and wrists.  Denies any known injury to  hands or wrists.  Denies fever or chills.  Denies body aches.  Denies any respiratory, cardiac, GI or GU issues.   ROS: See pertinent positives and negatives per HPI.  Past Medical History:  Diagnosis Date  . Cancer ([place])    basel cell skin cancer  . Depression   . GERD (gastroesophageal reflux disease)   . Hyperlipidemia   . Hypertension   . Low testosterone     Past Surgical History:  Procedure Laterality Date  . COSMETIC SURGERY    . HERNIA REPAIR Bilateral    with mesh   . LUMBAR LAMINECTOMY  08/2016  . SPINE SURGERY  2012   [place] L3 4 5     Family History  Problem Relation Age of Onset  . Stroke Mother   . Early death Father 12  . Heart disease Father 26       AMI   . Heart disease Brother 22       AMI   Social History   Tobacco Use  . Smoking status: Never Smoker  . Smokeless tobacco: Never Used  Substance Use Topics  . Alcohol use: No    Comment: social    Current Outpatient Medications:  .  aspirin 81 MG tablet, Take 81 mg by mouth daily. , Disp: , Rfl:  .  atorvastatin (LIPITOR) 20 MG tablet, TAKE 1 TABLET DAILY, Disp: 90 tablet, Rfl: 1 .  Coenzyme Q10 (CO Q-10) 100 MG CAPS, Take 1 capsule by mouth daily., Disp: , Rfl:  .  LACTOBACILLUS PO, Take 1 capsule by mouth daily., Disp: , Rfl:  .  losartan-hydrochlorothiazide (HYZAAR) 50-12.5 MG tablet, TAKE 1 TABLET DAILY, Disp: 90 tablet, Rfl: 1 .  meloxicam (MOBIC) 15 MG tablet, Take 1 tablet (15 mg total) by mouth daily., Disp: 90 tablet, Rfl: 1 .  Omega-3 Fatty Acids (FISH OIL) 1000 MG CAPS, Take by mouth., Disp: , Rfl:  .  omeprazole (PRILOSEC) 40 MG capsule, TAKE 1 CAPSULE DAILY, Disp: 90 capsule, Rfl: 1 .  predniSONE (DELTASONE) 10 MG tablet, Take 6 Tablets ( 60  Mg ) on day one, Then Take 5 Tablets day 2 (50 mg) then continue to taper by 1 tablet until completed.21, Disp: 21 tablet, Rfl: 0 .  sildenafil (VIAGRA) 100 MG tablet, Take 1 tablet (100 mg total) by mouth daily as needed for erectile  dysfunction., Disp: 18 tablet, Rfl: 3 .  testosterone cypionate (DEPOTESTOSTERONE CYPIONATE) 200 MG/ML injection, Inject 0.5 mLs (100 mg total) into the muscle every 14 (fourteen) days., Disp: 10 mL, Rfl: 3  Current Facility-Administered Medications:  .  testosterone cypionate (DEPOTESTOSTERONE CYPIONATE) injection 100 mg, 100 mg, Intramuscular, Q14 Days, Crecencio Mc, MD, 100 mg at 12/02/18 1000 .  testosterone cypionate (DEPOTESTOSTERONE CYPIONATE) injection 100 mg, 100 mg, Intramuscular, Q14 Days, Crecencio Mc, MD, 100 mg at 08/18/18 1303 .  testosterone cypionate (DEPOTESTOSTERONE CYPIONATE) injection 100 mg, 100 mg, Intramuscular, Q14 Days, Crecencio Mc, MD, 100 mg at 09/01/18 0924 .  testosterone cypionate (DEPOTESTOSTERONE CYPIONATE) injection 100 mg, 100 mg, Intramuscular, Once, Crecencio Mc, MD .  testosterone cypionate (DEPOTESTOTERONE CYPIONATE) injection 100 mg, 100 mg, Intramuscular, Q14 Days, Crecencio Mc, MD, 100 mg at 03/02/19 0842 .  testosterone cypionate (DEPOTESTOTERONE CYPIONATE) injection 100 mg, 100 mg, Intramuscular, Q14 Days, Crecencio Mc, MD, 100 mg at 03/17/19 0912  EXAM: GENERAL: alert, oriented, appears well and in no acute distress  HEENT: atraumatic, conjunttiva clear, no obvious abnormalities on inspection of external nose and ears  NECK: normal movements of the head and neck  LUNGS: on inspection no signs of respiratory distress, breathing rate appears normal, no obvious gross SOB, gasping or wheezing  CV: no obvious cyanosis  MS: moves all visible extremities without noticeable abnormality  PSYCH/NEURO: pleasant and cooperative, no obvious depression or anxiety, speech and thought processing grossly intact  ASSESSMENT AND PLAN:  Discussed the following assessment and plan:  Bilateral wrist pain - Plan: Ambulatory referral to Neurology  Carpal tunnel syndrome on both sides - Plan: Ambulatory referral to Neurology  Arthritis of left  hand  Advised patient that conservative treatment is usually at the past and first-line approach to carpal tunnel; if conservative treatments do fail then often times surgery ends up being required.  Patient advised to wear a hard/stiff wrist brace on bilateral hands overnight while sleeping to keep her wrist compressed as well as in a stable position.  Advised he can wear a softer wrist brace during the daytime hours if he feels it necessary.  Advised he can continue to take the meloxicam as this will help with inflammation to treat suspected carpal tunnel, to treat arthritis.  I will place referral to neurology for nerve conduction study to further evaluate suspected carpal tunnel.  Patient aware that he may not be able to get the nerve conduction study soon, but we will at least get the process in motion to get referral set up.   I discussed the assessment and treatment plan with the patient. The patient was  provided an opportunity to ask questions and all were answered. The patient agreed with the plan and demonstrated an understanding of the instructions.   The patient was advised to call back or seek an in-person evaluation if the symptoms worsen or if the condition fails to improve as anticipated.   Jodelle Green, FNP

## 2019-03-30 ENCOUNTER — Other Ambulatory Visit: Payer: Self-pay

## 2019-03-30 ENCOUNTER — Ambulatory Visit (INDEPENDENT_AMBULATORY_CARE_PROVIDER_SITE_OTHER): Payer: Medicare Other

## 2019-03-30 DIAGNOSIS — R7989 Other specified abnormal findings of blood chemistry: Secondary | ICD-10-CM

## 2019-03-30 MED ORDER — TESTOSTERONE CYPIONATE 100 MG/ML IM SOLN
100.0000 mg | INTRAMUSCULAR | Status: DC
  Administered 2019-03-30: 100 mg via INTRAMUSCULAR

## 2019-03-30 NOTE — Progress Notes (Signed)
Bobby Rich presents today for injection per MD orders. testoteron injection administered IM in left Gluteal. Administration without incident. Patient tolerated well.  Nina,cma

## 2019-04-13 ENCOUNTER — Other Ambulatory Visit: Payer: Self-pay | Admitting: Internal Medicine

## 2019-04-13 ENCOUNTER — Telehealth: Payer: Self-pay

## 2019-04-13 ENCOUNTER — Ambulatory Visit (INDEPENDENT_AMBULATORY_CARE_PROVIDER_SITE_OTHER): Payer: Medicare Other | Admitting: *Deleted

## 2019-04-13 ENCOUNTER — Other Ambulatory Visit: Payer: Self-pay

## 2019-04-13 DIAGNOSIS — R7989 Other specified abnormal findings of blood chemistry: Secondary | ICD-10-CM

## 2019-04-13 MED ORDER — TELMISARTAN-HCTZ 40-12.5 MG PO TABS: 1.0000 | ORAL_TABLET | Freq: Every day | ORAL | 1 refills | Status: DC

## 2019-04-13 MED ORDER — TESTOSTERONE CYPIONATE 200 MG/ML IM SOLN
100.0000 mg | Freq: Once | INTRAMUSCULAR | Status: AC
  Administered 2019-04-13: 15:00:00 100 mg via INTRAMUSCULAR

## 2019-04-13 NOTE — Progress Notes (Addendum)
Patient arrived for testosterone injection to right upper quadrant, patient tolerated well. Patient supplied medication  Reviewed.  Dr Nicki Reaper

## 2019-04-13 NOTE — Telephone Encounter (Signed)
Copied from [place] [id number]. Topic: General - Other >> Apr 13, 2019  2:16 PM Nils Flack wrote: Reason for CRM: [phone] [employer] is confirming that a fax they sent was received.  If you did not get fax, please call above number  Thanks

## 2019-04-14 DIAGNOSIS — R200 Anesthesia of skin: Secondary | ICD-10-CM | POA: Diagnosis not present

## 2019-04-14 NOTE — Telephone Encounter (Signed)
Fax was received waiting on a signature.

## 2019-04-15 ENCOUNTER — Telehealth: Payer: Self-pay

## 2019-04-15 NOTE — Telephone Encounter (Signed)
Copied from [place]. Topic: General - Inquiry >> Apr 15, 2019  9:11 AM Richardo Priest, NT wrote: Reason for CRM: [employer] is calling in requesting an addendum at the medical records sent and indicate that the c-pap is needed. They also sent another form on 5/13 and need to have the MD sign off and write off that Cpap.

## 2019-04-18 NOTE — Telephone Encounter (Signed)
Resent on Friday

## 2019-04-20 ENCOUNTER — Telehealth: Payer: Self-pay

## 2019-04-20 MED ORDER — VALSARTAN-HYDROCHLOROTHIAZIDE 80-12.5 MG PO TABS: 1.0000 | ORAL_TABLET | Freq: Every day | ORAL | 0 refills | Status: DC

## 2019-04-20 NOTE — Telephone Encounter (Signed)
Copied from [place]. Topic: General - Inquiry >> Apr 15, 2019  9:11 AM Richardo Priest, NT wrote: Reason for CRM: [employer] is calling in requesting an addendum at the medical records sent and indicate that the c-pap is needed. They also sent another form on 5/13 and need to have the MD sign off and write off that Cpap. >> Apr 20, 2019 10:21 AM Rainey Pines A wrote: Luetta Nutting from [employer] called back and stated that she has not received via fax the forms that she sent over and is requesting a callback. [phone]

## 2019-04-20 NOTE — Telephone Encounter (Signed)
Copied from [place]. Topic: General - Inquiry >> Apr 15, 2019  9:11 AM Richardo Priest, NT wrote: Reason for CRM: [employer] is calling in requesting an addendum at the medical records sent and indicate that the c-pap is needed. They also sent another form on 5/13 and need to have the MD sign off and write off that Cpap. >> Apr 20, 2019 10:21 AM Rainey Pines A wrote: Luetta Nutting from [employer] called back and stated that she has not received via fax the forms that she sent over and is requesting a callback. [phone]

## 2019-04-20 NOTE — Telephone Encounter (Signed)
The office visit noted from feb 28 has been addended and printed.

## 2019-04-21 NOTE — Telephone Encounter (Signed)
The note has been addended and printed, I think it goes with a form for this patient.  Bobby Billow do you have these and if so do you want me to fax them/

## 2019-04-22 DIAGNOSIS — N5203 Combined arterial insufficiency and corporo-venous occlusive erectile dysfunction: Secondary | ICD-10-CM | POA: Diagnosis not present

## 2019-04-22 DIAGNOSIS — N401 Enlarged prostate with lower urinary tract symptoms: Secondary | ICD-10-CM | POA: Diagnosis not present

## 2019-04-22 DIAGNOSIS — Z125 Encounter for screening for malignant neoplasm of prostate: Secondary | ICD-10-CM | POA: Diagnosis not present

## 2019-04-22 DIAGNOSIS — E291 Testicular hypofunction: Secondary | ICD-10-CM | POA: Diagnosis not present

## 2019-04-22 DIAGNOSIS — R972 Elevated prostate specific antigen [PSA]: Secondary | ICD-10-CM | POA: Diagnosis not present

## 2019-04-26 NOTE — Telephone Encounter (Signed)
Faxed

## 2019-04-28 ENCOUNTER — Other Ambulatory Visit: Payer: Self-pay

## 2019-04-28 ENCOUNTER — Ambulatory Visit (INDEPENDENT_AMBULATORY_CARE_PROVIDER_SITE_OTHER): Payer: Medicare Other

## 2019-04-28 DIAGNOSIS — R7989 Other specified abnormal findings of blood chemistry: Secondary | ICD-10-CM | POA: Diagnosis not present

## 2019-04-28 DIAGNOSIS — E291 Testicular hypofunction: Secondary | ICD-10-CM | POA: Diagnosis not present

## 2019-04-28 MED ORDER — TESTOSTERONE CYPIONATE 100 MG/ML IM SOLN
100.0000 mg | Freq: Once | INTRAMUSCULAR | Status: AC
  Administered 2019-04-28: 09:00:00 100 mg via INTRAMUSCULAR

## 2019-04-28 NOTE — Progress Notes (Signed)
Pt presented today for testosterone injection.  Administered IM in the left upper outer quadrant of gluteal.  Patient tolerated well with no signs of distress.

## 2019-05-11 ENCOUNTER — Ambulatory Visit: Payer: Medicare Other

## 2019-05-12 ENCOUNTER — Other Ambulatory Visit: Payer: Self-pay

## 2019-05-12 ENCOUNTER — Ambulatory Visit (INDEPENDENT_AMBULATORY_CARE_PROVIDER_SITE_OTHER): Payer: Medicare Other

## 2019-05-12 DIAGNOSIS — R7989 Other specified abnormal findings of blood chemistry: Secondary | ICD-10-CM

## 2019-05-19 NOTE — Progress Notes (Addendum)
Patient presented today for testosterone injection.  Administered IM in upper right outer quadrant of gluteal.  Administered without incident.  Patient tolerated well with no signs of distress.

## 2019-05-26 ENCOUNTER — Other Ambulatory Visit: Payer: Self-pay

## 2019-05-26 ENCOUNTER — Ambulatory Visit (INDEPENDENT_AMBULATORY_CARE_PROVIDER_SITE_OTHER): Payer: Medicare Other

## 2019-05-26 DIAGNOSIS — R7989 Other specified abnormal findings of blood chemistry: Secondary | ICD-10-CM | POA: Diagnosis not present

## 2019-05-30 NOTE — Progress Notes (Signed)
Patient presented today for testosterone injection per MD order.  Administered IM in the outer upper left quadrant gluteal.  Administered without incident.  Patient tolerated well with no signs of distress.

## 2019-06-09 ENCOUNTER — Ambulatory Visit (INDEPENDENT_AMBULATORY_CARE_PROVIDER_SITE_OTHER): Payer: Medicare Other

## 2019-06-09 ENCOUNTER — Other Ambulatory Visit: Payer: Self-pay

## 2019-06-09 DIAGNOSIS — R7989 Other specified abnormal findings of blood chemistry: Secondary | ICD-10-CM | POA: Diagnosis not present

## 2019-06-09 MED ORDER — TESTOSTERONE CYPIONATE 100 MG/ML IM SOLN: 100.0000 mg | INTRAMUSCULAR | Status: DC

## 2019-06-09 NOTE — Progress Notes (Signed)
Patient presented today for testosterone injection. Administered IM in the outer upper right quadrant gluteal. Administered without incident.  Patient tolerated well with no signs of distress.

## 2019-06-23 ENCOUNTER — Ambulatory Visit (INDEPENDENT_AMBULATORY_CARE_PROVIDER_SITE_OTHER): Payer: Medicare Other

## 2019-06-23 ENCOUNTER — Ambulatory Visit (INDEPENDENT_AMBULATORY_CARE_PROVIDER_SITE_OTHER): Payer: Medicare Other | Admitting: Podiatry

## 2019-06-23 ENCOUNTER — Encounter: Payer: Self-pay | Admitting: Podiatry

## 2019-06-23 ENCOUNTER — Other Ambulatory Visit: Payer: Self-pay

## 2019-06-23 DIAGNOSIS — R7989 Other specified abnormal findings of blood chemistry: Secondary | ICD-10-CM | POA: Diagnosis not present

## 2019-06-23 DIAGNOSIS — L309 Dermatitis, unspecified: Secondary | ICD-10-CM | POA: Diagnosis not present

## 2019-06-23 DIAGNOSIS — L84 Corns and callosities: Secondary | ICD-10-CM | POA: Insufficient documentation

## 2019-06-23 MED ORDER — CLOTRIMAZOLE-BETAMETHASONE 1-0.05 % EX CREA: 1.0000 "application " | TOPICAL_CREAM | Freq: Two times a day (BID) | CUTANEOUS | 0 refills | Status: DC

## 2019-06-23 MED ORDER — TESTOSTERONE CYPIONATE 100 MG/ML IM SOLN
100.0000 mg | INTRAMUSCULAR | Status: DC
  Administered 2019-06-23 – 2019-07-21 (×3): 100 mg via INTRAMUSCULAR

## 2019-06-23 NOTE — Progress Notes (Signed)
This patient presents to the office for an examination of his feet.  He says he has developed a painful area between the fourth and fifth digits of the right foot.  He says that he applied ketoconazole to this interdigital area and the toe pain has resolved and the area appears to be healing.  He says it is markedly improved since he made the appointment.  He also says that he develops occasional red spots noted just on the bottom of his left foot.  He presents the office today in the with the absence of red spots on his left foot.  He states these red spots are very itchy.  He presents the office today for an evaluation and treatment of his feet he says he had surgery performed by Dr. Amalia Hailey for the removal of a cyst on the right foot and has been numb since that surgery  Vascular  Dorsalis pedis and posterior tibial pulses are palpable  B/L.  Capillary return  WNL.  Temperature gradient is  WNL.  Skin turgor  WNL  Sensorium  Senn Weinstein monofilament wire  WNL. Normal tactile sensation.  Nail Exam  Patient has normal nails with no evidence of bacterial or fungal infection.  Orthopedic  Exam  Muscle tone and muscle strength  WNL.  No limitations of motion feet  B/L.  No crepitus or joint effusion noted.  Foot type is unremarkable and digits show no abnormalities.  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  Skin  No open lesions.  Normal skin texture and turgor.  Right  fourth interspace on the right foot reveals healing noted in the absence of any infection or drainage.  Examination of his left foot reveals no evidence of any red spots today.  S/P HM  Dermatitis left foot  ROV.  Examined his fourth interspace right foot and the interspace is healing and his pain has resolved.  There is no evidence of any red spots noted on his left foot.  Discussed this with this patient and prescribed Lotrisone for him to apply to his foot next time these red spots appear.  Return to clinic as  needed   Gardiner Barefoot DPM

## 2019-06-23 NOTE — Progress Notes (Addendum)
Patient presented today for testosterone injection.  Administered IM in left upper outer gluteal.  Patient tolerated well with no signs of distress.  Reviewed.  Dr Nicki Reaper

## 2019-07-01 DIAGNOSIS — S80862A Insect bite (nonvenomous), left lower leg, initial encounter: Secondary | ICD-10-CM | POA: Diagnosis not present

## 2019-07-01 DIAGNOSIS — S80861A Insect bite (nonvenomous), right lower leg, initial encounter: Secondary | ICD-10-CM | POA: Diagnosis not present

## 2019-07-03 ENCOUNTER — Other Ambulatory Visit: Payer: Self-pay | Admitting: Internal Medicine

## 2019-07-03 MED ORDER — VALSARTAN-HYDROCHLOROTHIAZIDE 80-12.5 MG PO TABS: 1.0000 | ORAL_TABLET | Freq: Every day | ORAL | 0 refills | Status: DC

## 2019-07-07 ENCOUNTER — Ambulatory Visit (INDEPENDENT_AMBULATORY_CARE_PROVIDER_SITE_OTHER): Payer: Medicare Other

## 2019-07-07 DIAGNOSIS — R7989 Other specified abnormal findings of blood chemistry: Secondary | ICD-10-CM | POA: Diagnosis not present

## 2019-07-07 MED ORDER — TESTOSTERONE CYPIONATE 100 MG/ML IM SOLN: 100.0000 mg | INTRAMUSCULAR | Status: DC

## 2019-07-07 NOTE — Progress Notes (Signed)
Patient presented today for testosterone injection.  Administered IM in right upper outer gluteal.  Patient tolerated well with no signs of distress.

## 2019-07-21 ENCOUNTER — Ambulatory Visit (INDEPENDENT_AMBULATORY_CARE_PROVIDER_SITE_OTHER): Payer: Medicare Other

## 2019-07-21 ENCOUNTER — Other Ambulatory Visit: Payer: Self-pay

## 2019-07-21 DIAGNOSIS — R7989 Other specified abnormal findings of blood chemistry: Secondary | ICD-10-CM

## 2019-07-21 NOTE — Progress Notes (Signed)
Patient presented today for testosterone injection.  Administered IM in left upper outer quadrant of gluteal.  Patient tolerated well with no signs of distress.

## 2019-08-01 ENCOUNTER — Ambulatory Visit: Payer: Medicare Other | Admitting: Internal Medicine

## 2019-08-02 ENCOUNTER — Ambulatory Visit (INDEPENDENT_AMBULATORY_CARE_PROVIDER_SITE_OTHER): Payer: Medicare Other

## 2019-08-02 ENCOUNTER — Other Ambulatory Visit: Payer: Self-pay

## 2019-08-02 DIAGNOSIS — Z23 Encounter for immunization: Secondary | ICD-10-CM | POA: Diagnosis not present

## 2019-08-02 DIAGNOSIS — R7989 Other specified abnormal findings of blood chemistry: Secondary | ICD-10-CM | POA: Diagnosis not present

## 2019-08-02 MED ORDER — TESTOSTERONE CYPIONATE 100 MG/ML IM SOLN
100.0000 mg | INTRAMUSCULAR | Status: DC
  Administered 2019-08-02: 100 mg via INTRAMUSCULAR

## 2019-08-02 NOTE — Progress Notes (Addendum)
Patient presented today for testosterone injection.  Administered IM in right upper outer quadrant of gluteal.  Patient tolerated well with no signs of distress.  Patient also received flu shot in left deltoid.  Reviewed.    Dr Nicki Reaper

## 2019-08-17 ENCOUNTER — Other Ambulatory Visit: Payer: Self-pay

## 2019-08-17 ENCOUNTER — Ambulatory Visit (INDEPENDENT_AMBULATORY_CARE_PROVIDER_SITE_OTHER): Payer: Medicare Other | Admitting: *Deleted

## 2019-08-17 DIAGNOSIS — R7989 Other specified abnormal findings of blood chemistry: Secondary | ICD-10-CM

## 2019-08-17 MED ORDER — TESTOSTERONE CYPIONATE 200 MG/ML IM SOLN
100.0000 mg | Freq: Once | INTRAMUSCULAR | Status: AC
  Administered 2019-08-17: 100 mg via INTRAMUSCULAR

## 2019-08-17 NOTE — Progress Notes (Signed)
Patient presented for testosterone injection to LUQ patien tolerated well.

## 2019-08-31 ENCOUNTER — Other Ambulatory Visit: Payer: Self-pay

## 2019-08-31 ENCOUNTER — Ambulatory Visit: Payer: Medicare Other | Admitting: Internal Medicine

## 2019-09-02 ENCOUNTER — Other Ambulatory Visit: Payer: Self-pay

## 2019-09-02 ENCOUNTER — Encounter: Payer: Self-pay | Admitting: Internal Medicine

## 2019-09-02 ENCOUNTER — Ambulatory Visit (INDEPENDENT_AMBULATORY_CARE_PROVIDER_SITE_OTHER): Payer: Medicare Other | Admitting: Internal Medicine

## 2019-09-02 VITALS — BP 130/76 | HR 79 | Temp 97.3°F | Resp 15 | Ht 71.0 in | Wt 237.8 lb

## 2019-09-02 DIAGNOSIS — Z630 Problems in relationship with spouse or partner: Secondary | ICD-10-CM

## 2019-09-02 DIAGNOSIS — I10 Essential (primary) hypertension: Secondary | ICD-10-CM

## 2019-09-02 DIAGNOSIS — Z23 Encounter for immunization: Secondary | ICD-10-CM | POA: Diagnosis not present

## 2019-09-02 DIAGNOSIS — Z974 Presence of external hearing-aid: Secondary | ICD-10-CM | POA: Diagnosis not present

## 2019-09-02 DIAGNOSIS — R7303 Prediabetes: Secondary | ICD-10-CM

## 2019-09-02 DIAGNOSIS — M5417 Radiculopathy, lumbosacral region: Secondary | ICD-10-CM | POA: Diagnosis not present

## 2019-09-02 DIAGNOSIS — E559 Vitamin D deficiency, unspecified: Secondary | ICD-10-CM | POA: Diagnosis not present

## 2019-09-02 DIAGNOSIS — Z0000 Encounter for general adult medical examination without abnormal findings: Secondary | ICD-10-CM

## 2019-09-02 DIAGNOSIS — R972 Elevated prostate specific antigen [PSA]: Secondary | ICD-10-CM

## 2019-09-02 DIAGNOSIS — R7301 Impaired fasting glucose: Secondary | ICD-10-CM | POA: Diagnosis not present

## 2019-09-02 DIAGNOSIS — E291 Testicular hypofunction: Secondary | ICD-10-CM

## 2019-09-02 DIAGNOSIS — E782 Mixed hyperlipidemia: Secondary | ICD-10-CM | POA: Diagnosis not present

## 2019-09-02 MED ORDER — TESTOSTERONE CYPIONATE 100 MG/ML IM SOLN
100.0000 mg | Freq: Once | INTRAMUSCULAR | Status: AC
  Administered 2019-09-02: 16:00:00 100 mg via INTRAMUSCULAR

## 2019-09-02 MED ORDER — ZOSTER VAC RECOMB ADJUVANTED 50 MCG/0.5ML IM SUSR: 0.5000 mL | Freq: Once | INTRAMUSCULAR | 1 refills | Status: AC

## 2019-09-02 MED ORDER — TESTOSTERONE CYPIONATE 200 MG/ML IM SOLN: 100.0000 mg | Freq: Once | INTRAMUSCULAR | Status: DC

## 2019-09-02 NOTE — Patient Instructions (Signed)
You received the Prevnar vaccine today, along with  your testosterone injection      The ShingRx vaccine is now available in local pharmacies and is much more protective than the old one  Zostavax  (it is about 97%  Effective in preventing shingles). .   It is therefore ADVISED for all interested adults over 50 to prevent shingles so I have printed you a prescription for it.  (it requires a 2nd dose 2 to 6 months after the first one) .  It will cause you to have flu  like symptoms for 2 days If your pharmacy is not available to give it to you,  The [facility] will give it to you.  They are located on the ground floor of the [place] should not have it on the same day as your high dose flu vaccine    Health Maintenance, Male Adopting a healthy lifestyle and getting preventive care are important in promoting health and wellness. Ask your health care provider about:  The right schedule for you to have regular tests and exams.  Things you can do on your own to prevent diseases and keep yourself healthy. What should I know about diet, weight, and exercise? Eat a healthy diet   Eat a diet that includes plenty of vegetables, fruits, low-fat dairy products, and lean protein.  Do not eat a lot of foods that are high in solid fats, added sugars, or sodium. Maintain a healthy weight Body mass index (BMI) is a measurement that can be used to identify possible weight problems. It estimates body fat based on height and weight. Your health care provider can help determine your BMI and help you achieve or maintain a healthy weight. Get regular exercise Get regular exercise. This is one of the most important things you can do for your health. Most adults should:  Exercise for at least 150 minutes each week. The exercise should increase your heart rate and make you sweat (moderate-intensity exercise).  Do strengthening exercises at least twice a week. This is in  addition to the moderate-intensity exercise.  Spend less time sitting. Even light physical activity can be beneficial. Watch cholesterol and blood lipids Have your blood tested for lipids and cholesterol at 66 years of age, then have this test every 5 years. You may need to have your cholesterol levels checked more often if:  Your lipid or cholesterol levels are high.  You are older than 66 years of age.  You are at high risk for heart disease. What should I know about cancer screening? Many types of cancers can be detected early and may often be prevented. Depending on your health history and family history, you may need to have cancer screening at various ages. This may include screening for:  Colorectal cancer.  Prostate cancer.  Skin cancer.  Lung cancer. What should I know about heart disease, diabetes, and high blood pressure? Blood pressure and heart disease  High blood pressure causes heart disease and increases the risk of stroke. This is more likely to develop in people who have high blood pressure readings, are of African descent, or are overweight.  Talk with your health care provider about your target blood pressure readings.  Have your blood pressure checked: ? Every 3-5 years if you are 36-15 years of age. ? Every year if you are 61 years old or older.  If you are between the ages of 60 and 72 and are  a current or former smoker, ask your health care provider if you should have a one-time screening for abdominal aortic aneurysm (AAA). Diabetes Have regular diabetes screenings. This checks your fasting blood sugar level. Have the screening done:  Once every three years after age 87 if you are at a normal weight and have a low risk for diabetes.  More often and at a younger age if you are overweight or have a high risk for diabetes. What should I know about preventing infection? Hepatitis B If you have a higher risk for hepatitis B, you should be screened for  this virus. Talk with your health care provider to find out if you are at risk for hepatitis B infection. Hepatitis C Blood testing is recommended for:  Everyone born from [birth year] through 1963.  Anyone with known risk factors for hepatitis C. Sexually transmitted infections (STIs)  You should be screened each year for STIs, including gonorrhea and chlamydia, if: ? You are sexually active and are younger than 66 years of age. ? You are older than 66 years of age and your health care provider tells you that you are at risk for this type of infection. ? Your sexual activity has changed since you were last screened, and you are at increased risk for chlamydia or gonorrhea. Ask your health care provider if you are at risk.  Ask your health care provider about whether you are at high risk for HIV. Your health care provider may recommend a prescription medicine to help prevent HIV infection. If you choose to take medicine to prevent HIV, you should first get tested for HIV. You should then be tested every 3 months for as long as you are taking the medicine. Follow these instructions at home: Lifestyle  Do not use any products that contain nicotine or tobacco, such as cigarettes, e-cigarettes, and chewing tobacco. If you need help quitting, ask your health care provider.  Do not use street drugs.  Do not share needles.  Ask your health care provider for help if you need support or information about quitting drugs. Alcohol use  Do not drink alcohol if your health care provider tells you not to drink.  If you drink alcohol: ? Limit how much you have to 0-2 drinks a day. ? Be aware of how much alcohol is in your drink. In the [place] one drink equals one 12 oz bottle of beer (355 mL), one 5 oz glass of wine (148 mL), or one 1 oz glass of hard liquor (44 mL). General instructions  Schedule regular health, dental, and eye exams.  Stay current with your vaccines.  Tell your health care provider  if: ? You often feel depressed. ? You have ever been abused or do not feel safe at home. Summary  Adopting a healthy lifestyle and getting preventive care are important in promoting health and wellness.  Follow your health care provider's instructions about healthy diet, exercising, and getting tested or screened for diseases.  Follow your health care provider's instructions on monitoring your cholesterol and blood pressure. This information is not intended to replace advice given to you by your health care provider. Make sure you discuss any questions you have with your health care provider. Document Released: 05/15/2008 Document Revised: 11/10/2018 Document Reviewed: 11/10/2018 Elsevier Patient Education  2020 [employer].

## 2019-09-02 NOTE — Progress Notes (Signed)
Patient ID: Bobby Rich, male    DOB: [birth year]  Age: 68 y.o. MRN: [id number]  The patient is here for annual follow up  and management of other chronic and acute problems.   The risk factors are reflected in the social history.  The roster of all physicians providing medical care to patient - is listed in the Snapshot section of the chart.  Activities of daily living:  The patient is 100% independent in all ADLs: dressing, toileting, feeding as well as independent mobility  Home safety : The patient has smoke detectors in the home. They wear seatbelts.  There are no firearms at home. There is no violence in the home.   There is no risks for hepatitis, STDs or HIV. There is no   history of blood transfusion. They have no travel history to infectious disease endemic areas of the world.  The patient has seen their dentist in the last six month. They have seen their eye doctor in the last year. He admits to slight hearing difficulty with regard to his wife's soft spoken voice  and some television programs.  He does wear hearing aids but not wearing them today .  He does not   have excessive sun exposure. Discussed the need for sun protection: hats, long sleeves and use of sunscreen if there is significant sun exposure.   Diet: the importance of a healthy diet is discussed. He has recently canged his dit due to weight gain. .  The benefits of regular aerobic exercise were discussed. he walks 4 times per week at a pace of an 18 minute mile, 3 miles daily .Marland Kitchen  He has been intermittently fasting,  And eats  A meal sized Atkins bar for dinner .  He has been steadily losing a half pound daily .  Wearing CPAP ,  Sleeps entire night,  No nocturia .  He has chronic non radiating back pain s/p surgery 4 yrs ago , s/p insertion of a spinal cord stimulator  Sees [facility] once a week for manipulation which he states helps greatly     Worried slightly about his marriage.  Wife Bobby Rich has been reassessing life  choices including their marriage of nearly 45 years since her sister was diagnosed with pancreatic cancer. He is requesting the name of a  family Retail banker .  Depression screen: there are no signs or vegative symptoms of depression- irritability, change in appetite, anhedonia, sadness/tearfullness.  Cognitive assessment: the patient manages all their financial and personal affairs and is actively engaged. They could relate day,date,year and events; recalled 2/3 objects at 3 minutes; performed clock-face test normally.  The following portions of the patient's history were reviewed and updated as appropriate: allergies, current medications, past family history, past medical history,  past surgical history, past social history  and problem list.  Visual acuity was not assessed per patient preference since she has regular follow up with her ophthalmologist. Hearing and body mass index were assessed and reviewed.   During the course of the visit the patient was educated and counseled about appropriate screening and preventive services including : fall prevention , diabetes screening, nutrition counseling, colorectal cancer screening, and recommended immunizations.    CC: The primary encounter diagnosis was Mixed hyperlipidemia. Diagnoses of Vitamin D deficiency, Impaired fasting glucose, Hypogonadism in male, Need for 23-polyvalent pneumococcal polysaccharide vaccine, Does use hearing aid, Essential hypertension, Visit for preventive health examination, Prediabetes, Marital conflict, Elevated PSA, less than 10 ng/ml, and L-S radiculopathy were also  pertinent to this visit.  1) obesity: he has addressed with diet and exercise (see above)  2) OSA:  Diagnosed by sleep study. he is wearing her CPAP every night a minimum of 6 hours per night and notes improved daytime wakefulness and decreased fatigue  3) Hypogonadism:  He is receiving biweekly injections   4) Hypertension: patient checks blood pressure  twice weekly at home.  Readings have been for the most part < 140/80 at rest . Patient is following a reduce salt diet most days and is taking medications as prescribed  History  Bobby Rich has a past medical history of Cancer ([place]), Depression, GERD (gastroesophageal reflux disease), Hyperlipidemia, Hypertension, and Low testosterone.   He has a past surgical history that includes Spine surgery (2012); Hernia repair (Bilateral); Cosmetic surgery; and Lumbar laminectomy (08/2016).   His family history includes Early death (age of onset: 83) in his father; Heart disease (age of onset: 57) in his father; Heart disease (age of onset: 53) in his brother; Stroke in his mother.He reports that he has never smoked. He has never used smokeless tobacco. He reports that he does not drink alcohol or use drugs.  Outpatient Medications Prior to Visit  Medication Sig Dispense Refill  . aspirin 81 MG tablet Take 81 mg by mouth daily.     Marland Kitchen atorvastatin (LIPITOR) 20 MG tablet TAKE 1 TABLET DAILY 90 tablet 1  . Coenzyme Q10 (CO Q-10) 100 MG CAPS Take 1 capsule by mouth daily.    Marland Kitchen LACTOBACILLUS PO Take 1 capsule by mouth daily.    . meloxicam (MOBIC) 15 MG tablet Take 1 tablet (15 mg total) by mouth daily. 90 tablet 1  . Multiple Vitamin (MULTI-VITAMIN) tablet Take by mouth.    . Omega-3 Fatty Acids (FISH OIL) 1000 MG CAPS Take by mouth.    Marland Kitchen omeprazole (PRILOSEC) 40 MG capsule TAKE 1 CAPSULE DAILY 90 capsule 3  . sildenafil (VIAGRA) 100 MG tablet Take 1 tablet (100 mg total) by mouth daily as needed for erectile dysfunction. 18 tablet 3  . valsartan-hydrochlorothiazide (DIOVAN HCT) 80-12.5 MG tablet Take 1 tablet by mouth daily. 90 tablet 0  . clotrimazole-betamethasone (LOTRISONE) cream Apply 1 application topically 2 (two) times daily. 30 g 0   No facility-administered medications prior to visit.     Review of Systems   Patient denies headache, fevers, malaise, unintentional weight loss, skin rash, eye  pain, sinus congestion and sinus pain, sore throat, dysphagia,  hemoptysis , cough, dyspnea, wheezing, chest pain, palpitations, orthopnea, edema, abdominal pain, nausea, melena, diarrhea, constipation, flank pain, dysuria, hematuria, urinary  Frequency, nocturia, numbness, tingling, seizures,  Focal weakness, Loss of consciousness,  Tremor, insomnia, depression, anxiety, and suicidal ideation.      Objective:  BP 130/76 (BP Location: Left Arm, Patient Position: Sitting, Cuff Size: Large)   Pulse 79   Temp (!) 97.3 F (36.3 C) (Temporal)   Resp 15   Ht 5\' 11"  (1.803 m)   Wt 237 lb 12.8 oz (107.9 kg)   SpO2 97%   BMI 33.17 kg/m   Physical Exam  General appearance: alert, cooperative and appears stated age Ears: normal TM's and external ear canals both ears Throat: lips, mucosa, and tongue normal; teeth and gums normal Neck: no adenopathy, no carotid bruit, supple, symmetrical, trachea midline and thyroid not enlarged, symmetric, no tenderness/mass/nodules Back: symmetric, no curvature. ROM normal. No CVA tenderness. Lungs: clear to auscultation bilaterally Heart: regular rate and rhythm, S1, S2 normal, no murmur, click,  rub or gallop Abdomen: soft, non-tender; bowel sounds normal; no masses,  no organomegaly Pulses: 2+ and symmetric Skin: Skin color, texture, turgor normal. No rashes or lesions Lymph nodes: Cervical, supraclavicular, and axillary nodes normal.  Assessment & Plan:   Problem List Items Addressed This Visit      Unprioritized   Hypogonadism in male    Managed with biweekly parenteral testosterone .  He is due for a dose today   Lab Results  Component Value Date   TESTOSTERONE 386 02/02/2019         Hyperlipidemia - Primary    Currently managed with atorvastatin.   Calculated LDL has risen to 182 LFTs normal.   Lab Results  Component Value Date   CHOL 255 (H) 09/02/2019   HDL 44 09/02/2019   LDLCALC 182 (H) 09/02/2019   LDLDIRECT 102.0 07/09/2017    TRIG 150 (H) 09/02/2019   CHOLHDL 5.8 (H) 09/02/2019   Lab Results  Component Value Date   ALT 18 09/02/2019   AST 21 09/02/2019   ALKPHOS 73 02/02/2019   BILITOT 0.4 09/02/2019         Relevant Orders   Lipid panel (Completed)   Comprehensive metabolic panel (Completed)   Visit for preventive health examination    age appropriate education and counseling updated, referrals for preventative services and immunizations addressed, dietary and smoking counseling addressed, most recent labs reviewed.  I have personally reviewed and have noted:  1) the patient's medical and social history 2) The pt's use of alcohol, tobacco, and illicit drugs 3) The patient's current medications and supplements 4) Functional ability including ADL's, fall risk, home safety risk, hearing and visual impairment 5) Diet and physical activities 6) Evidence for depression or mood disorder 7) The patient's height, weight, and BMI have been recorded in the chart  I have made referrals, and provided counseling and education based on review of the above      HTN (hypertension)    Well controlled on current regimen of ARB/thiazide. Renal function stable, no changes today.  Lab Results  Component Value Date   CREATININE 0.98 09/02/2019   Lab Results  Component Value Date   NA 137 09/02/2019   K 4.3 09/02/2019   CL 102 09/02/2019   CO2 20 09/02/2019         L-S radiculopathy    Chronic and intermittent , managed with lumbar decompression, spinal cord manipulator,  Weekly chiropractic manipulation and NSAIDS/tylenol. No fevers or other symptoms suggestive of recurrent osteomyelitis .  \      Elevated PSA, less than 10 ng/ml    He is supplementing testosterone and is now followed by  Urology for surveillance of PSA      Does use hearing aid   Prediabetes    a1c suggests prediabetes or well controlled DM.  Fasting glucose of 197  Suggests new onset type 2 DM. Will confirm fasting state.       Marital conflict    Marriage counselling referral requested today .       Relevant Orders   Ambulatory referral to Psychology    Other Visit Diagnoses    Vitamin D deficiency       Relevant Orders   VITAMIN D 25 Hydroxy (Vit-D Deficiency, Fractures) (Completed)   Impaired fasting glucose       Relevant Orders   Hemoglobin A1c (Completed)   Need for 23-polyvalent pneumococcal polysaccharide vaccine       Relevant Orders   Pneumococcal conjugate  vaccine 13-valent IM (Completed)     A total of 40 minutes was spent with patient more than half of which was spent in counseling patient on the above mentioned issues , reviewing and explaining recent labs and imaging studies done, and coordination of care.  I have discontinued Bobby Rich "Dick Steinberg"'s clotrimazole-betamethasone. I am also having him start on Zoster Vaccine Adjuvanted. Additionally, I am having him maintain his aspirin, Co Q-10, LACTOBACILLUS PO, sildenafil, Fish Oil, atorvastatin, meloxicam, omeprazole, Multi-Vitamin, and valsartan-hydrochlorothiazide. We administered testosterone cypionate.  Meds ordered this encounter  Medications  . Zoster Vaccine Adjuvanted [facility]) injection    Sig: Inject 0.5 mLs into the muscle once for 1 dose.    Dispense:  1 each    Refill:  1  . DISCONTD: testosterone cypionate (DEPOTESTOSTERONE CYPIONATE) injection 100 mg  . testosterone cypionate (DEPOTESTOTERONE CYPIONATE) injection 100 mg    Medications Discontinued During This Encounter  Medication Reason  . clotrimazole-betamethasone (LOTRISONE) cream Patient has not taken in last 30 days  . testosterone cypionate (DEPOTESTOSTERONE CYPIONATE) injection 100 mg     Follow-up: No follow-ups on file.   Crecencio Mc, MD

## 2019-09-03 LAB — LIPID PANEL
Cholesterol: 255 mg/dL — ABNORMAL HIGH (ref <200)
HDL: 44 mg/dL (ref >=40)
LDL Cholesterol (Calc): 182 mg/dL (calc) — ABNORMAL HIGH
Non-HDL Cholesterol (Calc): 211 mg/dL (calc) — ABNORMAL HIGH (ref <130)
Total CHOL/HDL Ratio: 5.8 (calc) — ABNORMAL HIGH (ref <5.0)
Triglycerides: 150 mg/dL — ABNORMAL HIGH (ref <150)

## 2019-09-03 LAB — COMPREHENSIVE METABOLIC PANEL
AG Ratio: 1.5 (calc) (ref 1.0–2.5)
ALT: 18 U/L (ref 9–46)
AST: 21 U/L (ref 10–35)
Albumin: 4.4 g/dL (ref 3.6–5.1)
Alkaline phosphatase (APISO): 83 U/L (ref 35–144)
BUN: 13 mg/dL (ref 7–25)
CO2: 20 mmol/L (ref 20–32)
Calcium: 10.2 mg/dL (ref 8.6–10.3)
Chloride: 102 mmol/L (ref 98–110)
Creat: 0.98 mg/dL (ref 0.70–1.25)
Globulin: 2.9 g/dL (calc) (ref 1.9–3.7)
Glucose, Bld: 197 mg/dL — ABNORMAL HIGH (ref 65–99)
Potassium: 4.3 mmol/L (ref 3.5–5.3)
Sodium: 137 mmol/L (ref 135–146)
Total Bilirubin: 0.4 mg/dL (ref 0.2–1.2)
Total Protein: 7.3 g/dL (ref 6.1–8.1)

## 2019-09-03 LAB — HEMOGLOBIN A1C
Hgb A1c MFr Bld: 6.1 % of total Hgb — ABNORMAL HIGH (ref <5.7)
Mean Plasma Glucose: 128 (calc)
eAG (mmol/L): 7.1 (calc)

## 2019-09-03 LAB — VITAMIN D 25 HYDROXY (VIT D DEFICIENCY, FRACTURES): Vit D, 25-Hydroxy: 39 ng/mL (ref 30–100)

## 2019-09-04 DIAGNOSIS — R7303 Prediabetes: Secondary | ICD-10-CM | POA: Insufficient documentation

## 2019-09-04 DIAGNOSIS — Z974 Presence of external hearing-aid: Secondary | ICD-10-CM | POA: Insufficient documentation

## 2019-09-04 DIAGNOSIS — Z630 Problems in relationship with spouse or partner: Secondary | ICD-10-CM | POA: Insufficient documentation

## 2019-09-04 NOTE — Assessment & Plan Note (Signed)
Well controlled on current regimen of ARB/thiazide. Renal function stable, no changes today.  Lab Results  Component Value Date   CREATININE 0.98 09/02/2019   Lab Results  Component Value Date   NA 137 09/02/2019   K 4.3 09/02/2019   CL 102 09/02/2019   CO2 20 09/02/2019

## 2019-09-04 NOTE — Assessment & Plan Note (Signed)
He is supplementing testosterone and is now followed by  Urology for surveillance of PSA

## 2019-09-04 NOTE — Assessment & Plan Note (Signed)

## 2019-09-04 NOTE — Assessment & Plan Note (Addendum)
a1c suggests prediabetes or well controlled DM.  Fasting glucose of 197  Suggests new onset type 2 DM. Will confirm fasting state.

## 2019-09-04 NOTE — Assessment & Plan Note (Signed)
Chronic and intermittent , managed with lumbar decompression, spinal cord manipulator,  Weekly chiropractic manipulation and NSAIDS/tylenol. No fevers or other symptoms suggestive of recurrent osteomyelitis .  \

## 2019-09-04 NOTE — Assessment & Plan Note (Signed)
Marriage counselling referral requested today .

## 2019-09-04 NOTE — Assessment & Plan Note (Signed)
Currently managed with atorvastatin.   Calculated LDL has risen to 182 LFTs normal.   Lab Results  Component Value Date   CHOL 255 (H) 09/02/2019   HDL 44 09/02/2019   LDLCALC 182 (H) 09/02/2019   LDLDIRECT 102.0 07/09/2017   TRIG 150 (H) 09/02/2019   CHOLHDL 5.8 (H) 09/02/2019   Lab Results  Component Value Date   ALT 18 09/02/2019   AST 21 09/02/2019   ALKPHOS 73 02/02/2019   BILITOT 0.4 09/02/2019

## 2019-09-04 NOTE — Assessment & Plan Note (Signed)
Managed with biweekly parenteral testosterone .  He is due for a dose today   Lab Results  Component Value Date   TESTOSTERONE 386 02/02/2019

## 2019-09-07 DIAGNOSIS — H903 Sensorineural hearing loss, bilateral: Secondary | ICD-10-CM | POA: Diagnosis not present

## 2019-09-13 DIAGNOSIS — C44619 Basal cell carcinoma of skin of left upper limb, including shoulder: Secondary | ICD-10-CM | POA: Diagnosis not present

## 2019-09-13 DIAGNOSIS — D2261 Melanocytic nevi of right upper limb, including shoulder: Secondary | ICD-10-CM | POA: Diagnosis not present

## 2019-09-13 DIAGNOSIS — X32XXXA Exposure to sunlight, initial encounter: Secondary | ICD-10-CM | POA: Diagnosis not present

## 2019-09-13 DIAGNOSIS — L570 Actinic keratosis: Secondary | ICD-10-CM | POA: Diagnosis not present

## 2019-09-13 DIAGNOSIS — D225 Melanocytic nevi of trunk: Secondary | ICD-10-CM | POA: Diagnosis not present

## 2019-09-13 DIAGNOSIS — L820 Inflamed seborrheic keratosis: Secondary | ICD-10-CM | POA: Diagnosis not present

## 2019-09-13 DIAGNOSIS — M1712 Unilateral primary osteoarthritis, left knee: Secondary | ICD-10-CM | POA: Diagnosis not present

## 2019-09-13 DIAGNOSIS — D485 Neoplasm of uncertain behavior of skin: Secondary | ICD-10-CM | POA: Diagnosis not present

## 2019-09-13 DIAGNOSIS — D2262 Melanocytic nevi of left upper limb, including shoulder: Secondary | ICD-10-CM | POA: Diagnosis not present

## 2019-09-13 DIAGNOSIS — Z85828 Personal history of other malignant neoplasm of skin: Secondary | ICD-10-CM | POA: Diagnosis not present

## 2019-09-14 ENCOUNTER — Other Ambulatory Visit: Payer: Self-pay

## 2019-09-14 ENCOUNTER — Ambulatory Visit (INDEPENDENT_AMBULATORY_CARE_PROVIDER_SITE_OTHER): Payer: Medicare Other | Admitting: *Deleted

## 2019-09-14 DIAGNOSIS — R7989 Other specified abnormal findings of blood chemistry: Secondary | ICD-10-CM | POA: Diagnosis not present

## 2019-09-14 DIAGNOSIS — E291 Testicular hypofunction: Secondary | ICD-10-CM | POA: Diagnosis not present

## 2019-09-14 MED ORDER — TESTOSTERONE CYPIONATE 100 MG/ML IM SOLN
100.0000 mg | Freq: Once | INTRAMUSCULAR | Status: AC
  Administered 2019-09-14: 09:00:00 100 mg via INTRAMUSCULAR

## 2019-09-14 NOTE — Progress Notes (Signed)
Patient presented for testosterone injection for low testosterone, injection given in upper left quadrant, patient showed no signs of distress and voiced no concerns.

## 2019-09-15 ENCOUNTER — Ambulatory Visit (INDEPENDENT_AMBULATORY_CARE_PROVIDER_SITE_OTHER): Payer: Medicare Other

## 2019-09-15 DIAGNOSIS — Z0000 Encounter for general adult medical examination without abnormal findings: Secondary | ICD-10-CM | POA: Diagnosis not present

## 2019-09-15 NOTE — Patient Instructions (Signed)
  Bobby Rich , Thank you for taking time to come for your Medicare Wellness Visit. I appreciate your ongoing commitment to your health goals. Please review the following plan we discussed and let me know if I can assist you in the future.   These are the goals we discussed: Goals    . Follow up with Primary Care Provider     As needed       This is a list of the screening recommended for you and due dates:  Health Maintenance  Topic Date Due  . Colon Cancer Screening  01/29/2022  . Tetanus Vaccine  02/11/2023  . Flu Shot  Completed  .  Hepatitis C: One time screening is recommended by Center for Disease Control  (CDC) for  adults born from [birth year] through 1963.   Completed  . Pneumonia vaccines  Completed

## 2019-09-15 NOTE — Progress Notes (Addendum)
Subjective:   Bobby Rich is a 68 y.o. male who presents for an Initial Medicare Annual Wellness Visit.  Review of Systems  No ROS.  Medicare Wellness Virtual Visit.  Visual/audio telehealth visit, UTA vital signs.   See social history for additional risk factors.   Cardiac Risk Factors include: advanced age (>49men, >49 women);hypertension;male gender    Objective:    Today's Vitals   There is no height or weight on file to calculate BMI.  Advanced Directives 09/15/2019 07/22/2018  Does Patient Have a Medical Advance Directive? No Yes  Type of Advance Directive - [place];Living will  Does patient want to make changes to medical advance directive? No - Patient declined Yes (Inpatient - patient requests chaplain consult to change a medical advance directive)  Copy of [place] in Chart? - No - copy requested    Current Medications (verified) Outpatient Encounter Medications as of 09/15/2019  Medication Sig  . aspirin 81 MG tablet Take 81 mg by mouth daily.   Marland Kitchen atorvastatin (LIPITOR) 20 MG tablet TAKE 1 TABLET DAILY  . Coenzyme Q10 (CO Q-10) 100 MG CAPS Take 1 capsule by mouth daily.  Marland Kitchen LACTOBACILLUS PO Take 1 capsule by mouth daily.  . meloxicam (MOBIC) 15 MG tablet Take 1 tablet (15 mg total) by mouth daily.  . Multiple Vitamin (MULTI-VITAMIN) tablet Take by mouth.  . Omega-3 Fatty Acids (FISH OIL) 1000 MG CAPS Take by mouth.  Marland Kitchen omeprazole (PRILOSEC) 40 MG capsule TAKE 1 CAPSULE DAILY  . sildenafil (VIAGRA) 100 MG tablet Take 1 tablet (100 mg total) by mouth daily as needed for erectile dysfunction.  . valsartan-hydrochlorothiazide (DIOVAN HCT) 80-12.5 MG tablet Take 1 tablet by mouth daily.   No facility-administered encounter medications on file as of 09/15/2019.     Allergies (verified) Patient has no known allergies.   History: Past Medical History:  Diagnosis Date  . Cancer ([place])    basel cell skin cancer  .  Depression   . GERD (gastroesophageal reflux disease)   . Hyperlipidemia   . Hypertension   . Low testosterone    Past Surgical History:  Procedure Laterality Date  . COSMETIC SURGERY    . HERNIA REPAIR Bilateral    with mesh   . LUMBAR LAMINECTOMY  08/2016  . SPINE SURGERY  2012   [place] L3 4 5    Family History  Problem Relation Age of Onset  . Stroke Mother   . Early death Father 39  . Heart disease Father 47       AMI   . Heart disease Brother 43       AMI   Social History   Socioeconomic History  . Marital status: Single    Spouse name: Not on file  . Number of children: Not on file  . Years of education: Not on file  . Highest education level: Not on file  Occupational History  . Not on file  Social Needs  . Financial resource strain: Not hard at all  . Food insecurity    Worry: Never true    Inability: Never true  . Transportation needs    Medical: No    Non-medical: No  Tobacco Use  . Smoking status: Never Smoker  . Smokeless tobacco: Never Used  Substance and Sexual Activity  . Alcohol use: Yes    Comment: social  . Drug use: No  . Sexual activity: Yes  Lifestyle  . Physical activity  Days per week: 7 days    Minutes per session: 30 min  . Stress: Not at all  Relationships  . Social Herbalist on phone: Not on file    Gets together: Not on file    Attends religious service: Not on file    Active member of club or organization: Not on file    Attends meetings of clubs or organizations: Not on file    Relationship status: Not on file  Other Topics Concern  . Not on file  Social History Narrative  . Not on file   Tobacco Counseling Counseling given: Not Answered   Clinical Intake:  Pre-visit preparation completed: Yes        Diabetes: No  How often do you need to have someone help you when you read instructions, pamphlets, or other written materials from your doctor or pharmacy?: 1 - Never  Interpreter Needed?: No      Activities of Daily Living In your present state of health, do you have any difficulty performing the following activities: 09/15/2019  Hearing? N  Vision? N  Difficulty concentrating or making decisions? N  Walking or climbing stairs? Y  Comment Chronic knee pain, L knee. First cortisone injection recieved.  Dressing or bathing? N  Doing errands, shopping? N  Preparing Food and eating ? N  Using the Toilet? N  In the past six months, have you accidently leaked urine? N  Do you have problems with loss of bowel control? N  Managing your Medications? N  Managing your Finances? N  Housekeeping or managing your Housekeeping? N  Some recent data might be hidden     Immunizations and Health Maintenance Immunization History  Administered Date(s) Administered  . Fluad Quad(high Dose 65+) 08/02/2019  . Influenza Split 02/10/2013  . Influenza, High Dose Seasonal PF 08/18/2018  . Influenza,inj,Quad PF,6+ Mos 10/07/2013, 11/01/2014, 11/20/2015, 07/22/2016  . Pneumococcal Conjugate-13 09/02/2019  . Pneumococcal Polysaccharide-23 08/26/2018  . Tdap 02/10/2013  . Zoster 06/11/2014   There are no preventive care reminders to display for this patient.  Patient Care Team: Crecencio Mc, MD as PCP - General (Internal Medicine)  Indicate any recent Medical Services you may have received from other than [facility] providers in the past year (date may be approximate).    Assessment:   This is a routine wellness examination for Bobby Rich.  I connected with patient 09/15/19 at 11:00 AM EDT by an audio enabled telemedicine application and verified that I am speaking with the correct person using two identifiers. Patient stated full name and DOB. Patient gave permission to continue with virtual visit. Patient's location was at home and Nurse's location was at [place] office.   Health Maintenance Due: See completed HM at the end of note.   Eye: Visual acuity not assessed. Virtual visit. Wears  corrective lenses. Followed by their ophthalmologist every 12 months.   Dental: Visits every 4 months.    Hearing: Demonstrates normal hearing during visit. Hearing aids- yes  Safety:  Patient feels safe at home- yes Patient does have smoke detectors at home- yes Patient does wear sunscreen or protective clothing when in direct sunlight - yes Patient does wear seat belt when in a moving vehicle - yes Patient drives- yes Adequate lighting in walkways free from debris- yes Grab bars and handrails used as appropriate- yes Ambulates with no assistive device Cell phone on person when ambulating outside of the home- yes  Social: Alcohol intake - yes, social Smoking  history- never   Smokers in home? none Illicit drug use? none  Depression: PHQ 2 &9 complete. See screening below. Denies irritability, anhedonia, sadness/tearfullness.    Falls: See screening below.    Medication: Taking as directed and without issues.   Covid-19: Precautions and sickness symptoms discussed. Wears mask, social distancing, hand hygiene as appropriate.   Activities of Daily Living Patient denies needing assistance with: household chores, feeding themselves, getting from bed to chair, getting to the toilet, bathing/showering, dressing, managing money, or preparing meals.   Memory: Patient is alert. Patient denies difficulty focusing or concentrating. Correctly identified the president of the [place], season and recall. Patient  Works full time which assists with brain stimulation.  BMI- discussed the importance of a healthy diet, water intake and the benefits of aerobic exercise.  Educational material provided.  Physical activity- walking daily 1-3 miles  Diet: Regular Water: good intake  Other Providers Patient Care Team: Crecencio Mc, MD as PCP - General (Internal Medicine)  Hearing/Vision screen  Hearing Screening   125Hz  250Hz  500Hz  1000Hz  2000Hz  3000Hz  4000Hz  6000Hz  8000Hz   Right  ear:           Left ear:           Comments: Patient is able to hear conversational tones without difficulty.  No issues reported.   Vision Screening Comments: Visual acuity not assessed, virtual visit.  They have seen their ophthalmologist in the last 12 months.     Dietary issues and exercise activities discussed: Current Exercise Habits: Home exercise routine, Type of exercise: walking, Time (Minutes): 30, Frequency (Times/Week): 7, Weekly Exercise (Minutes/Week): 210, Intensity: Moderate  Goals    . Follow up with Primary Care Provider     As needed      Depression Screen PHQ 2/9 Scores 09/15/2019 08/26/2018 10/15/2016  PHQ - 2 Score 0 0 0  PHQ- 9 Score - 1 -    Fall Risk Fall Risk  09/15/2019 09/02/2019 08/26/2018 10/15/2016  Falls in the past year? 0 0 No No  Follow up - Falls evaluation completed - -   Timed Get Up and Go performed: no, virtual visit  Cognitive Function:     6CIT Screen 09/15/2019  What Year? 0 points  What month? 0 points  What time? 0 points  Count back from 20 0 points  Months in reverse 0 points  Repeat phrase 0 points  Total Score 0    Screening Tests Health Maintenance  Topic Date Due  . COLONOSCOPY  01/29/2022  . TETANUS/TDAP  02/11/2023  . INFLUENZA VACCINE  Completed  . Hepatitis C Screening  Completed  . PNA vac Low Risk Adult  Completed      Plan:    Keep all routine maintenance appointments.   Medicare Attestation I have personally reviewed: The patient's medical and social history Their use of alcohol, tobacco or illicit drugs Their current medications and supplements The patient's functional ability including ADLs,fall risks, home safety risks, cognitive, and hearing and visual impairment Diet and physical activities Evidence for depression   In addition, I have reviewed and discussed with patient certain preventive protocols, quality metrics, and best practice recommendations. A written personalized care plan for  preventive services as well as general preventive health recommendations were provided to patient via mail.     OBrien-Blaney, Esaul Dorwart L, LPN   [id number]       I have reviewed the above information and agree with above.   Deborra Medina, MD

## 2019-09-21 ENCOUNTER — Telehealth: Payer: Self-pay

## 2019-09-21 DIAGNOSIS — R972 Elevated prostate specific antigen [PSA]: Secondary | ICD-10-CM

## 2019-09-21 NOTE — Telephone Encounter (Signed)
I called & LM asking patient to call back in regards to [employer]. I was trying to see if patient was willing to do a virtual with dr. Derrel Nip on Friday.

## 2019-09-23 ENCOUNTER — Ambulatory Visit: Payer: Medicare Other | Admitting: Internal Medicine

## 2019-09-28 ENCOUNTER — Ambulatory Visit (INDEPENDENT_AMBULATORY_CARE_PROVIDER_SITE_OTHER): Payer: Medicare Other | Admitting: *Deleted

## 2019-09-28 ENCOUNTER — Other Ambulatory Visit: Payer: Self-pay

## 2019-09-28 DIAGNOSIS — E291 Testicular hypofunction: Secondary | ICD-10-CM

## 2019-09-28 MED ORDER — TESTOSTERONE CYPIONATE 100 MG/ML IM SOLN
100.0000 mg | INTRAMUSCULAR | Status: DC
  Administered 2019-09-28: 100 mg via INTRAMUSCULAR

## 2019-09-28 NOTE — Assessment & Plan Note (Signed)
Next appt Oct 25 2019

## 2019-09-28 NOTE — Progress Notes (Addendum)
Patient presented for testosterone injection to RUQ patient voiced no concerns and showed no signs of discomfort.  Reviewed.   Will order follow up psa. Dr Nicki Reaper

## 2019-10-12 ENCOUNTER — Ambulatory Visit (INDEPENDENT_AMBULATORY_CARE_PROVIDER_SITE_OTHER): Payer: Medicare Other | Admitting: *Deleted

## 2019-10-12 ENCOUNTER — Other Ambulatory Visit: Payer: Self-pay | Admitting: Internal Medicine

## 2019-10-12 ENCOUNTER — Other Ambulatory Visit: Payer: Self-pay

## 2019-10-12 DIAGNOSIS — E291 Testicular hypofunction: Secondary | ICD-10-CM | POA: Diagnosis not present

## 2019-10-12 MED ORDER — TESTOSTERONE CYPIONATE 100 MG/ML IM SOLN
100.0000 mg | Freq: Once | INTRAMUSCULAR | Status: AC
  Administered 2019-10-12: 100 mg via INTRAMUSCULAR

## 2019-10-12 NOTE — Progress Notes (Signed)
Patient presented for testosterone injection to LUQ patient tolerated well.

## 2019-10-20 DIAGNOSIS — M1712 Unilateral primary osteoarthritis, left knee: Secondary | ICD-10-CM | POA: Diagnosis not present

## 2019-10-25 ENCOUNTER — Other Ambulatory Visit: Payer: Self-pay

## 2019-10-25 DIAGNOSIS — N5203 Combined arterial insufficiency and corporo-venous occlusive erectile dysfunction: Secondary | ICD-10-CM | POA: Diagnosis not present

## 2019-10-25 DIAGNOSIS — E291 Testicular hypofunction: Secondary | ICD-10-CM | POA: Diagnosis not present

## 2019-10-25 DIAGNOSIS — Z79899 Other long term (current) drug therapy: Secondary | ICD-10-CM | POA: Diagnosis not present

## 2019-10-25 DIAGNOSIS — R972 Elevated prostate specific antigen [PSA]: Secondary | ICD-10-CM | POA: Diagnosis not present

## 2019-10-25 DIAGNOSIS — Z125 Encounter for screening for malignant neoplasm of prostate: Secondary | ICD-10-CM | POA: Diagnosis not present

## 2019-10-25 DIAGNOSIS — N401 Enlarged prostate with lower urinary tract symptoms: Secondary | ICD-10-CM | POA: Diagnosis not present

## 2019-10-25 DIAGNOSIS — C44629 Squamous cell carcinoma of skin of left upper limb, including shoulder: Secondary | ICD-10-CM | POA: Diagnosis not present

## 2019-10-25 MED ORDER — VALSARTAN-HYDROCHLOROTHIAZIDE 80-12.5 MG PO TABS: 1.0000 | ORAL_TABLET | Freq: Every day | ORAL | 1 refills | Status: DC

## 2019-10-26 ENCOUNTER — Ambulatory Visit (INDEPENDENT_AMBULATORY_CARE_PROVIDER_SITE_OTHER): Payer: Medicare Other | Admitting: *Deleted

## 2019-10-26 ENCOUNTER — Other Ambulatory Visit: Payer: Self-pay

## 2019-10-26 DIAGNOSIS — E291 Testicular hypofunction: Secondary | ICD-10-CM | POA: Diagnosis not present

## 2019-10-26 MED ORDER — TESTOSTERONE CYPIONATE 100 MG/ML IM SOLN
100.0000 mg | Freq: Once | INTRAMUSCULAR | Status: AC
  Administered 2019-10-26: 100 mg via INTRAMUSCULAR

## 2019-10-26 NOTE — Progress Notes (Addendum)
Patient in for testosterone injectio into RUQ patient tolerated well.

## 2019-11-08 DIAGNOSIS — M1712 Unilateral primary osteoarthritis, left knee: Secondary | ICD-10-CM | POA: Diagnosis not present

## 2019-11-09 ENCOUNTER — Ambulatory Visit: Payer: Medicare Other

## 2019-11-21 ENCOUNTER — Other Ambulatory Visit: Payer: Self-pay

## 2019-11-23 ENCOUNTER — Ambulatory Visit: Payer: Medicare Other

## 2019-11-23 ENCOUNTER — Other Ambulatory Visit: Payer: Self-pay

## 2019-11-23 DIAGNOSIS — E291 Testicular hypofunction: Secondary | ICD-10-CM

## 2019-11-23 NOTE — Progress Notes (Addendum)
Patient came in for NV to get testosterone injection. Given in LUQ. Tolerated well. No complaints or concerns at this time.      I have reviewed the above information and agree with above.   Deborra Medina, MD

## 2019-12-08 ENCOUNTER — Ambulatory Visit (INDEPENDENT_AMBULATORY_CARE_PROVIDER_SITE_OTHER): Payer: Medicare Other

## 2019-12-08 ENCOUNTER — Other Ambulatory Visit: Payer: Self-pay

## 2019-12-08 VITALS — Temp 95.8°F

## 2019-12-08 DIAGNOSIS — E291 Testicular hypofunction: Secondary | ICD-10-CM

## 2019-12-08 MED ORDER — TESTOSTERONE CYPIONATE 200 MG/ML IM SOLN
100.0000 mg | INTRAMUSCULAR | Status: DC
  Administered 2019-12-08: 100 mg via INTRAMUSCULAR

## 2019-12-08 NOTE — Progress Notes (Signed)
Patient came in today for testosterone injection IM in right upper glut. Patient tolerated well.

## 2019-12-21 ENCOUNTER — Other Ambulatory Visit: Payer: Self-pay

## 2019-12-21 ENCOUNTER — Ambulatory Visit: Payer: Medicare Other

## 2019-12-21 DIAGNOSIS — E291 Testicular hypofunction: Secondary | ICD-10-CM

## 2019-12-21 NOTE — Progress Notes (Addendum)
Patient presented today for testosterone injection. Administered 1 mL. Given in RUQ. Tolerated well. No complaints or concerns at this time.    I have reviewed the above information and agree with above.   Deborra Medina, MD

## 2019-12-23 ENCOUNTER — Other Ambulatory Visit: Payer: Self-pay

## 2019-12-23 ENCOUNTER — Ambulatory Visit (INDEPENDENT_AMBULATORY_CARE_PROVIDER_SITE_OTHER): Payer: Medicare Other

## 2019-12-23 ENCOUNTER — Ambulatory Visit (INDEPENDENT_AMBULATORY_CARE_PROVIDER_SITE_OTHER): Payer: Medicare Other | Admitting: Podiatry

## 2019-12-23 DIAGNOSIS — L603 Nail dystrophy: Secondary | ICD-10-CM | POA: Diagnosis not present

## 2019-12-23 DIAGNOSIS — L989 Disorder of the skin and subcutaneous tissue, unspecified: Secondary | ICD-10-CM

## 2019-12-25 NOTE — Progress Notes (Signed)
   Subjective: 69 y.o. male presenting to the office today presenting today with a chief complaint of intermittent soreness to the lateral right foot that began a few months ago. Walking increases the pain. She is concerned that scar tissue is causing the pain.  She also notes constant soreness to the left 2nd digit that began about 3 months ago. She reports associated bruising. She denies modifying factors of this complaint. She has not done anything for treatment. Patient is here for further evaluation and treatment.    Past Medical History:  Diagnosis Date  . Cancer ([place])    basel cell skin cancer  . Depression   . GERD (gastroesophageal reflux disease)   . Hyperlipidemia   . Hypertension   . Low testosterone      Objective:  Physical Exam General: Alert and oriented x3 in no acute distress  Dermatology: Hyperkeratotic lesion(s) present on the right lateral forefoot. Pain on palpation with a central nucleated core noted. Skin is warm, dry and supple bilateral lower extremities. Negative for open lesions or macerations.  Vascular: Palpable pedal pulses bilaterally. No edema or erythema noted. Capillary refill within normal limits.  Neurological: Epicritic and protective threshold grossly intact bilaterally.   Musculoskeletal Exam: Pain on palpation at the keratotic lesion(s) noted. Morton's toe/long 2nd toe noted to the left foot. Range of motion within normal limits bilateral. Muscle strength 5/5 in all groups bilateral.  Radiographic Exam:  Normal osseous mineralization. Joint spaces preserved. No fracture/dislocation/boney destruction.    Assessment: 1. Pre-ulcerative callus lesion noted to the lateral right forefoot  2. 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 Patient evaluated. X-Rays reviewed.  2. Excisional debridement of keratoic lesion(s) using a chisel blade was performed without incident.  3. Dressed area with light dressing. 4. Silicone toe cap  provided.  5. Patient is to return to the clinic PRN.   Edrick Kins, DPM [facility]  Dr. Edrick Kins, [address]                                        [place], [place] [address]                Office [phone]  Fax [phone]

## 2020-01-04 ENCOUNTER — Other Ambulatory Visit: Payer: Self-pay

## 2020-01-04 ENCOUNTER — Ambulatory Visit (INDEPENDENT_AMBULATORY_CARE_PROVIDER_SITE_OTHER): Payer: Medicare Other | Admitting: *Deleted

## 2020-01-04 DIAGNOSIS — E291 Testicular hypofunction: Secondary | ICD-10-CM | POA: Diagnosis not present

## 2020-01-04 MED ORDER — TESTOSTERONE CYPIONATE 100 MG/ML IM SOLN
100.0000 mg | Freq: Once | INTRAMUSCULAR | Status: AC
  Administered 2020-01-04: 15:00:00 100 mg via INTRAMUSCULAR

## 2020-01-04 NOTE — Progress Notes (Signed)
Patient in for testosterone injection to RUQ, patient tolerated well and voiced no concerns during or after injection.

## 2020-01-14 DIAGNOSIS — Z23 Encounter for immunization: Secondary | ICD-10-CM | POA: Diagnosis not present

## 2020-01-18 ENCOUNTER — Ambulatory Visit (INDEPENDENT_AMBULATORY_CARE_PROVIDER_SITE_OTHER): Payer: Medicare Other

## 2020-01-18 ENCOUNTER — Other Ambulatory Visit: Payer: Self-pay

## 2020-01-18 DIAGNOSIS — R7989 Other specified abnormal findings of blood chemistry: Secondary | ICD-10-CM | POA: Diagnosis not present

## 2020-01-18 NOTE — Progress Notes (Addendum)
Patient presented for Testosterone injection to left glute, patient voiced no concerns nor showed any signs of distress during injection.   I have reviewed the above information and agree with above.   Deborra Medina, MD

## 2020-01-24 ENCOUNTER — Encounter: Payer: Self-pay | Admitting: Family Medicine

## 2020-01-24 ENCOUNTER — Ambulatory Visit: Payer: Medicare Other | Attending: Internal Medicine

## 2020-01-24 ENCOUNTER — Telehealth: Payer: Self-pay | Admitting: Internal Medicine

## 2020-01-24 ENCOUNTER — Other Ambulatory Visit: Payer: Self-pay

## 2020-01-24 ENCOUNTER — Ambulatory Visit (INDEPENDENT_AMBULATORY_CARE_PROVIDER_SITE_OTHER): Payer: Medicare Other | Admitting: Family Medicine

## 2020-01-24 VITALS — Ht 71.0 in | Wt 220.0 lb

## 2020-01-24 DIAGNOSIS — Z20822 Contact with and (suspected) exposure to covid-19: Secondary | ICD-10-CM | POA: Diagnosis not present

## 2020-01-24 DIAGNOSIS — G933 Postviral fatigue syndrome: Secondary | ICD-10-CM | POA: Insufficient documentation

## 2020-01-24 DIAGNOSIS — E291 Testicular hypofunction: Secondary | ICD-10-CM | POA: Diagnosis not present

## 2020-01-24 DIAGNOSIS — R5383 Other fatigue: Secondary | ICD-10-CM | POA: Diagnosis not present

## 2020-01-24 DIAGNOSIS — G9331 Postviral fatigue syndrome: Secondary | ICD-10-CM | POA: Insufficient documentation

## 2020-01-24 NOTE — Progress Notes (Signed)
Virtual Visit via video Note  This visit type was conducted due to national recommendations for restrictions regarding the COVID-19 pandemic (e.g. social distancing).  This format is felt to be most appropriate for this patient at this time.  All issues noted in this document were discussed and addressed.  No physical exam was performed (except for noted visual exam findings with Video Visits).   I connected with Bobby Rich today at 12:30 PM EST by a video enabled telemedicine application or telephone and verified that I am speaking with the correct person using two identifiers. Location patient: home Location provider: work Persons participating in the virtual visit: patient, provider  I discussed the limitations, risks, security and privacy concerns of performing an evaluation and management service by telephone and the availability of in person appointments. I also discussed with the patient that there may be a patient responsible charge related to this service. The patient expressed understanding and agreed to proceed.  Reason for visit: Same day visit.  HPI: Fatigue: Patient notes onset of fatigue on 2/14.  He received his Covid vaccine on 2/13.  He notes he developed some tiredness today after and he has been unusually tired since then with severe fatigue.  He did note some chills today.  He denies cough, fever, myalgias, headache, shortness of breath, sore throat, congestion, diarrhea, nausea, vomiting, taste or smell disturbance, and chest pain.  He notes no COVID-19 exposures.   ROS: See pertinent positives and negatives per HPI.  Past Medical History:  Diagnosis Date  . Cancer ([place])    basel cell skin cancer  . Depression   . GERD (gastroesophageal reflux disease)   . Hyperlipidemia   . Hypertension   . Low testosterone     Past Surgical History:  Procedure Laterality Date  . COSMETIC SURGERY    . HERNIA REPAIR Bilateral    with mesh   . LUMBAR LAMINECTOMY   08/2016  . SPINE SURGERY  2012   [place] L3 4 5     Family History  Problem Relation Age of Onset  . Stroke Mother   . Early death Father 45  . Heart disease Father 4       AMI   . Heart disease Brother 32       AMI    SOCIAL HX: Non-smoker   Current Outpatient Medications:  .  aspirin 81 MG tablet, Take 81 mg by mouth daily. , Disp: , Rfl:  .  atorvastatin (LIPITOR) 20 MG tablet, TAKE 1 TABLET DAILY, Disp: 90 tablet, Rfl: 3 .  Coenzyme Q10 (CO Q-10) 100 MG CAPS, Take 1 capsule by mouth daily., Disp: , Rfl:  .  LACTOBACILLUS PO, Take 1 capsule by mouth daily., Disp: , Rfl:  .  meloxicam (MOBIC) 15 MG tablet, TAKE 1 TABLET DAILY, Disp: 90 tablet, Rfl: 3 .  Multiple Vitamin (MULTI-VITAMIN) tablet, Take by mouth., Disp: , Rfl:  .  Omega-3 Fatty Acids (FISH OIL) 1000 MG CAPS, Take by mouth., Disp: , Rfl:  .  omeprazole (PRILOSEC) 40 MG capsule, TAKE 1 CAPSULE DAILY, Disp: 90 capsule, Rfl: 3 .  sildenafil (VIAGRA) 100 MG tablet, Take 1 tablet (100 mg total) by mouth daily as needed for erectile dysfunction., Disp: 18 tablet, Rfl: 3 .  valsartan-hydrochlorothiazide (DIOVAN HCT) 80-12.5 MG tablet, Take 1 tablet by mouth daily., Disp: 90 tablet, Rfl: 1  Current Facility-Administered Medications:  .  testosterone cypionate (DEPOTESTOSTERONE CYPIONATE) injection 100 mg, 100 mg, Intramuscular, Q14 Days, Deborra Medina  L, MD, 100 mg at 12/08/19 1019 .  testosterone cypionate (DEPOTESTOTERONE CYPIONATE) injection 100 mg, 100 mg, Intramuscular, Q14 Days, Crecencio Mc, MD, 100 mg at 09/28/19 1228  EXAM:  VITALS per patient if applicable:  GENERAL: alert, oriented, appears well and in no acute distress  HEENT: atraumatic, conjunttiva clear, no obvious abnormalities on inspection of external nose and ears  NECK: normal movements of the head and neck  LUNGS: on inspection no signs of respiratory distress, breathing rate appears normal, no obvious gross SOB, gasping or wheezing  CV: no  obvious cyanosis  MS: moves all visible extremities without noticeable abnormality  PSYCH/NEURO: pleasant and cooperative, no obvious depression or anxiety, speech and thought processing grossly intact  ASSESSMENT AND PLAN:  Discussed the following assessment and plan:  Fatigue New onset fatigue.  This did occur after his Covid vaccine and if it had lasted for a few days I would feel comfortable contributing his fatigue to that though given the persistence I think it would be less likely to be related to the Covid vaccine.  We will have him tested for COVID-19 and if negative I think we can rule that out as a possibility given lack of other symptoms.  I will place orders for lab work to be completed as well though those will be completed after his COVID-19 result returns.  Discussed that he should stay quarantined at home until we have his COVID-19 result.  Advised to seek medical attention for chest pain or shortness of breath.  Discussed that if he has any worsening symptoms he should let us know.   Orders Placed This Encounter  Procedures  . Novel Coronavirus, NAA ([facility])    Order Specific Question:   Is this test for diagnosis or screening    Answer:   Diagnosis of ill patient    Order Specific Question:   Symptomatic for COVID-19 as defined by CDC    Answer:   Yes    Order Specific Question:   Date of Symptom Onset    Answer:   01/15/2020    Order Specific Question:   Hospitalized for COVID-19    Answer:   No    Order Specific Question:   Admitted to ICU for COVID-19    Answer:   No    Order Specific Question:   Previously tested for COVID-19    Answer:   No    Order Specific Question:   Resident in a congregate (group) care setting    Answer:   No    Order Specific Question:   Is the patient student?    Answer:   No    Order Specific Question:   Employed in healthcare setting    Answer:   No  . Comp Met (CMET)    Standing Status:   Future    Standing Expiration Date:    01/23/2021  . TSH    Standing Status:   Future    Standing Expiration Date:   01/23/2021  . CBC    Standing Status:   Future    Standing Expiration Date:   01/23/2021  . Testosterone    Standing Status:   Future    Standing Expiration Date:   01/23/2021    No orders of the defined types were placed in this encounter.    I discussed the assessment and treatment plan with the patient. The patient was provided an opportunity to ask questions and all were answered. The patient agreed with the  plan and demonstrated an understanding of the instructions.   The patient was advised to call back or seek an in-person evaluation if the symptoms worsen or if the condition fails to improve as anticipated.   Tommi Rumps, MD

## 2020-01-24 NOTE — Telephone Encounter (Signed)
Virtual visit completed.

## 2020-01-24 NOTE — Telephone Encounter (Signed)
Scheduled Doxy with Dr. Caryl Bis to discuss severe fatigue issues and COVID vaccine today at 12:30.Patient had first COVID vaccine one week ago now experiencing severe fatigue no other symptoms noted. Patient says he just feels extremely tired no energy.

## 2020-01-24 NOTE — Telephone Encounter (Signed)
PT Called wanting an in office appt he is having sever fatigue from the Covid vaccine last week  sent call to [facility]

## 2020-01-24 NOTE — Assessment & Plan Note (Signed)
New onset fatigue.  This did occur after his Covid vaccine and if it had lasted for a few days I would feel comfortable contributing his fatigue to that though given the persistence I think it would be less likely to be related to the Covid vaccine.  We will have him tested for COVID-19 and if negative I think we can rule that out as a possibility given lack of other symptoms.  I will place orders for lab work to be completed as well though those will be completed after his COVID-19 result returns.  Discussed that he should stay quarantined at home until we have his COVID-19 result.  Advised to seek medical attention for chest pain or shortness of breath.  Discussed that if he has any worsening symptoms he should let us know.

## 2020-01-25 ENCOUNTER — Other Ambulatory Visit: Payer: Self-pay | Admitting: Family Medicine

## 2020-01-25 DIAGNOSIS — R5383 Other fatigue: Secondary | ICD-10-CM

## 2020-01-25 LAB — NOVEL CORONAVIRUS, NAA: SARS-CoV-2, NAA: NOT DETECTED

## 2020-01-26 ENCOUNTER — Other Ambulatory Visit
Admission: RE | Admit: 2020-01-26 | Discharge: 2020-01-26 | Disposition: A | Discharge status: Home / Self Care | Payer: Medicare Other | Attending: Family Medicine | Admitting: Family Medicine

## 2020-01-26 ENCOUNTER — Other Ambulatory Visit: Payer: Self-pay

## 2020-01-26 DIAGNOSIS — R5383 Other fatigue: Secondary | ICD-10-CM | POA: Insufficient documentation

## 2020-01-26 LAB — COMPREHENSIVE METABOLIC PANEL
ALT: 27 U/L (ref 0–44)
AST: 22 U/L (ref 15–41)
Albumin: 4.3 g/dL (ref 3.5–5.0)
Alkaline Phosphatase: 73 U/L (ref 38–126)
Anion gap: 10 (ref 5–15)
BUN: 20 mg/dL (ref 8–23)
CO2: 28 mmol/L (ref 22–32)
Calcium: 9.1 mg/dL (ref 8.9–10.3)
Chloride: 105 mmol/L (ref 98–111)
Creatinine, Ser: 1.2 mg/dL (ref 0.61–1.24)
GFR calc Af Amer: >60 mL/min (ref >60)
GFR calc non Af Amer: >60 mL/min (ref >60)
Glucose, Bld: 98 mg/dL (ref 70–99)
Potassium: 4.4 mmol/L (ref 3.5–5.1)
Sodium: 143 mmol/L (ref 135–145)
Total Bilirubin: 0.8 mg/dL (ref 0.3–1.2)
Total Protein: 7.2 g/dL (ref 6.5–8.1)

## 2020-01-26 LAB — CBC WITH DIFFERENTIAL/PLATELET
Abs Immature Granulocytes: 0.02 10*3/uL (ref 0.00–0.07)
Basophils Absolute: 0 10*3/uL (ref 0.0–0.1)
Basophils Relative: 1 %
Eosinophils Absolute: 0.1 10*3/uL (ref 0.0–0.5)
Eosinophils Relative: 2 %
HCT: 48.1 % (ref 39.0–52.0)
Hemoglobin: 15.3 g/dL (ref 13.0–17.0)
Immature Granulocytes: 0 %
Lymphocytes Relative: 30 %
Lymphs Abs: 1.8 10*3/uL (ref 0.7–4.0)
MCH: 28.5 pg (ref 26.0–34.0)
MCHC: 31.8 g/dL (ref 30.0–36.0)
MCV: 89.7 fL (ref 80.0–100.0)
Monocytes Absolute: 0.4 10*3/uL (ref 0.1–1.0)
Monocytes Relative: 6 %
Neutro Abs: 3.7 10*3/uL (ref 1.7–7.7)
Neutrophils Relative %: 61 %
Platelets: 172 10*3/uL (ref 150–400)
RBC: 5.36 MIL/uL (ref 4.22–5.81)
RDW: 13.2 % (ref 11.5–15.5)
WBC: 6.1 10*3/uL (ref 4.0–10.5)
nRBC: 0 % (ref 0.0–0.2)

## 2020-01-26 LAB — TSH: TSH: 1.414 u[IU]/mL (ref 0.350–4.500)

## 2020-01-27 LAB — TESTOSTERONE: Testosterone: 190 ng/dL — ABNORMAL LOW (ref 264–916)

## 2020-02-01 ENCOUNTER — Ambulatory Visit (INDEPENDENT_AMBULATORY_CARE_PROVIDER_SITE_OTHER): Payer: Medicare Other

## 2020-02-01 DIAGNOSIS — E291 Testicular hypofunction: Secondary | ICD-10-CM | POA: Diagnosis not present

## 2020-02-01 DIAGNOSIS — R7989 Other specified abnormal findings of blood chemistry: Secondary | ICD-10-CM

## 2020-02-01 NOTE — Progress Notes (Signed)
Patient presented for Testosterone injection to left Glute, patient voiced no concerns nor showed any signs of distress during injection.

## 2020-02-02 NOTE — Progress Notes (Signed)
I called & informed patient of below. He stated that he would actually contact his urologist at [facility] that had initially prescribed testosterone.

## 2020-02-11 DIAGNOSIS — Z23 Encounter for immunization: Secondary | ICD-10-CM | POA: Diagnosis not present

## 2020-02-15 ENCOUNTER — Ambulatory Visit (INDEPENDENT_AMBULATORY_CARE_PROVIDER_SITE_OTHER): Payer: Medicare Other | Admitting: *Deleted

## 2020-02-15 ENCOUNTER — Other Ambulatory Visit: Payer: Self-pay

## 2020-02-15 DIAGNOSIS — R7989 Other specified abnormal findings of blood chemistry: Secondary | ICD-10-CM | POA: Diagnosis not present

## 2020-02-15 MED ORDER — TESTOSTERONE CYPIONATE 200 MG/ML IM SOLN
100.0000 mg | Freq: Once | INTRAMUSCULAR | Status: AC
  Administered 2020-02-15: 100 mg via INTRAMUSCULAR

## 2020-02-15 NOTE — Progress Notes (Signed)
Patient presented for testosterone injection to RUQ, patient voiced no concerns during injections, and no complaints. Injection site was clear no redness.

## 2020-02-29 ENCOUNTER — Other Ambulatory Visit: Payer: Self-pay

## 2020-02-29 ENCOUNTER — Ambulatory Visit (INDEPENDENT_AMBULATORY_CARE_PROVIDER_SITE_OTHER): Payer: Medicare Other

## 2020-02-29 DIAGNOSIS — R7989 Other specified abnormal findings of blood chemistry: Secondary | ICD-10-CM

## 2020-02-29 NOTE — Progress Notes (Signed)
Patient presented for Testosterone injection to right Glute, patient voiced no concerns nor showed any signs of distress during injection.

## 2020-03-06 DIAGNOSIS — S80862A Insect bite (nonvenomous), left lower leg, initial encounter: Secondary | ICD-10-CM | POA: Diagnosis not present

## 2020-03-06 DIAGNOSIS — D2262 Melanocytic nevi of left upper limb, including shoulder: Secondary | ICD-10-CM | POA: Diagnosis not present

## 2020-03-06 DIAGNOSIS — L821 Other seborrheic keratosis: Secondary | ICD-10-CM | POA: Diagnosis not present

## 2020-03-06 DIAGNOSIS — D2271 Melanocytic nevi of right lower limb, including hip: Secondary | ICD-10-CM | POA: Diagnosis not present

## 2020-03-06 DIAGNOSIS — S80861A Insect bite (nonvenomous), right lower leg, initial encounter: Secondary | ICD-10-CM | POA: Diagnosis not present

## 2020-03-06 DIAGNOSIS — Z85828 Personal history of other malignant neoplasm of skin: Secondary | ICD-10-CM | POA: Diagnosis not present

## 2020-03-06 DIAGNOSIS — D485 Neoplasm of uncertain behavior of skin: Secondary | ICD-10-CM | POA: Diagnosis not present

## 2020-03-06 DIAGNOSIS — C4442 Squamous cell carcinoma of skin of scalp and neck: Secondary | ICD-10-CM | POA: Diagnosis not present

## 2020-03-06 DIAGNOSIS — D2261 Melanocytic nevi of right upper limb, including shoulder: Secondary | ICD-10-CM | POA: Diagnosis not present

## 2020-03-08 ENCOUNTER — Ambulatory Visit: Payer: Medicare Other | Admitting: Internal Medicine

## 2020-03-14 ENCOUNTER — Ambulatory Visit (INDEPENDENT_AMBULATORY_CARE_PROVIDER_SITE_OTHER): Payer: Medicare Other | Admitting: *Deleted

## 2020-03-14 ENCOUNTER — Other Ambulatory Visit: Payer: Self-pay

## 2020-03-14 DIAGNOSIS — R7989 Other specified abnormal findings of blood chemistry: Secondary | ICD-10-CM

## 2020-03-15 MED ORDER — TESTOSTERONE CYPIONATE 200 MG/ML IM SOLN
100.0000 mg | Freq: Once | INTRAMUSCULAR | Status: AC
  Administered 2020-03-14: 100 mg via INTRAMUSCULAR

## 2020-03-15 NOTE — Progress Notes (Signed)
Patient received testosterone injection in upper left outer quadrant, patient voiced no concern or complaints.

## 2020-03-28 ENCOUNTER — Other Ambulatory Visit: Payer: Self-pay

## 2020-03-28 ENCOUNTER — Ambulatory Visit (INDEPENDENT_AMBULATORY_CARE_PROVIDER_SITE_OTHER): Payer: Medicare Other

## 2020-03-28 DIAGNOSIS — C4442 Squamous cell carcinoma of skin of scalp and neck: Secondary | ICD-10-CM | POA: Diagnosis not present

## 2020-03-28 DIAGNOSIS — R7989 Other specified abnormal findings of blood chemistry: Secondary | ICD-10-CM

## 2020-03-28 NOTE — Progress Notes (Signed)
Patient presented for Testosterone injection to left glute, patient voiced no concerns nor showed any signs of distress during injection.  

## 2020-04-11 ENCOUNTER — Other Ambulatory Visit: Payer: Self-pay

## 2020-04-11 ENCOUNTER — Ambulatory Visit (INDEPENDENT_AMBULATORY_CARE_PROVIDER_SITE_OTHER): Payer: Medicare Other

## 2020-04-11 DIAGNOSIS — R7989 Other specified abnormal findings of blood chemistry: Secondary | ICD-10-CM | POA: Diagnosis not present

## 2020-04-11 NOTE — Progress Notes (Signed)
Patient presented for Depo-testosterone injection to right glute, patient voiced no concerns nor showed any signs of distress during injection.

## 2020-04-24 DIAGNOSIS — E291 Testicular hypofunction: Secondary | ICD-10-CM | POA: Diagnosis not present

## 2020-04-24 DIAGNOSIS — Z79899 Other long term (current) drug therapy: Secondary | ICD-10-CM | POA: Diagnosis not present

## 2020-04-24 DIAGNOSIS — N5203 Combined arterial insufficiency and corporo-venous occlusive erectile dysfunction: Secondary | ICD-10-CM | POA: Diagnosis not present

## 2020-04-24 DIAGNOSIS — R972 Elevated prostate specific antigen [PSA]: Secondary | ICD-10-CM | POA: Diagnosis not present

## 2020-04-25 ENCOUNTER — Ambulatory Visit (INDEPENDENT_AMBULATORY_CARE_PROVIDER_SITE_OTHER): Payer: Medicare Other

## 2020-04-25 ENCOUNTER — Other Ambulatory Visit: Payer: Self-pay

## 2020-04-25 DIAGNOSIS — R7989 Other specified abnormal findings of blood chemistry: Secondary | ICD-10-CM

## 2020-04-25 MED ORDER — TESTOSTERONE CYPIONATE 200 MG/ML IM SOLN
100.0000 mg | INTRAMUSCULAR | Status: AC
  Administered 2020-04-25 – 2020-09-12 (×9): 100 mg via INTRAMUSCULAR

## 2020-04-25 NOTE — Progress Notes (Signed)
Patient presented for testosterone injection to left glute, patient voiced no concerns nor showed any signs of distress during injection.

## 2020-05-04 ENCOUNTER — Telehealth: Payer: Self-pay | Admitting: Internal Medicine

## 2020-05-04 NOTE — Telephone Encounter (Signed)
Pt called wanted to discuss his testerone injections

## 2020-05-07 NOTE — Telephone Encounter (Signed)
Patient says there is a large lump at the injection site, and that this time the injection was more painful than normal.  Area is not red or hot to touch per patient, just there is lump at injection site that is uncomfortable. Advised applying ice for 15 minute intervals and letting us know if anything changes . Patient wanted to know if the lump should be examined.

## 2020-05-07 NOTE — Telephone Encounter (Signed)
IF THERE IS NO BRUISING, REDNESS OR DRAINAGE,  DOES NOT NEED TO BE SEEN

## 2020-05-07 NOTE — Telephone Encounter (Signed)
Called and notified patient to continue to observe area if develops drainage , bruising or redness would need to be seen otherwise to apply ice and monitor.

## 2020-05-09 ENCOUNTER — Other Ambulatory Visit: Payer: Self-pay

## 2020-05-09 ENCOUNTER — Ambulatory Visit (INDEPENDENT_AMBULATORY_CARE_PROVIDER_SITE_OTHER): Payer: Medicare Other

## 2020-05-09 DIAGNOSIS — R7989 Other specified abnormal findings of blood chemistry: Secondary | ICD-10-CM | POA: Diagnosis not present

## 2020-05-09 NOTE — Progress Notes (Signed)
Patient presented for Testosterone injection to right glute, patient voiced no concerns nor showed any signs of distress during injection.

## 2020-05-10 NOTE — Progress Notes (Signed)
Patient came in for Testosterone shot. Administered shot into right glute. Patient voiced no concern nor showed any distress during or after injection.

## 2020-05-24 ENCOUNTER — Ambulatory Visit (INDEPENDENT_AMBULATORY_CARE_PROVIDER_SITE_OTHER): Payer: Medicare Other

## 2020-05-24 ENCOUNTER — Other Ambulatory Visit: Payer: Self-pay

## 2020-05-24 DIAGNOSIS — R7989 Other specified abnormal findings of blood chemistry: Secondary | ICD-10-CM

## 2020-05-24 NOTE — Progress Notes (Signed)
Patient presented for testosterone injection to left glute, patient voiced no concerns nor showed any signs of distress during injection.   Injection given by Azzel

## 2020-06-06 ENCOUNTER — Ambulatory Visit: Payer: Medicare Other

## 2020-06-12 DIAGNOSIS — M1712 Unilateral primary osteoarthritis, left knee: Secondary | ICD-10-CM | POA: Diagnosis not present

## 2020-06-20 ENCOUNTER — Other Ambulatory Visit: Payer: Self-pay

## 2020-06-20 ENCOUNTER — Ambulatory Visit (INDEPENDENT_AMBULATORY_CARE_PROVIDER_SITE_OTHER): Payer: Medicare Other

## 2020-06-20 DIAGNOSIS — R7989 Other specified abnormal findings of blood chemistry: Secondary | ICD-10-CM

## 2020-06-20 NOTE — Progress Notes (Signed)
Patient presented for testosterone injection to left glute, patient voiced no concerns nor showed any signs of distress during injection. 

## 2020-06-26 DIAGNOSIS — T162XXA Foreign body in left ear, initial encounter: Secondary | ICD-10-CM | POA: Diagnosis not present

## 2020-07-04 ENCOUNTER — Other Ambulatory Visit: Payer: Self-pay

## 2020-07-04 ENCOUNTER — Ambulatory Visit (INDEPENDENT_AMBULATORY_CARE_PROVIDER_SITE_OTHER): Payer: Medicare Other

## 2020-07-04 DIAGNOSIS — R7989 Other specified abnormal findings of blood chemistry: Secondary | ICD-10-CM

## 2020-07-04 NOTE — Progress Notes (Signed)
Patient presented for Testosterone injection to right glute, patient voiced no concerns nor showed any signs of distress during injection.

## 2020-07-18 ENCOUNTER — Other Ambulatory Visit: Payer: Self-pay

## 2020-07-18 ENCOUNTER — Ambulatory Visit (INDEPENDENT_AMBULATORY_CARE_PROVIDER_SITE_OTHER): Payer: Medicare Other

## 2020-07-18 DIAGNOSIS — R7989 Other specified abnormal findings of blood chemistry: Secondary | ICD-10-CM | POA: Diagnosis not present

## 2020-07-18 NOTE — Progress Notes (Signed)
Bobby Rich presents today for injection per MD orders. Testosterone injection  administered IM in right Gluteal. Administration without incident. Patient tolerated well. Vedanth Sirico,cma

## 2020-07-31 DIAGNOSIS — D2262 Melanocytic nevi of left upper limb, including shoulder: Secondary | ICD-10-CM | POA: Diagnosis not present

## 2020-07-31 DIAGNOSIS — D2261 Melanocytic nevi of right upper limb, including shoulder: Secondary | ICD-10-CM | POA: Diagnosis not present

## 2020-07-31 DIAGNOSIS — L538 Other specified erythematous conditions: Secondary | ICD-10-CM | POA: Diagnosis not present

## 2020-07-31 DIAGNOSIS — D2271 Melanocytic nevi of right lower limb, including hip: Secondary | ICD-10-CM | POA: Diagnosis not present

## 2020-07-31 DIAGNOSIS — D225 Melanocytic nevi of trunk: Secondary | ICD-10-CM | POA: Diagnosis not present

## 2020-07-31 DIAGNOSIS — B078 Other viral warts: Secondary | ICD-10-CM | POA: Diagnosis not present

## 2020-07-31 DIAGNOSIS — Z85828 Personal history of other malignant neoplasm of skin: Secondary | ICD-10-CM | POA: Diagnosis not present

## 2020-08-01 ENCOUNTER — Ambulatory Visit (INDEPENDENT_AMBULATORY_CARE_PROVIDER_SITE_OTHER): Payer: Medicare Other

## 2020-08-01 ENCOUNTER — Other Ambulatory Visit: Payer: Self-pay

## 2020-08-01 DIAGNOSIS — R7989 Other specified abnormal findings of blood chemistry: Secondary | ICD-10-CM | POA: Diagnosis not present

## 2020-08-01 NOTE — Progress Notes (Signed)
Patient presented for testosterone injection to left glute, patient voiced no concerns nor showed any signs of distress during injection. 

## 2020-08-09 ENCOUNTER — Other Ambulatory Visit: Payer: Self-pay | Admitting: Internal Medicine

## 2020-08-15 ENCOUNTER — Ambulatory Visit: Payer: Medicare Other

## 2020-08-21 DIAGNOSIS — M5136 Other intervertebral disc degeneration, lumbar region: Secondary | ICD-10-CM | POA: Diagnosis not present

## 2020-08-21 DIAGNOSIS — M48062 Spinal stenosis, lumbar region with neurogenic claudication: Secondary | ICD-10-CM | POA: Diagnosis not present

## 2020-08-22 ENCOUNTER — Other Ambulatory Visit: Payer: Self-pay | Admitting: Orthopedic Surgery

## 2020-08-22 DIAGNOSIS — M48062 Spinal stenosis, lumbar region with neurogenic claudication: Secondary | ICD-10-CM

## 2020-08-22 DIAGNOSIS — M5136 Other intervertebral disc degeneration, lumbar region: Secondary | ICD-10-CM

## 2020-08-29 ENCOUNTER — Ambulatory Visit (INDEPENDENT_AMBULATORY_CARE_PROVIDER_SITE_OTHER): Payer: Medicare Other

## 2020-08-29 ENCOUNTER — Other Ambulatory Visit: Payer: Self-pay

## 2020-08-29 DIAGNOSIS — R7989 Other specified abnormal findings of blood chemistry: Secondary | ICD-10-CM | POA: Diagnosis not present

## 2020-08-29 NOTE — Progress Notes (Signed)
Patient presented for Testosterone injection to left glute, patient voiced no concerns nor showed any signs of distress during injection. Next injection due

## 2020-09-06 DIAGNOSIS — H9122 Sudden idiopathic hearing loss, left ear: Secondary | ICD-10-CM | POA: Diagnosis not present

## 2020-09-06 DIAGNOSIS — H903 Sensorineural hearing loss, bilateral: Secondary | ICD-10-CM | POA: Diagnosis not present

## 2020-09-10 DIAGNOSIS — M47812 Spondylosis without myelopathy or radiculopathy, cervical region: Secondary | ICD-10-CM | POA: Diagnosis not present

## 2020-09-10 DIAGNOSIS — M47814 Spondylosis without myelopathy or radiculopathy, thoracic region: Secondary | ICD-10-CM | POA: Diagnosis not present

## 2020-09-10 DIAGNOSIS — M4626 Osteomyelitis of vertebra, lumbar region: Secondary | ICD-10-CM | POA: Diagnosis not present

## 2020-09-10 DIAGNOSIS — M4316 Spondylolisthesis, lumbar region: Secondary | ICD-10-CM | POA: Diagnosis not present

## 2020-09-10 DIAGNOSIS — M4030 Flatback syndrome, site unspecified: Secondary | ICD-10-CM | POA: Diagnosis not present

## 2020-09-10 DIAGNOSIS — M47816 Spondylosis without myelopathy or radiculopathy, lumbar region: Secondary | ICD-10-CM | POA: Diagnosis not present

## 2020-09-12 ENCOUNTER — Ambulatory Visit (INDEPENDENT_AMBULATORY_CARE_PROVIDER_SITE_OTHER): Payer: Medicare Other

## 2020-09-12 ENCOUNTER — Other Ambulatory Visit: Payer: Self-pay

## 2020-09-12 DIAGNOSIS — R7989 Other specified abnormal findings of blood chemistry: Secondary | ICD-10-CM | POA: Diagnosis not present

## 2020-09-12 NOTE — Progress Notes (Signed)
Patient presented for testosterone injection to right glute, patient voiced no concerns nor showed any signs of distress during injection.

## 2020-09-13 ENCOUNTER — Ambulatory Visit
Admission: RE | Admit: 2020-09-13 | Discharge: 2020-09-13 | Disposition: A | Discharge status: Home / Self Care | Payer: Medicare Other | Source: Ambulatory Visit | Attending: Orthopedic Surgery | Admitting: Orthopedic Surgery

## 2020-09-13 ENCOUNTER — Other Ambulatory Visit: Payer: Self-pay

## 2020-09-13 DIAGNOSIS — M48061 Spinal stenosis, lumbar region without neurogenic claudication: Secondary | ICD-10-CM | POA: Diagnosis not present

## 2020-09-13 DIAGNOSIS — M545 Low back pain, unspecified: Secondary | ICD-10-CM | POA: Diagnosis not present

## 2020-09-13 DIAGNOSIS — M5136 Other intervertebral disc degeneration, lumbar region: Secondary | ICD-10-CM

## 2020-09-13 DIAGNOSIS — M5126 Other intervertebral disc displacement, lumbar region: Secondary | ICD-10-CM | POA: Diagnosis not present

## 2020-09-13 DIAGNOSIS — Q7649 Other congenital malformations of spine, not associated with scoliosis: Secondary | ICD-10-CM | POA: Diagnosis not present

## 2020-09-13 DIAGNOSIS — M48062 Spinal stenosis, lumbar region with neurogenic claudication: Secondary | ICD-10-CM | POA: Diagnosis not present

## 2020-09-13 DIAGNOSIS — M47816 Spondylosis without myelopathy or radiculopathy, lumbar region: Secondary | ICD-10-CM | POA: Diagnosis not present

## 2020-09-17 ENCOUNTER — Ambulatory Visit (INDEPENDENT_AMBULATORY_CARE_PROVIDER_SITE_OTHER): Payer: Medicare Other

## 2020-09-17 VITALS — Ht 69.0 in | Wt 225.0 lb

## 2020-09-17 DIAGNOSIS — Z0000 Encounter for general adult medical examination without abnormal findings: Secondary | ICD-10-CM

## 2020-09-17 NOTE — Patient Instructions (Addendum)
Bobby Rich , Thank you for taking time to come for your Medicare Wellness Visit. I appreciate your ongoing commitment to your health goals. Please review the following plan we discussed and let me know if I can assist you in the future.   These are the goals we discussed: Goals    . Follow up with Primary Care Provider     As needed       This is a list of the screening recommended for you and due dates:  Health Maintenance  Topic Date Due  . Flu Shot  07/01/2020  . Colon Cancer Screening  01/29/2022  . Tetanus Vaccine  02/11/2023  . COVID-19 Vaccine  Completed  .  Hepatitis C: One time screening is recommended by Center for Disease Control  (CDC) for  adults born from [birth year] through 1963.   Completed  . Pneumonia vaccines  Completed    Immunizations Immunization History  Administered Date(s) Administered  . Fluad Quad(high Dose 65+) 08/02/2019  . Influenza Split 02/10/2013  . Influenza, High Dose Seasonal PF 08/18/2018  . Influenza,inj,Quad PF,6+ Mos 10/07/2013, 11/01/2014, 11/20/2015, 07/22/2016  . Influenza-Unspecified 10/07/2013, 11/01/2014, 11/20/2015, 07/22/2016  . Moderna SARS-COVID-2 Vaccination 01/14/2020, 02/11/2020  . Pneumococcal Conjugate-13 09/02/2019  . Pneumococcal Polysaccharide-23 08/26/2018  . Tdap 02/10/2013  . Zoster 06/11/2014   Keep all routine maintenance appointments.   Next scheduled nurse visit testosterone injection 09/26/20, 10/10/20, 10/24/20  Follow up 09/21/20   Advanced directives: End of life planning; Advance aging; Advanced directives discussed.  Copy of current HCPOA/Living Will requested.    Follow up in one year for your annual wellness visit.  Preventive Care 14 Years and Older, Male Preventive care refers to lifestyle choices and visits with your health care provider that can promote health and wellness. What does preventive care include?  A yearly physical exam. This is also called an annual well check.  Dental exams once or  twice a year.  Routine eye exams. Ask your health care provider how often you should have your eyes checked.  Personal lifestyle choices, including:  Daily care of your teeth and gums.  Regular physical activity.  Eating a healthy diet.  Avoiding tobacco and drug use.  Limiting alcohol use.  Practicing safe sex.  Taking low doses of aspirin every day.  Taking vitamin and mineral supplements as recommended by your health care provider. What happens during an annual well check? The services and screenings done by your health care provider during your annual well check will depend on your age, overall health, lifestyle risk factors, and family history of disease. Counseling  Your health care provider may ask you questions about your:  Alcohol use.  Tobacco use.  Drug use.  Emotional well-being.  Home and relationship well-being.  Sexual activity.  Eating habits.  History of falls.  Memory and ability to understand (cognition).  Work and work Statistician. Screening  You may have the following tests or measurements:  Height, weight, and BMI.  Blood pressure.  Lipid and cholesterol levels. These may be checked every 5 years, or more frequently if you are over 95 years old.  Skin check.  Lung cancer screening. You may have this screening every year starting at age 55 if you have a 30-pack-year history of smoking and currently smoke or have quit within the past 15 years.  Fecal occult blood test (FOBT) of the stool. You may have this test every year starting at age 66.  Flexible sigmoidoscopy or colonoscopy. You may have  a sigmoidoscopy every 5 years or a colonoscopy every 10 years starting at age 43.  Prostate cancer screening. Recommendations will vary depending on your family history and other risks.  Hepatitis C blood test.  Hepatitis B blood test.  Sexually transmitted disease (STD) testing.  Diabetes screening. This is done by checking your blood  sugar (glucose) after you have not eaten for a while (fasting). You may have this done every 1-3 years.  Abdominal aortic aneurysm (AAA) screening. You may need this if you are a current or former smoker.  Osteoporosis. You may be screened starting at age 17 if you are at high risk. Talk with your health care provider about your test results, treatment options, and if necessary, the need for more tests. Vaccines  Your health care provider may recommend certain vaccines, such as:  Influenza vaccine. This is recommended every year.  Tetanus, diphtheria, and acellular pertussis (Tdap, Td) vaccine. You may need a Td booster every 10 years.  Zoster vaccine. You may need this after age 24.  Pneumococcal 13-valent conjugate (PCV13) vaccine. One dose is recommended after age 30.  Pneumococcal polysaccharide (PPSV23) vaccine. One dose is recommended after age 76. Talk to your health care provider about which screenings and vaccines you need and how often you need them. This information is not intended to replace advice given to you by your health care provider. Make sure you discuss any questions you have with your health care provider. Document Released: 12/14/2015 Document Revised: 08/06/2016 Document Reviewed: 09/18/2015 Elsevier Interactive Patient Education  2017 [place] Prevention in the Home Falls can cause injuries. They can happen to people of all ages. There are many things you can do to make your home safe and to help prevent falls. What can I do on the outside of my home?  Regularly fix the edges of walkways and driveways and fix any cracks.  Remove anything that might make you trip as you walk through a door, such as a raised step or threshold.  Trim any bushes or trees on the path to your home.  Use bright outdoor lighting.  Clear any walking paths of anything that might make someone trip, such as rocks or tools.  Regularly check to see if handrails are loose or  broken. Make sure that both sides of any steps have handrails.  Any raised decks and porches should have guardrails on the edges.  Have any leaves, snow, or ice cleared regularly.  Use sand or salt on walking paths during winter.  Clean up any spills in your garage right away. This includes oil or grease spills. What can I do in the bathroom?  Use night lights.  Install grab bars by the toilet and in the tub and shower. Do not use towel bars as grab bars.  Use non-skid mats or decals in the tub or shower.  If you need to sit down in the shower, use a plastic, non-slip stool.  Keep the floor dry. Clean up any water that spills on the floor as soon as it happens.  Remove soap buildup in the tub or shower regularly.  Attach bath mats securely with double-sided non-slip rug tape.  Do not have throw rugs and other things on the floor that can make you trip. What can I do in the bedroom?  Use night lights.  Make sure that you have a light by your bed that is easy to reach.  Do not use any sheets or blankets that  are too big for your bed. They should not hang down onto the floor.  Have a firm chair that has side arms. You can use this for support while you get dressed.  Do not have throw rugs and other things on the floor that can make you trip. What can I do in the kitchen?  Clean up any spills right away.  Avoid walking on wet floors.  Keep items that you use a lot in easy-to-reach places.  If you need to reach something above you, use a strong step stool that has a grab bar.  Keep electrical cords out of the way.  Do not use floor polish or wax that makes floors slippery. If you must use wax, use non-skid floor wax.  Do not have throw rugs and other things on the floor that can make you trip. What can I do with my stairs?  Do not leave any items on the stairs.  Make sure that there are handrails on both sides of the stairs and use them. Fix handrails that are  broken or loose. Make sure that handrails are as long as the stairways.  Check any carpeting to make sure that it is firmly attached to the stairs. Fix any carpet that is loose or worn.  Avoid having throw rugs at the top or bottom of the stairs. If you do have throw rugs, attach them to the floor with carpet tape.  Make sure that you have a light switch at the top of the stairs and the bottom of the stairs. If you do not have them, ask someone to add them for you. What else can I do to help prevent falls?  Wear shoes that:  Do not have high heels.  Have rubber bottoms.  Are comfortable and fit you well.  Are closed at the toe. Do not wear sandals.  If you use a stepladder:  Make sure that it is fully opened. Do not climb a closed stepladder.  Make sure that both sides of the stepladder are locked into place.  Ask someone to hold it for you, if possible.  Clearly mark and make sure that you can see:  Any grab bars or handrails.  First and last steps.  Where the edge of each step is.  Use tools that help you move around (mobility aids) if they are needed. These include:  Canes.  Walkers.  Scooters.  Crutches.  Turn on the lights when you go into a dark area. Replace any light bulbs as soon as they burn out.  Set up your furniture so you have a clear path. Avoid moving your furniture around.  If any of your floors are uneven, fix them.  If there are any pets around you, be aware of where they are.  Review your medicines with your doctor. Some medicines can make you feel dizzy. This can increase your chance of falling. Ask your doctor what other things that you can do to help prevent falls. This information is not intended to replace advice given to you by your health care provider. Make sure you discuss any questions you have with your health care provider. Document Released: 09/13/2009 Document Revised: 04/24/2016 Document Reviewed: 12/22/2014 Elsevier  Interactive Patient Education  2017 [employer].

## 2020-09-17 NOTE — Progress Notes (Addendum)
Subjective:   Bobby Rich is a 69 y.o. male who presents for Medicare Annual/Subsequent preventive examination.  Review of Systems    No ROS.  Medicare Wellness Virtual Visit.    Cardiac Risk Factors include: advanced age (>3men, >3 women);hypertension;male gender     Objective:    Today's Vitals   09/17/20 1102  Weight: 225 lb (102.1 kg)  Height: 5\' 9"  (1.753 m)   Body mass index is 33.23 kg/m.  Advanced Directives 09/17/2020 09/15/2019 07/22/2018  Does Patient Have a Medical Advance Directive? Yes No Yes  Type of Paramedic of [place];Living will - [place];Living will  Does patient want to make changes to medical advance directive? No - Patient declined No - Patient declined Yes (Inpatient - patient requests chaplain consult to change a medical advance directive)  Copy of [place] in Chart? No - copy requested - No - copy requested    Current Medications (verified) Outpatient Encounter Medications as of 09/17/2020  Medication Sig   aspirin 81 MG tablet Take 81 mg by mouth daily.    atorvastatin (LIPITOR) 20 MG tablet TAKE 1 TABLET DAILY   Coenzyme Q10 (CO Q-10) 100 MG CAPS Take 1 capsule by mouth daily.   LACTOBACILLUS PO Take 1 capsule by mouth daily.   meloxicam (MOBIC) 15 MG tablet TAKE 1 TABLET DAILY   Multiple Vitamin (MULTI-VITAMIN) tablet Take by mouth.   Omega-3 Fatty Acids (FISH OIL) 1000 MG CAPS Take by mouth.   omeprazole (PRILOSEC) 40 MG capsule TAKE 1 CAPSULE DAILY   sildenafil (VIAGRA) 100 MG tablet Take 1 tablet (100 mg total) by mouth daily as needed for erectile dysfunction.   valsartan-hydrochlorothiazide (DIOVAN-HCT) 80-12.5 MG tablet TAKE 1 TABLET DAILY   Facility-Administered Encounter Medications as of 09/17/2020  Medication   testosterone cypionate (DEPOTESTOSTERONE CYPIONATE) injection 100 mg    Allergies (verified) Other   History: Past Medical History:  Diagnosis  Date   Cancer (Bobby Rich)    basel cell skin cancer   Depression    GERD (gastroesophageal reflux disease)    Hyperlipidemia    Hypertension    Low testosterone    Past Surgical History:  Procedure Laterality Date   COSMETIC SURGERY     HERNIA REPAIR Bilateral    with mesh    LUMBAR LAMINECTOMY  08/2016   SPINE SURGERY  2012   Bobby Rich L3 4 5    Family History  Problem Relation Age of Onset   Stroke Mother    Early death Father 30   Heart disease Father 69       AMI    Heart disease Brother 80       AMI   Social History   Socioeconomic History   Marital status: Single    Spouse name: Not on file   Number of children: Not on file   Years of education: Not on file   Highest education level: Not on file  Occupational History   Not on file  Tobacco Use   Smoking status: Never Smoker   Smokeless tobacco: Never Used  Substance and Sexual Activity   Alcohol use: Yes    Comment: social   Drug use: No   Sexual activity: Yes  Other Topics Concern   Not on file  Social History Narrative   Not on file   Social Determinants of Health   Financial Resource Strain:    Difficulty of Paying Living Expenses: Not on file  Food Insecurity: No Food Insecurity   Worried About Charity fundraiser in the Last Year: Never true   Ran Out of Food in the Last Year: Never true  Transportation Needs: No Transportation Needs   Lack of Transportation (Medical): No   Lack of Transportation (Non-Medical): No  Physical Activity:    Days of Exercise per Week: Not on file   Minutes of Exercise per Session: Not on file  Stress: Stress Concern Present   Feeling of Stress : To some extent  Social Connections:    Frequency of Communication with Friends and Family: Not on file   Frequency of Social Gatherings with Friends and Family: Not on file   Attends Religious Services: Not on Electrical engineer or Organizations: Not on file   Attends Archivist Meetings: Not on file    Marital Status: Not on file    Tobacco Counseling Counseling given: Not Answered   Clinical Intake:  Pre-visit preparation completed: Yes        Diabetes: No  How often do you need to have someone help you when you read instructions, pamphlets, or other written materials from your doctor or pharmacy?: 1 - Never Interpreter Needed?: No      Activities of Daily Living In your present state of health, do you have any difficulty performing the following activities: 09/17/2020  Hearing? N  Vision? N  Difficulty concentrating or making decisions? N  Walking or climbing stairs? N  Dressing or bathing? N  Doing errands, shopping? N  Preparing Food and eating ? N  Using the Toilet? N  In the past six months, have you accidently leaked urine? N  Do you have problems with loss of bowel control? N  Managing your Medications? N  Managing your Finances? N  Housekeeping or managing your Housekeeping? N  Some recent data might be hidden    Patient Care Team: Crecencio Mc, MD as PCP - General (Internal Medicine)  Indicate any recent Medical Services you may have received from other than [facility] providers in the past year (date may be approximate).     Assessment:   This is a routine wellness examination for Bobby Rich.  I connected with Bobby Rich today by telephone and verified that I am speaking with the correct person using two identifiers. Location patient: home Location provider: work Persons participating in the virtual visit: patient, Marine scientist.    I discussed the limitations, risks, security and privacy concerns of performing an evaluation and management service by telephone and the availability of in person appointments. The patient expressed understanding and verbally consented to this telephonic visit.    Interactive audio and video telecommunications were attempted between this provider and patient, however failed, due to patient having technical difficulties OR patient did  not have access to video capability.  We continued and completed visit with audio only.  Some vital signs may be absent or patient reported.   Hearing/Vision screen  Hearing Screening   125Hz  250Hz  500Hz  1000Hz  2000Hz  3000Hz  4000Hz  6000Hz  8000Hz   Right ear:           Left ear:           Comments: Followed by [place] and  ENT Tinnitus Hearing aids  Vision Screening Comments: Visual acuity not assessed, virtual visit. They have seen their ophthalmologist in the last 12 months.   Dietary issues and exercise activities discussed: Current Exercise Habits: Home exercise routine, Type of exercise: walking, Intensity: Mild  Goals      Follow up with Primary Care Provider     As needed       Depression Screen PHQ 2/9 Scores 09/17/2020 01/24/2020 09/15/2019 08/26/2018 10/15/2016  PHQ - 2 Score 3 0 0 0 0  PHQ- 9 Score 5 - - 1 -    Fall Risk Fall Risk  09/17/2020 01/24/2020 09/15/2019 09/02/2019 08/26/2018  Falls in the past year? 0 0 0 0 No  Number falls in past yr: 0 0 - - -  Follow up Falls evaluation completed Falls evaluation completed - Falls evaluation completed -   Handrails in use when climbing stairs? Yes Home free of loose throw rugs in walkways, pet beds, electrical cords, etc? Yes  Adequate lighting in your home to reduce risk of falls? Yes   ASSISTIVE DEVICES UTILIZED TO PREVENT FALLS: Use of a cane, walker or w/c? No   TIMED UP AND GO: Was the test performed? No . Virtual visit.   Cognitive Function:  Patient is alert and oriented x3.  Denies difficulty focusing, making decisions, memory loss. Currently employed completing database implementation.   6CIT Screen 09/15/2019  What Year? 0 points  What month? 0 points  What time? 0 points  Count back from 20 0 points  Months in reverse 0 points  Repeat phrase 0 points  Total Score 0    Immunizations Immunization History  Administered Date(s) Administered   Fluad Quad(high Dose 65+) 08/02/2019    Influenza Split 02/10/2013   Influenza, High Dose Seasonal PF 08/18/2018   Influenza,inj,Quad PF,6+ Mos 10/07/2013, 11/01/2014, 11/20/2015, 07/22/2016   Influenza-Unspecified 10/07/2013, 11/01/2014, 11/20/2015, 07/22/2016   Moderna SARS-COVID-2 Vaccination 01/14/2020, 02/11/2020   Pneumococcal Conjugate-13 09/02/2019   Pneumococcal Polysaccharide-23 08/26/2018   Tdap 02/10/2013   Zoster 06/11/2014   Health Maintenance Health Maintenance  Topic Date Due   INFLUENZA VACCINE  07/01/2020   COLONOSCOPY  01/29/2022   TETANUS/TDAP  02/11/2023   COVID-19 Vaccine  Completed   Hepatitis C Screening  Completed   PNA vac Low Risk Adult  Completed    Dental Screening: Recommended annual dental exams for proper oral hygiene. Visits every 4 months.   Community Resource Referral / Chronic Care Management: CRR required this visit?  No   CCM required this visit?  No      Plan:   Keep all routine maintenance appointments.   Next scheduled nurse visit testosterone injection 09/26/20, 10/10/20, 10/24/20  Follow up 09/21/20; appointment scheduled for stress/depression. Denies desire to harm self or anyone else.   I have personally reviewed and noted the following in the patients chart:   Medical and social history Use of alcohol, tobacco or illicit drugs  Current medications and supplements Functional ability and status Nutritional status Physical activity Advanced directives List of other physicians Hospitalizations, surgeries, and ER visits in previous 12 months Vitals Screenings to include cognitive, depression, and falls Referrals and appointments  In addition, I have reviewed and discussed with patient certain preventive protocols, quality metrics, and best practice recommendations. A written personalized care plan for preventive services as well as general preventive health recommendations were provided to patient via mychart.     OBrien-Blaney, Keila Turan L, LPN   [id number]     I have reviewed the above information and agree with above.   Deborra Medina, MD

## 2020-09-21 ENCOUNTER — Encounter: Payer: Self-pay | Admitting: Internal Medicine

## 2020-09-21 ENCOUNTER — Other Ambulatory Visit: Payer: Self-pay

## 2020-09-21 ENCOUNTER — Ambulatory Visit (INDEPENDENT_AMBULATORY_CARE_PROVIDER_SITE_OTHER): Payer: Medicare Other | Admitting: Internal Medicine

## 2020-09-21 VITALS — BP 116/68 | HR 91 | Temp 98.3°F | Resp 15 | Ht 69.0 in | Wt 241.6 lb

## 2020-09-21 DIAGNOSIS — Z23 Encounter for immunization: Secondary | ICD-10-CM | POA: Diagnosis not present

## 2020-09-21 DIAGNOSIS — I10 Essential (primary) hypertension: Secondary | ICD-10-CM | POA: Diagnosis not present

## 2020-09-21 DIAGNOSIS — E559 Vitamin D deficiency, unspecified: Secondary | ICD-10-CM

## 2020-09-21 DIAGNOSIS — E538 Deficiency of other specified B group vitamins: Secondary | ICD-10-CM | POA: Diagnosis not present

## 2020-09-21 DIAGNOSIS — E782 Mixed hyperlipidemia: Secondary | ICD-10-CM

## 2020-09-21 DIAGNOSIS — Z0000 Encounter for general adult medical examination without abnormal findings: Secondary | ICD-10-CM

## 2020-09-21 DIAGNOSIS — G894 Chronic pain syndrome: Secondary | ICD-10-CM

## 2020-09-21 DIAGNOSIS — Z8639 Personal history of other endocrine, nutritional and metabolic disease: Secondary | ICD-10-CM

## 2020-09-21 DIAGNOSIS — M5417 Radiculopathy, lumbosacral region: Secondary | ICD-10-CM

## 2020-09-21 DIAGNOSIS — E669 Obesity, unspecified: Secondary | ICD-10-CM

## 2020-09-21 DIAGNOSIS — R5383 Other fatigue: Secondary | ICD-10-CM

## 2020-09-21 DIAGNOSIS — R972 Elevated prostate specific antigen [PSA]: Secondary | ICD-10-CM

## 2020-09-21 DIAGNOSIS — R7303 Prediabetes: Secondary | ICD-10-CM

## 2020-09-21 DIAGNOSIS — F331 Major depressive disorder, recurrent, moderate: Secondary | ICD-10-CM | POA: Diagnosis not present

## 2020-09-21 LAB — TSH: TSH: 1.48 u[IU]/mL (ref 0.35–4.50)

## 2020-09-21 LAB — COMPREHENSIVE METABOLIC PANEL
ALT: 29 U/L (ref 0–53)
AST: 17 U/L (ref 0–37)
Albumin: 4.4 g/dL (ref 3.5–5.2)
Alkaline Phosphatase: 81 U/L (ref 39–117)
BUN: 17 mg/dL (ref 6–23)
CO2: 27 mEq/L (ref 19–32)
Calcium: 9.4 mg/dL (ref 8.4–10.5)
Chloride: 104 mEq/L (ref 96–112)
Creatinine, Ser: 1.02 mg/dL (ref 0.40–1.50)
GFR: 76.14 mL/min (ref >60.00)
Glucose, Bld: 109 mg/dL — ABNORMAL HIGH (ref 70–99)
Potassium: 4.2 mEq/L (ref 3.5–5.1)
Sodium: 139 mEq/L (ref 135–145)
Total Bilirubin: 0.7 mg/dL (ref 0.2–1.2)
Total Protein: 6.5 g/dL (ref 6.0–8.3)

## 2020-09-21 LAB — LIPID PANEL
Cholesterol: 174 mg/dL (ref 0–200)
HDL: 41.3 mg/dL (ref >39.00)
NonHDL: 132.46
Total CHOL/HDL Ratio: 4
Triglycerides: 341 mg/dL — ABNORMAL HIGH (ref 0.0–149.0)
VLDL: 68.2 mg/dL — ABNORMAL HIGH (ref 0.0–40.0)

## 2020-09-21 LAB — CBC WITH DIFFERENTIAL/PLATELET
Basophils Absolute: 0 10*3/uL (ref 0.0–0.1)
Basophils Relative: 0.4 % (ref 0.0–3.0)
Eosinophils Absolute: 0.1 10*3/uL (ref 0.0–0.7)
Eosinophils Relative: 2.4 % (ref 0.0–5.0)
HCT: 46.9 % (ref 39.0–52.0)
Hemoglobin: 15.6 g/dL (ref 13.0–17.0)
Lymphocytes Relative: 27.5 % (ref 12.0–46.0)
Lymphs Abs: 1.7 10*3/uL (ref 0.7–4.0)
MCHC: 33.3 g/dL (ref 30.0–36.0)
MCV: 87.7 fl (ref 78.0–100.0)
Monocytes Absolute: 0.3 10*3/uL (ref 0.1–1.0)
Monocytes Relative: 5.7 % (ref 3.0–12.0)
Neutro Abs: 3.9 10*3/uL (ref 1.4–7.7)
Neutrophils Relative %: 64 % (ref 43.0–77.0)
Platelets: 174 10*3/uL (ref 150.0–400.0)
RBC: 5.35 Mil/uL (ref 4.22–5.81)
RDW: 14.4 % (ref 11.5–15.5)
WBC: 6 10*3/uL (ref 4.0–10.5)

## 2020-09-21 LAB — VITAMIN B12: Vitamin B-12: >1526 pg/mL — ABNORMAL HIGH (ref 211–911)

## 2020-09-21 LAB — LDL CHOLESTEROL, DIRECT: Direct LDL: 101 mg/dL

## 2020-09-21 LAB — VITAMIN D 25 HYDROXY (VIT D DEFICIENCY, FRACTURES): VITD: 48.7 ng/mL (ref 30.00–100.00)

## 2020-09-21 MED ORDER — ZOSTER VAC RECOMB ADJUVANTED 50 MCG/0.5ML IM SUSR: 0.5000 mL | Freq: Once | INTRAMUSCULAR | 1 refills | Status: AC

## 2020-09-21 MED ORDER — CYANOCOBALAMIN 1000 MCG/ML IJ SOLN
1000.0000 ug | Freq: Once | INTRAMUSCULAR | Status: AC
  Administered 2020-09-21: 1000 ug via INTRAMUSCULAR

## 2020-09-21 MED ORDER — TIZANIDINE HCL 4 MG PO TABS: 4.0000 mg | ORAL_TABLET | Freq: Four times a day (QID) | ORAL | 0 refills | Status: DC | PRN

## 2020-09-21 MED ORDER — SERTRALINE HCL 50 MG PO TABS: 50.0000 mg | ORAL_TABLET | Freq: Every day | ORAL | 1 refills | Status: DC

## 2020-09-21 NOTE — Assessment & Plan Note (Signed)
Discussed trial of cymbalta for management of chronic LBP pending outcome of neurosurgical consultation next week

## 2020-09-21 NOTE — Assessment & Plan Note (Addendum)
Recurrent ,  Current episode have been  aggravated by work and financial stressors requiring him to work until age 69 .  Fatigue is his primary symptom,  But given his high stress job,  wellbutrin is not first choice.  Trial of zoloft. RTC 3 weeks

## 2020-09-21 NOTE — Progress Notes (Addendum)
Subjective:  Patient ID: Bobby Rich, male    DOB: [birth year]  Age: 69 y.o. MRN: [id number]  CC: The primary encounter diagnosis was Fatigue, unspecified type. Diagnoses of Mixed hyperlipidemia, Vitamin D deficiency, History of non anemic vitamin B12 deficiency, COVID-19 vaccine regimen to maintain immunity completed, Need for immunization against influenza, Vitamin B12 deficiency, Chronic pain syndrome, Moderate episode of recurrent major depressive disorder ([place]), Obesity (BMI 30.0-34.9), Elevated PSA, less than 10 ng/ml, Prediabetes, Primary hypertension, and L-S radiculopathy were also pertinent to this visit.  HPI Bobby Rich presents for follow up on hypertension, hyperlipidemia and other chronic issues.  This visit occurred during the SARS-CoV-2 public health emergency.  Safety protocols were in place, including screening questions prior to the visit, additional usage of staff PPE, and extensive cleaning of exam room while observing appropriate contact time as indicated for disinfecting solutions.   Cc: worsening depression.  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 Very stressful.   Not enough personnel for the demand of a growing company. Managing multiple projects at once.  Fatigue is  profound, he feels compelled to work until the age of 18 to pay off his mortgage and max out his social security benefits.  Marland Kitchen He is not suicidal and does not feel anxious or hopeless.   Has been in pain constantly from left knee pain .  Has had synvisc injections without improvement.  Back is also hurting constantly. History of MRI/CT scan by Dr. Macario Carls at [place] with follow up next week. Taking meloxocam 15 mg daily for the knee . No gastritis  Has a spinal stimulator which has resolved the sciatica but still has localized lumbar ache with all activities.     Outpatient Medications Prior to Visit  Medication Sig Dispense  Refill  . aspirin 81 MG tablet Take 81 mg by mouth daily.     Marland Kitchen atorvastatin (LIPITOR) 20 MG tablet TAKE 1 TABLET DAILY 90 tablet 3  . Coenzyme Q10 (CO Q-10) 100 MG CAPS Take 1 capsule by mouth daily.    Marland Kitchen LACTOBACILLUS PO Take 1 capsule by mouth daily.    . Multiple Vitamin (MULTI-VITAMIN) tablet Take by mouth.    . Omega-3 Fatty Acids (FISH OIL) 1000 MG CAPS Take by mouth.    Marland Kitchen omeprazole (PRILOSEC) 40 MG capsule TAKE 1 CAPSULE DAILY 90 capsule 3  . sildenafil (VIAGRA) 100 MG tablet Take 1 tablet (100 mg total) by mouth daily as needed for erectile dysfunction. 18 tablet 3  . valsartan-hydrochlorothiazide (DIOVAN-HCT) 80-12.5 MG tablet TAKE 1 TABLET DAILY 90 tablet 3  . meloxicam (MOBIC) 15 MG tablet TAKE 1 TABLET DAILY (Patient not taking: Reported on 09/21/2020) 90 tablet 3   Facility-Administered Medications Prior to Visit  Medication Dose Route Frequency Provider Last Rate Last Admin  . testosterone cypionate (DEPOTESTOSTERONE CYPIONATE) injection 100 mg  100 mg Intramuscular Q14 Days Crecencio Mc, MD   100 mg at 09/12/20 [id number]    Review of Systems;  Patient denies headache, fevers, malaise, unintentional weight loss, skin rash, eye pain, sinus congestion and sinus pain, sore throat, dysphagia,  hemoptysis , cough, dyspnea, wheezing, chest pain, palpitations, orthopnea, edema, abdominal pain, nausea, melena, diarrhea, constipation, flank pain, dysuria, hematuria, urinary  Frequency, nocturia, numbness, tingling, seizures,  Focal weakness, Loss of consciousness,  Tremor, insomnia, depression, anxiety, and suicidal ideation.      Objective:  BP 116/68 (BP Location: Left Arm, Patient Position: Sitting,  Cuff Size: Large)   Pulse 91   Temp 98.3 F (36.8 C) (Oral)   Resp 15   Ht 5\' 9"  (1.753 m)   Wt 241 lb 9.6 oz (109.6 kg)   SpO2 96%   BMI 35.68 kg/m   BP Readings from Last 3 Encounters:  09/21/20 116/68  09/02/19 130/76  01/28/19 124/78    Wt Readings from Last 3  Encounters:  09/21/20 241 lb 9.6 oz (109.6 kg)  09/17/20 225 lb (102.1 kg)  01/24/20 220 lb (99.8 kg)    General appearance: alert, cooperative and appears stated age Ears: normal TM's and external ear canals both ears Throat: lips, mucosa, and tongue normal; teeth and gums normal Neck: no adenopathy, no carotid bruit, supple, symmetrical, trachea midline and thyroid not enlarged, symmetric, no tenderness/mass/nodules Back: symmetric, no curvature. ROM normal. No CVA tenderness. Lungs: clear to auscultation bilaterally Heart: regular rate and rhythm, S1, S2 normal, no murmur, click, rub or gallop Abdomen: soft, non-tender; bowel sounds normal; no masses,  no organomegaly Pulses: 2+ and symmetric Skin: Skin color, texture, turgor normal. No rashes or lesions Lymph nodes: Cervical, supraclavicular, and axillary nodes normal. Psych: affect normal, makes good eye contact. Mood is somber but not morose.  No fidgeting.   Denies suicidal thoughts   Lab Results  Component Value Date   HGBA1C 6.1 (H) 09/02/2019   HGBA1C 6.0 07/22/2018    Lab Results  Component Value Date   CREATININE 1.02 09/21/2020   CREATININE 1.20 01/26/2020   CREATININE 0.98 09/02/2019    Lab Results  Component Value Date   WBC 6.0 09/21/2020   HGB 15.6 09/21/2020   HCT 46.9 09/21/2020   PLT 174.0 09/21/2020   GLUCOSE 109 (H) 09/21/2020   CHOL 174 09/21/2020   TRIG 341.0 (H) 09/21/2020   HDL 41.30 09/21/2020   LDLDIRECT 101.0 09/21/2020   LDLCALC 182 (H) 09/02/2019   ALT 29 09/21/2020   AST 17 09/21/2020   NA 139 09/21/2020   K 4.2 09/21/2020   CL 104 09/21/2020   CREATININE 1.02 09/21/2020   BUN 17 09/21/2020   CO2 27 09/21/2020   TSH 1.48 09/21/2020   PSA 5.62 (H) 02/02/2019   HGBA1C 6.1 (H) 09/02/2019    CT LUMBAR SPINE WO CONTRAST  Result Date: 09/13/2020 CLINICAL DATA:  Intervertebral disc degeneration lumbar region.Chronic low back pain. Spinal cord stimulator. EXAM: CT LUMBAR SPINE  WITHOUT CONTRAST TECHNIQUE: Multidetector CT imaging of the lumbar spine was performed without intravenous contrast administration. Multiplanar CT image reconstructions were also generated. COMPARISON:  Lumbar MRI 04/19/2014 FINDINGS: Segmentation: L5 partially incorporated into the sacrum. Vertebral body numbering consistent with the prior study. Alignment: Slight retrolisthesis T12-L1, L1-2. 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 Paraspinal and other soft tissues: Negative for paraspinous mass or adenopathy. Abdominal aorta normal in caliber. Spinal cord stimulator enters the spinal canal at the T11-12 level. Disc levels: T11-12: Negative for stenosis T12-L1: Mild retrolisthesis. Mild disc bulging and mild facet degeneration without significant stenosis L1-2: Mild retrolisthesis. Moderate facet degeneration causing mild subarticular stenosis bilaterally. Spinal canal normal in size. L2-3: Disc degeneration with prominent endplate spurring diffusely and moderate to advanced facet hypertrophy bilaterally. Moderate spinal stenosis and moderate subarticular stenosis bilaterally unchanged. L3-4: Posterior laminectomy. Solid interbody fusion which has developed since the prior study. Correlate with surgical history. Bilateral facet hypertrophy. No significant spinal or foraminal stenosis. L4-5: Disc degeneration with diffuse endplate spurring. Moderate  to severe facet degeneration bilaterally. Mild subarticular stenosis bilaterally. Spinal canal adequate in size. L5-S1: Solid interbody bony fusion. This could be degenerative or postsurgical. 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 No significant spinal or foraminal stenosis. IMPRESSION: Moderate spinal stenosis L2-3 with moderate subarticular stenosis bilaterally unchanged from prior MRI Laminectomy L3-4 with solid  interbody fusion. No significant stenosis Mild subarticular stenosis bilaterally L4-5 L5 left transverse process articulates with the sacrum with degenerative change in bony overgrowth. Solid interbody fusion at L5-S1. 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 Electronically Signed   By: Franchot Gallo M.D.   On: 09/13/2020 09:22   MR LUMBAR SPINE WO CONTRAST  Result Date: 09/13/2020 CLINICAL DATA:  Spinal stenosis of lumbar region with neurogenic claudication. EXAM: MRI LUMBAR SPINE WITHOUT CONTRAST TECHNIQUE: Multiplanar, multisequence MR imaging of the lumbar spine was performed. No intravenous contrast was administered. COMPARISON:  09/13/2020 CT lumbar spine. 04/19/2014 MRI lumbar spine. 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 S1 butterfly vertebra with midline cleft. Alignment:  Minimal grade 1 L1-2 and L4-5 retrolisthesis. Vertebrae: Mild bone marrow heterogeneity. No focal osseous lesion. Degenerative related edema underlying the L1-2 endplates. Partial fusion of the L3-4 vertebral bodies. Conus medullaris and cauda equina: Conus extends to the T12 level. Conus and cauda equina appear normal. Disc levels: Multilevel desiccation and disc space loss. L1-2: Disc bulge with central protrusion, ligamentum flavum and bilateral facet hypertrophy. Mild spinal canal, moderate left and severe right neural foraminal narrowing, progressed since prior exam. L2-3: Disc bulge with ligamentum flavum and bilateral facet hypertrophy. Moderate spinal canal and severe bilateral neural foraminal narrowing, grossly unchanged. L3-4: Sequela of prior laminectomy. Partial fusion of the vertebral bodies. No significant disc bulge. Patent spinal canal. Moderate right and mild left neural foraminal narrowing. L4-5: Sequela of prior laminectomy. Disc bulge with superimposed central protrusion. Bilateral facet hypertrophy. There is abutment of the right greater than  left descending L5 nerve roots. Minimal spinal canal, mild left and moderate right neural foraminal narrowing, unchanged. L5-S1: Right predominant disc bulge and facet hypertrophy with mild right neural foraminal narrowing, unchanged. Patent spinal canal and left neural foramen. Patent spinal canal. Paraspinal and other soft tissues: Indwelling neurostimulator. IMPRESSION: Multilevel spondylosis, progressed since prior exam. Moderate L2-3 and mild L1-2 spinal canal narrowing. Moderate to severe bilateral L1-3, right L3-5 neural foraminal narrowing. 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 Electronically Signed   By: Primitivo Gauze M.D.   On: 09/13/2020 09:52    Assessment & Plan:   Problem List Items Addressed This Visit      Unprioritized   Hyperlipidemia    Currently managed with atorvastatin.  Direct LDL is 101;  Elevated triglcyerides likely due to weight gain and lack of exercise.  Will repeat in 56 months and consdier change to crestor if still elevated    Lab Results  Component Value Date   CHOL 174 09/21/2020   HDL 41.30 09/21/2020   LDLCALC 182 (H) 09/02/2019   LDLDIRECT 101.0 09/21/2020   TRIG 341.0 (H) 09/21/2020   CHOLHDL 4 09/21/2020   Lab Results  Component Value Date   ALT 29 09/21/2020   AST 17 09/21/2020   ALKPHOS 81 09/21/2020   BILITOT 0.7 09/21/2020         Relevant Orders   Lipid panel (Completed)   LDL cholesterol, direct (Completed)   Major depressive disorder    Recurrent ,  Current episode have been  aggravated by work and financial stressors requiring him to work until age 22 .  Fatigue is his primary symptom,  But given his high stress job,  wellbutrin is not first choice.  Trial of zoloft. RTC 3 weeks       Relevant Medications   sertraline (ZOLOFT) 50 MG tablet   HTN (hypertension)    Well controlled on current regimen of ARB/thiazide. Renal function stable, no changes today.  Lab Results    Component Value Date   CREATININE 1.02 09/21/2020   Lab Results  Component Value Date   NA 139 09/21/2020   K 4.2 09/21/2020   CL 104 09/21/2020   CO2 27 09/21/2020         Obesity (BMI 30.0-34.9)    I have addressed  BMI and recommended wt loss of 10% of body weight over the next 6 months using a low glycemic index diet and regular exercise a minimum of 5 days per week.        L-S radiculopathy    Seeing Orthopedics.  MRI ordered.  Adding MR for use during spasms      Relevant Medications   tiZANidine (ZANAFLEX) 4 MG tablet   sertraline (ZOLOFT) 50 MG tablet   Chronic pain syndrome    Discussed trial of cymbalta for management of chronic LBP pending outcome of neurosurgical consultation next week      Relevant Medications   tiZANidine (ZANAFLEX) 4 MG tablet   sertraline (ZOLOFT) 50 MG tablet   Elevated PSA, less than 10 ng/ml    Continue follow up with Urology in November  Lab Results  Component Value Date   PSA 5.62 (H) 02/02/2019   PSA 4.90 (H) 08/18/2018   PSA 3.73 07/22/2018          Prediabetes    Last a1c suggested prediabetes .  C urrent fasting glucose is 109 . Will repeat A1c in 6 months   Lab Results  Component Value Date   HGBA1C 6.1 (H) 09/02/2019         Fatigue - Primary    Screening labs normal.  Has treated OSA.  Will treat depression and encourage participating in regular exercise program (orthopedic issues not withstanding) with goal of achieving a minimum of 30 minutes of aerobic activity 5 days per week.   Lab Results  Component Value Date   VITAMINB12 >1526 (H) 09/21/2020   Lab Results  Component Value Date   TSH 1.48 09/21/2020   Lab Results  Component Value Date   WBC 6.0 09/21/2020   HGB 15.6 09/21/2020   HCT 46.9 09/21/2020   MCV 87.7 09/21/2020   PLT 174.0 09/21/2020   Lab Results  Component Value Date   CREATININE 1.02 09/21/2020   Lab Results  Component Value Date   ALT 29 09/21/2020   AST 17 09/21/2020    ALKPHOS 81 09/21/2020   BILITOT 0.7 09/21/2020          Relevant Orders   Comprehensive metabolic panel (Completed)   TSH (Completed)   CBC with Differential/Platelet (Completed)    Other Visit Diagnoses    Vitamin D deficiency       Relevant Orders   VITAMIN D 25 Hydroxy (Vit-D Deficiency, Fractures) (Completed)   History of non anemic vitamin B12 deficiency       Relevant Orders   Vitamin B12 (Completed)   COVID-19 vaccine regimen to maintain immunity completed       Relevant Orders  SARS-CoV-2 Semi-Quantitative Total Antibody, Spike (Completed)   Need for immunization against influenza       Relevant Orders   Flu Vaccine QUAD High Dose(Fluad) (Completed)   Vitamin B12 deficiency       Relevant Medications   cyanocobalamin ((VITAMIN B-12)) injection 1,000 mcg (Completed)    A total of 40 minutes was spent with patient more than half of which was spent in counseling patient on the above mentioned issues , reviewing and explaining recent labs and imaging studies done, and coordination of care.  I have discontinued Bobby Rich "Dick"'s meloxicam. I am also having him start on tiZANidine, sertraline, and Zoster Vaccine Adjuvanted. Additionally, I am having him maintain his aspirin, Co Q-10, LACTOBACILLUS PO, sildenafil, Fish Oil, omeprazole, Multi-Vitamin, atorvastatin, and valsartan-hydrochlorothiazide. We administered cyanocobalamin. We will continue to administer testosterone cypionate.  Meds ordered this encounter  Medications  . tiZANidine (ZANAFLEX) 4 MG tablet    Sig: Take 1 tablet (4 mg total) by mouth every 6 (six) hours as needed for muscle spasms.    Dispense:  90 tablet    Refill:  0  . sertraline (ZOLOFT) 50 MG tablet    Sig: Take 1 tablet (50 mg total) by mouth daily.    Dispense:  90 tablet    Refill:  1  . cyanocobalamin ((VITAMIN B-12)) injection 1,000 mcg  . Zoster Vaccine Adjuvanted [facility]) injection    Sig: Inject 0.5 mLs into the muscle once  for 1 dose.    Dispense:  1 each    Refill:  1    Medications Discontinued During This Encounter  Medication Reason  . meloxicam (MOBIC) 15 MG tablet     Follow-up: Return in about 3 weeks (around 10/12/2020).   Crecencio Mc, MD

## 2020-09-21 NOTE — Patient Instructions (Addendum)
You can add up to 2000 mg of acetominophen (tylenol) every day safely  In divided doses (500 mg every 6 hours  Or 1000 mg every 12 hours.)  You should continue taking a PPI  Trial of zoloft . Start with 1/2 tablet  daily with meal  (breakfast)  To avoid nausea.  Increase dose to 50 mg after one week  3 week follow to assess symptoms

## 2020-09-22 LAB — SARS-COV-2 SEMI-QUANTITATIVE TOTAL ANTIBODY, SPIKE
SARS-CoV-2 Semi-Quant Total Ab: 680.1 U/mL (ref <0.8)
SARS-CoV-2 Spike Ab Interp: POSITIVE

## 2020-09-22 NOTE — Assessment & Plan Note (Signed)
Currently managed with atorvastatin.  Direct LDL is 101;  Elevated triglcyerides likely due to weight gain and lack of exercise.  Will repeat in 56 months and consdier change to crestor if still elevated    Lab Results  Component Value Date   CHOL 174 09/21/2020   HDL 41.30 09/21/2020   LDLCALC 182 (H) 09/02/2019   LDLDIRECT 101.0 09/21/2020   TRIG 341.0 (H) 09/21/2020   CHOLHDL 4 09/21/2020   Lab Results  Component Value Date   ALT 29 09/21/2020   AST 17 09/21/2020   ALKPHOS 81 09/21/2020   BILITOT 0.7 09/21/2020

## 2020-09-22 NOTE — Assessment & Plan Note (Signed)
I have addressed  BMI and recommended wt loss of 10% of body weight over the next 6 months using a low glycemic index diet and regular exercise a minimum of 5 days per week.   

## 2020-09-22 NOTE — Assessment & Plan Note (Signed)
Continue follow up with Urology in November  Lab Results  Component Value Date   PSA 5.62 (H) 02/02/2019   PSA 4.90 (H) 08/18/2018   PSA 3.73 07/22/2018

## 2020-09-22 NOTE — Assessment & Plan Note (Signed)
Screening labs normal.  Has treated OSA.  Will treat depression and encourage participating in regular exercise program (orthopedic issues not withstanding) with goal of achieving a minimum of 30 minutes of aerobic activity 5 days per week.   Lab Results  Component Value Date   VITAMINB12 >1526 (H) 09/21/2020   Lab Results  Component Value Date   TSH 1.48 09/21/2020   Lab Results  Component Value Date   WBC 6.0 09/21/2020   HGB 15.6 09/21/2020   HCT 46.9 09/21/2020   MCV 87.7 09/21/2020   PLT 174.0 09/21/2020   Lab Results  Component Value Date   CREATININE 1.02 09/21/2020   Lab Results  Component Value Date   ALT 29 09/21/2020   AST 17 09/21/2020   ALKPHOS 81 09/21/2020   BILITOT 0.7 09/21/2020

## 2020-09-22 NOTE — Assessment & Plan Note (Signed)
Well controlled on current regimen of ARB/thiazide. Renal function stable, no changes today.  Lab Results  Component Value Date   CREATININE 1.02 09/21/2020   Lab Results  Component Value Date   NA 139 09/21/2020   K 4.2 09/21/2020   CL 104 09/21/2020   CO2 27 09/21/2020

## 2020-09-22 NOTE — Progress Notes (Signed)
Your labs are normal except for Triglycerides which are elevated today to over 300.  These will improve with a low glycemic index /Mediterranean style diet, and regular exercise.  We should repeat in 6 months.   Regards,   Deborra Medina, MD

## 2020-09-22 NOTE — Assessment & Plan Note (Signed)
Annual comprehensive preventive exam was done as well as an evaluation and management of chronic conditions .  During the course of the visit the patient was educated and counseled about appropriate screening and preventive services including :  diabetes screening, lipid analysis with projected  10 year  risk for CAD , nutrition counseling, breast, cervical and colorectal cancer screening, and recommended immunizations.  Printed recommendations for health maintenance screenings was give 

## 2020-09-22 NOTE — Assessment & Plan Note (Signed)
Seeing Orthopedics.  MRI ordered.  Adding MR for use during spasms

## 2020-09-22 NOTE — Assessment & Plan Note (Signed)
Last a1c suggested prediabetes .  C urrent fasting glucose is 109 . Will repeat A1c in 6 months   Lab Results  Component Value Date   HGBA1C 6.1 (H) 09/02/2019

## 2020-09-24 ENCOUNTER — Ambulatory Visit: Payer: Medicare Other | Admitting: Internal Medicine

## 2020-09-24 DIAGNOSIS — H903 Sensorineural hearing loss, bilateral: Secondary | ICD-10-CM | POA: Diagnosis not present

## 2020-09-24 DIAGNOSIS — H9120 Sudden idiopathic hearing loss, unspecified ear: Secondary | ICD-10-CM | POA: Diagnosis not present

## 2020-09-25 DIAGNOSIS — Z1152 Encounter for screening for COVID-19: Secondary | ICD-10-CM | POA: Diagnosis not present

## 2020-09-25 DIAGNOSIS — Z03818 Encounter for observation for suspected exposure to other biological agents ruled out: Secondary | ICD-10-CM | POA: Diagnosis not present

## 2020-09-26 ENCOUNTER — Ambulatory Visit: Payer: Medicare Other

## 2020-09-27 DIAGNOSIS — M4626 Osteomyelitis of vertebra, lumbar region: Secondary | ICD-10-CM | POA: Diagnosis not present

## 2020-10-10 ENCOUNTER — Ambulatory Visit (INDEPENDENT_AMBULATORY_CARE_PROVIDER_SITE_OTHER): Payer: Medicare Other

## 2020-10-10 ENCOUNTER — Other Ambulatory Visit: Payer: Self-pay

## 2020-10-10 DIAGNOSIS — R7989 Other specified abnormal findings of blood chemistry: Secondary | ICD-10-CM

## 2020-10-10 MED ORDER — TESTOSTERONE CYPIONATE 100 MG/ML IM SOLN
100.0000 mg | Freq: Once | INTRAMUSCULAR | Status: AC
  Administered 2020-10-10: 100 mg via INTRAMUSCULAR

## 2020-10-10 NOTE — Progress Notes (Signed)
Patient presented for Testosterone injection to Left glute, patient voiced no concerns nor showed any signs of distress during injection.

## 2020-10-11 ENCOUNTER — Telehealth: Payer: Self-pay | Admitting: Internal Medicine

## 2020-10-11 DIAGNOSIS — B0229 Other postherpetic nervous system involvement: Secondary | ICD-10-CM

## 2020-10-11 NOTE — Telephone Encounter (Signed)
Patient has not had medication given to him since. 2018 please advise.

## 2020-10-11 NOTE — Telephone Encounter (Signed)
Patient called about message from this morning.

## 2020-10-11 NOTE — Telephone Encounter (Signed)
Patient's sciatic nerve has flared up and would like a refill on his gabapentin.

## 2020-10-12 MED ORDER — GABAPENTIN 300 MG PO CAPS: 300.0000 mg | ORAL_CAPSULE | Freq: Three times a day (TID) | ORAL | 0 refills | Status: DC

## 2020-10-12 NOTE — Telephone Encounter (Signed)
Sorry got this message about your patient

## 2020-10-12 NOTE — Telephone Encounter (Signed)
Your Message was sent to wrong provider resulting in delay of 24 hours .    rx sent to [facility]

## 2020-10-22 ENCOUNTER — Encounter: Payer: Self-pay | Admitting: Internal Medicine

## 2020-10-22 ENCOUNTER — Other Ambulatory Visit: Payer: Self-pay

## 2020-10-22 ENCOUNTER — Ambulatory Visit (INDEPENDENT_AMBULATORY_CARE_PROVIDER_SITE_OTHER): Payer: Medicare Other | Admitting: Internal Medicine

## 2020-10-22 DIAGNOSIS — F324 Major depressive disorder, single episode, in partial remission: Secondary | ICD-10-CM

## 2020-10-22 DIAGNOSIS — M5417 Radiculopathy, lumbosacral region: Secondary | ICD-10-CM | POA: Diagnosis not present

## 2020-10-22 MED ORDER — MELOXICAM 15 MG PO TABS: 15.0000 mg | ORAL_TABLET | Freq: Every day | ORAL | 0 refills | Status: DC

## 2020-10-22 MED ORDER — ZOSTER VAC RECOMB ADJUVANTED 50 MCG/0.5ML IM SUSR: 0.5000 mL | Freq: Once | INTRAMUSCULAR | 1 refills | Status: AC

## 2020-10-22 NOTE — Progress Notes (Signed)
Subjective:  Patient ID: Bobby Rich, male    DOB: [birth year]  Age: 69 y.o. MRN: [id number]  CC: Diagnoses of Major depressive disorder with single episode, in partial remission ([place]) and L-S radiculopathy were pertinent to this visit.  HPI Bobby Rich presents for follow up on depression aggravated by work and home stressors and chronic back pain   This visit occurred during the SARS-CoV-2 public health emergency.  Safety protocols were in place, including screening questions prior to the visit, additional usage of staff PPE, and extensive cleaning of exam room while observing appropriate contact time as indicated for disinfecting solutions.    Patient has received both doses of the available COVID 19 vaccine without complications.  Patient continues to mask when outside of the home except when walking in yard or at safe distances from others .  Patient denies any change in mood or development of unhealthy behaviors resuting from the pandemic's restriction of activities and socialization.    MDD:  Feeling better,  Still taking 1/2 sertraline 50 mg daily.  Work load has improved which may be helping.  LBP:  Gabapentin too sedating for daytime .   Left leg numb but not weak.  Sciatica aggravated by walking.  Ortho appt for [facility] Dec 1 Discussed adding meloxicam and maxing out tylenol       Outpatient Medications Prior to Visit  Medication Sig Dispense Refill  . atorvastatin (LIPITOR) 20 MG tablet TAKE 1 TABLET DAILY 90 tablet 3  . Coenzyme Q10 (CO Q-10) 100 MG CAPS Take 1 capsule by mouth daily.    Marland Kitchen gabapentin (NEURONTIN) 300 MG capsule Take 1 capsule (300 mg total) by mouth 3 (three) times daily. 90 capsule 0  . LACTOBACILLUS PO Take 1 capsule by mouth daily.    . Multiple Vitamin (MULTI-VITAMIN) tablet Take by mouth.    . Omega-3 Fatty Acids (FISH OIL) 1000 MG CAPS Take by mouth.    Marland Kitchen omeprazole (PRILOSEC) 40 MG capsule TAKE 1 CAPSULE DAILY 90 capsule 3  . sertraline (ZOLOFT)  50 MG tablet Take 1 tablet (50 mg total) by mouth daily. 90 tablet 1  . sildenafil (VIAGRA) 100 MG tablet Take 1 tablet (100 mg total) by mouth daily as needed for erectile dysfunction. 18 tablet 3  . tiZANidine (ZANAFLEX) 4 MG tablet Take 1 tablet (4 mg total) by mouth every 6 (six) hours as needed for muscle spasms. 90 tablet 0  . valsartan-hydrochlorothiazide (DIOVAN-HCT) 80-12.5 MG tablet TAKE 1 TABLET DAILY 90 tablet 3  . aspirin 81 MG tablet Take 81 mg by mouth daily.  (Patient not taking: Reported on 10/22/2020)     Facility-Administered Medications Prior to Visit  Medication Dose Route Frequency Provider Last Rate Last Admin  . testosterone cypionate (DEPOTESTOSTERONE CYPIONATE) injection 100 mg  100 mg Intramuscular Q14 Days Crecencio Mc, MD   100 mg at 09/12/20 9379    Review of Systems;  Patient denies headache, fevers, malaise, unintentional weight loss, skin rash, eye pain, sinus congestion and sinus pain, sore throat, dysphagia,  hemoptysis , cough, dyspnea, wheezing, chest pain, palpitations, orthopnea, edema, abdominal pain, nausea, melena, diarrhea, constipation, flank pain, dysuria, hematuria, urinary  Frequency, nocturia, numbness, tingling, seizures,  Focal weakness, Loss of consciousness,  Tremor, insomnia, depression, anxiety, and suicidal ideation.      Objective:  BP 130/70 (BP Location: Left Arm, Patient Position: Sitting, Cuff Size: Large)   Pulse 77   Temp 98.4 F (36.9 C) (Oral)   Resp  15   Ht 5\' 9"  (1.753 m)   Wt 243 lb 9.6 oz (110.5 kg)   SpO2 95%   BMI 35.97 kg/m   BP Readings from Last 3 Encounters:  10/22/20 130/70  09/21/20 116/68  09/02/19 130/76    Wt Readings from Last 3 Encounters:  10/22/20 243 lb 9.6 oz (110.5 kg)  09/21/20 241 lb 9.6 oz (109.6 kg)  09/17/20 225 lb (102.1 kg)    General appearance: alert, cooperative and appears stated age Back: symmetric, no curvature. ROM normal. No CVA tenderness. Lungs: clear to auscultation  bilaterally Heart: regular rate and rhythm, S1, S2 normal, no murmur, click, rub or gallop Abdomen: soft, non-tender; bowel sounds normal; no masses,  no organomegaly Pulses: 2+ and symmetric Skin: Skin color, texture, turgor normal. No rashes or lesions Lymph nodes: Cervical, supraclavicular, and axillary nodes normal. Psych: affect normal, makes good eye contact. No fidgeting,  Smiles easily.  Denies suicidal thoughts   Lab Results  Component Value Date   HGBA1C 6.1 (H) 09/02/2019   HGBA1C 6.0 07/22/2018    Lab Results  Component Value Date   CREATININE 1.02 09/21/2020   CREATININE 1.20 01/26/2020   CREATININE 0.98 09/02/2019    Lab Results  Component Value Date   WBC 6.0 09/21/2020   HGB 15.6 09/21/2020   HCT 46.9 09/21/2020   PLT 174.0 09/21/2020   GLUCOSE 109 (H) 09/21/2020   CHOL 174 09/21/2020   TRIG 341.0 (H) 09/21/2020   HDL 41.30 09/21/2020   LDLDIRECT 101.0 09/21/2020   LDLCALC 182 (H) 09/02/2019   ALT 29 09/21/2020   AST 17 09/21/2020   NA 139 09/21/2020   K 4.2 09/21/2020   CL 104 09/21/2020   CREATININE 1.02 09/21/2020   BUN 17 09/21/2020   CO2 27 09/21/2020   TSH 1.48 09/21/2020   PSA 5.62 (H) 02/02/2019   HGBA1C 6.1 (H) 09/02/2019    CT LUMBAR SPINE WO CONTRAST  Result Date: 09/13/2020 CLINICAL DATA:  Intervertebral disc degeneration lumbar region.Chronic low back pain. Spinal cord stimulator. EXAM: CT LUMBAR SPINE WITHOUT CONTRAST TECHNIQUE: Multidetector CT imaging of the lumbar spine was performed without intravenous contrast administration. Multiplanar CT image reconstructions were also generated. COMPARISON:  Lumbar MRI 04/19/2014 FINDINGS: Segmentation: L5 partially incorporated into the sacrum. Vertebral body numbering consistent with the prior study. Alignment: Slight retrolisthesis T12-L1, L1-2. 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 Paraspinal and other soft  tissues: Negative for paraspinous mass or adenopathy. Abdominal aorta normal in caliber. Spinal cord stimulator enters the spinal canal at the T11-12 level. Disc levels: T11-12: Negative for stenosis T12-L1: Mild retrolisthesis. Mild disc bulging and mild facet degeneration without significant stenosis L1-2: Mild retrolisthesis. Moderate facet degeneration causing mild subarticular stenosis bilaterally. Spinal canal normal in size. L2-3: Disc degeneration with prominent endplate spurring diffusely and moderate to advanced facet hypertrophy bilaterally. Moderate spinal stenosis and moderate subarticular stenosis bilaterally unchanged. L3-4: Posterior laminectomy. Solid interbody fusion which has developed since the prior study. Correlate with surgical history. Bilateral facet hypertrophy. No significant spinal or foraminal stenosis. L4-5: Disc degeneration with diffuse endplate spurring. Moderate to severe facet degeneration bilaterally. Mild subarticular stenosis bilaterally. Spinal canal adequate in size. L5-S1: Solid interbody bony fusion. This could be degenerative or postsurgical. 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 No significant spinal or foraminal stenosis. IMPRESSION: Moderate spinal stenosis L2-3 with moderate subarticular stenosis  bilaterally unchanged from prior MRI Laminectomy L3-4 with solid interbody fusion. No significant stenosis Mild subarticular stenosis bilaterally L4-5 L5 left transverse process articulates with the sacrum with degenerative change in bony overgrowth. Solid interbody fusion at L5-S1. 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 Electronically Signed   By: Franchot Gallo M.D.   On: 09/13/2020 09:22   MR LUMBAR SPINE WO CONTRAST  Result Date: 09/13/2020 CLINICAL DATA:  Spinal stenosis of lumbar region with neurogenic claudication. EXAM: MRI LUMBAR SPINE WITHOUT CONTRAST TECHNIQUE:  Multiplanar, multisequence MR imaging of the lumbar spine was performed. No intravenous contrast was administered. COMPARISON:  09/13/2020 CT lumbar spine. 04/19/2014 MRI lumbar spine. 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 S1 butterfly vertebra with midline cleft. Alignment:  Minimal grade 1 L1-2 and L4-5 retrolisthesis. Vertebrae: Mild bone marrow heterogeneity. No focal osseous lesion. Degenerative related edema underlying the L1-2 endplates. Partial fusion of the L3-4 vertebral bodies. Conus medullaris and cauda equina: Conus extends to the T12 level. Conus and cauda equina appear normal. Disc levels: Multilevel desiccation and disc space loss. L1-2: Disc bulge with central protrusion, ligamentum flavum and bilateral facet hypertrophy. Mild spinal canal, moderate left and severe right neural foraminal narrowing, progressed since prior exam. L2-3: Disc bulge with ligamentum flavum and bilateral facet hypertrophy. Moderate spinal canal and severe bilateral neural foraminal narrowing, grossly unchanged. L3-4: Sequela of prior laminectomy. Partial fusion of the vertebral bodies. No significant disc bulge. Patent spinal canal. Moderate right and mild left neural foraminal narrowing. L4-5: Sequela of prior laminectomy. Disc bulge with superimposed central protrusion. Bilateral facet hypertrophy. There is abutment of the right greater than left descending L5 nerve roots. Minimal spinal canal, mild left and moderate right neural foraminal narrowing, unchanged. L5-S1: Right predominant disc bulge and facet hypertrophy with mild right neural foraminal narrowing, unchanged. Patent spinal canal and left neural foramen. Patent spinal canal. Paraspinal and other soft tissues: Indwelling neurostimulator. IMPRESSION: Multilevel spondylosis, progressed since prior exam. Moderate L2-3 and mild L1-2 spinal canal narrowing. Moderate to severe bilateral L1-3, right L3-5 neural  foraminal narrowing. 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 Electronically Signed   By: Primitivo Gauze M.D.   On: 09/13/2020 09:52    Assessment & Plan:   Problem List Items Addressed This Visit      Unprioritized   Major depressive disorder with single episode, in partial remission ([place])    Symptoms have improved with lessened work load and 25 mg sertraline.   He denies any manic symptoms or insomnia. Follow up 3 months       L-S radiculopathy    He has constant pain and numbness of the leg leg.  Adding meloxicam and tylenol to current gabapentin use, and await results of ESI on Dec 1          I have discontinued Bobby Rich "Dick"'s aspirin. I am also having him start on meloxicam and Zoster Vaccine Adjuvanted. Additionally, I am having him maintain his Co Q-10, LACTOBACILLUS PO, sildenafil, Fish Oil, omeprazole, Multi-Vitamin, atorvastatin, valsartan-hydrochlorothiazide, tiZANidine, sertraline, and gabapentin. We will continue to administer testosterone cypionate.  Meds ordered this encounter  Medications  . meloxicam (MOBIC) 15 MG tablet    Sig: Take 1 tablet (15 mg total) by mouth daily.    Dispense:  30 tablet    Refill:  0  . Zoster Vaccine Adjuvanted [facility]) injection    Sig: Inject 0.5 mLs into the muscle once for 1 dose.  Dispense:  1 each    Refill:  1    I provided  20 minutes of  face-to-face time during this encounter reviewing patient's current problems, providing counseling on the above mentioned problems , and coordination  of care . Medications Discontinued During This Encounter  Medication Reason  . aspirin 81 MG tablet     Follow-up: Return in about 6 months (around 04/21/2021).   Crecencio Mc, MD

## 2020-10-22 NOTE — Patient Instructions (Signed)
For your back pain:  Adding meloxicam 15 mg ONCE DAILY and 1000 mg tylenol TWICE DAILY     Continue omeprazole ,  Your PPI to prevent gastritis    Continue current dose of sertraline.  You may increase dose to full tablet if needed for anxiety or worsening depression    The ShingRx vaccine is now available in local pharmacies and is much more protective than Zostavaxs,  It is therefore ADVISED for all interested adults over 50 to prevent shingles.   If your pharmacy is not going to give it until the [place],  You can go to the [facility] [phone] located  In the [place] building,  On the bottom floor (in thea back of the building)

## 2020-10-23 NOTE — Assessment & Plan Note (Signed)
He has constant pain and numbness of the leg leg.  Adding meloxicam and tylenol to current gabapentin use, and await results of ESI on Dec 1

## 2020-10-23 NOTE — Assessment & Plan Note (Addendum)
Symptoms have improved with lessened work load and 25 mg sertraline.   He denies any manic symptoms or insomnia. Follow up 3 months

## 2020-10-24 ENCOUNTER — Ambulatory Visit: Payer: Medicare Other

## 2020-10-24 DIAGNOSIS — M79662 Pain in left lower leg: Secondary | ICD-10-CM | POA: Diagnosis not present

## 2020-10-24 DIAGNOSIS — M545 Low back pain, unspecified: Secondary | ICD-10-CM | POA: Diagnosis not present

## 2020-10-24 DIAGNOSIS — R200 Anesthesia of skin: Secondary | ICD-10-CM | POA: Diagnosis not present

## 2020-11-07 ENCOUNTER — Ambulatory Visit (INDEPENDENT_AMBULATORY_CARE_PROVIDER_SITE_OTHER): Payer: Medicare Other

## 2020-11-07 ENCOUNTER — Other Ambulatory Visit: Payer: Self-pay

## 2020-11-07 ENCOUNTER — Other Ambulatory Visit: Payer: Self-pay | Admitting: Internal Medicine

## 2020-11-07 DIAGNOSIS — R7989 Other specified abnormal findings of blood chemistry: Secondary | ICD-10-CM | POA: Diagnosis not present

## 2020-11-07 MED ORDER — TESTOSTERONE CYPIONATE 100 MG/ML IM SOLN
100.0000 mg | Freq: Once | INTRAMUSCULAR | Status: AC
  Administered 2020-11-07: 100 mg via INTRAMUSCULAR

## 2020-11-07 NOTE — Progress Notes (Signed)
Patient presented for Testerone injection to right glute, patient voiced no concerns nor showed any signs of distress during injection.

## 2020-11-09 ENCOUNTER — Other Ambulatory Visit: Payer: Self-pay

## 2020-11-09 DIAGNOSIS — B0229 Other postherpetic nervous system involvement: Secondary | ICD-10-CM

## 2020-11-09 MED ORDER — GABAPENTIN 300 MG PO CAPS: 300.0000 mg | ORAL_CAPSULE | Freq: Three times a day (TID) | ORAL | 1 refills | Status: DC

## 2020-11-13 DIAGNOSIS — R972 Elevated prostate specific antigen [PSA]: Secondary | ICD-10-CM | POA: Diagnosis not present

## 2020-11-13 DIAGNOSIS — E291 Testicular hypofunction: Secondary | ICD-10-CM | POA: Diagnosis not present

## 2020-11-13 DIAGNOSIS — N5203 Combined arterial insufficiency and corporo-venous occlusive erectile dysfunction: Secondary | ICD-10-CM | POA: Diagnosis not present

## 2020-11-13 MED ORDER — OMEPRAZOLE 40 MG PO CPDR: 40.0000 mg | DELAYED_RELEASE_CAPSULE | Freq: Every day | ORAL | 1 refills | Status: DC

## 2020-11-21 ENCOUNTER — Ambulatory Visit: Payer: Medicare Other

## 2020-12-05 ENCOUNTER — Ambulatory Visit (INDEPENDENT_AMBULATORY_CARE_PROVIDER_SITE_OTHER): Payer: Medicare Other

## 2020-12-05 ENCOUNTER — Other Ambulatory Visit: Payer: Self-pay

## 2020-12-05 DIAGNOSIS — R7989 Other specified abnormal findings of blood chemistry: Secondary | ICD-10-CM | POA: Diagnosis not present

## 2020-12-05 MED ORDER — TESTOSTERONE CYPIONATE 200 MG/ML IM SOLN
100.0000 mg | INTRAMUSCULAR | Status: AC
  Administered 2020-12-05 – 2021-04-25 (×4): 100 mg via INTRAMUSCULAR

## 2020-12-05 NOTE — Progress Notes (Signed)
Patient came in today for testosterone injection in left gluteal IM. Pt tolerated will with no signs of distress.

## 2020-12-11 DIAGNOSIS — M47816 Spondylosis without myelopathy or radiculopathy, lumbar region: Secondary | ICD-10-CM | POA: Diagnosis not present

## 2020-12-11 DIAGNOSIS — M4626 Osteomyelitis of vertebra, lumbar region: Secondary | ICD-10-CM | POA: Diagnosis not present

## 2020-12-11 DIAGNOSIS — M549 Dorsalgia, unspecified: Secondary | ICD-10-CM | POA: Diagnosis not present

## 2020-12-18 ENCOUNTER — Telehealth: Payer: Self-pay

## 2020-12-18 NOTE — Telephone Encounter (Signed)
lvm appt needs to be rescheduled.

## 2020-12-19 ENCOUNTER — Other Ambulatory Visit: Payer: Self-pay

## 2020-12-19 ENCOUNTER — Ambulatory Visit: Payer: Medicare Other

## 2020-12-19 ENCOUNTER — Ambulatory Visit (INDEPENDENT_AMBULATORY_CARE_PROVIDER_SITE_OTHER): Payer: Medicare Other

## 2020-12-19 DIAGNOSIS — R7989 Other specified abnormal findings of blood chemistry: Secondary | ICD-10-CM

## 2020-12-19 NOTE — Progress Notes (Signed)
Patient presented for testosterone injection to right glute, patient voiced no concerns nor showed any signs of distress during injection. 

## 2020-12-20 DIAGNOSIS — F32A Depression, unspecified: Secondary | ICD-10-CM | POA: Diagnosis not present

## 2020-12-20 DIAGNOSIS — N401 Enlarged prostate with lower urinary tract symptoms: Secondary | ICD-10-CM | POA: Diagnosis not present

## 2020-12-20 DIAGNOSIS — Z79899 Other long term (current) drug therapy: Secondary | ICD-10-CM | POA: Diagnosis not present

## 2020-12-20 DIAGNOSIS — K219 Gastro-esophageal reflux disease without esophagitis: Secondary | ICD-10-CM | POA: Diagnosis not present

## 2020-12-20 DIAGNOSIS — Z01818 Encounter for other preprocedural examination: Secondary | ICD-10-CM | POA: Diagnosis not present

## 2020-12-20 DIAGNOSIS — M961 Postlaminectomy syndrome, not elsewhere classified: Secondary | ICD-10-CM | POA: Diagnosis not present

## 2020-12-20 DIAGNOSIS — G894 Chronic pain syndrome: Secondary | ICD-10-CM | POA: Diagnosis not present

## 2020-12-20 DIAGNOSIS — E785 Hyperlipidemia, unspecified: Secondary | ICD-10-CM | POA: Diagnosis not present

## 2020-12-20 DIAGNOSIS — I119 Hypertensive heart disease without heart failure: Secondary | ICD-10-CM | POA: Diagnosis not present

## 2020-12-20 DIAGNOSIS — Z6836 Body mass index (BMI) 36.0-36.9, adult: Secondary | ICD-10-CM | POA: Diagnosis not present

## 2020-12-20 DIAGNOSIS — R7303 Prediabetes: Secondary | ICD-10-CM | POA: Diagnosis not present

## 2020-12-20 DIAGNOSIS — G4733 Obstructive sleep apnea (adult) (pediatric): Secondary | ICD-10-CM | POA: Diagnosis not present

## 2020-12-20 DIAGNOSIS — I4510 Unspecified right bundle-branch block: Secondary | ICD-10-CM | POA: Diagnosis not present

## 2020-12-20 DIAGNOSIS — I10 Essential (primary) hypertension: Secondary | ICD-10-CM | POA: Diagnosis not present

## 2020-12-20 DIAGNOSIS — Z9682 Presence of neurostimulator: Secondary | ICD-10-CM | POA: Diagnosis not present

## 2020-12-20 DIAGNOSIS — M4030 Flatback syndrome, site unspecified: Secondary | ICD-10-CM | POA: Diagnosis not present

## 2020-12-20 DIAGNOSIS — M48062 Spinal stenosis, lumbar region with neurogenic claudication: Secondary | ICD-10-CM | POA: Diagnosis not present

## 2020-12-20 DIAGNOSIS — M47816 Spondylosis without myelopathy or radiculopathy, lumbar region: Secondary | ICD-10-CM | POA: Diagnosis not present

## 2020-12-20 DIAGNOSIS — M532X6 Spinal instabilities, lumbar region: Secondary | ICD-10-CM | POA: Diagnosis not present

## 2021-01-02 ENCOUNTER — Ambulatory Visit: Payer: Medicare Other

## 2021-01-10 ENCOUNTER — Other Ambulatory Visit: Payer: Self-pay

## 2021-01-10 MED ORDER — SERTRALINE HCL 50 MG PO TABS: 50.0000 mg | ORAL_TABLET | Freq: Every day | ORAL | 1 refills | Status: DC

## 2021-01-14 ENCOUNTER — Telehealth: Payer: Self-pay | Admitting: Internal Medicine

## 2021-01-14 MED ORDER — SERTRALINE HCL 50 MG PO TABS: 50.0000 mg | ORAL_TABLET | Freq: Every day | ORAL | 0 refills | Status: DC

## 2021-01-14 NOTE — Telephone Encounter (Signed)
Pt Needs a short supply on the sertraline (ZOLOFT) 50 MG tablet sent to [facility]  He said that Mail-order told him that there is a delay on that medication and will take 10-14 days to come  Pt is completely out of medication

## 2021-01-14 NOTE — Telephone Encounter (Signed)
Spoke with pt to let him know that the 14 day supply was sent in to [facility].

## 2021-01-14 NOTE — Telephone Encounter (Signed)
Pt called to follow up on Refill

## 2021-01-16 ENCOUNTER — Ambulatory Visit (INDEPENDENT_AMBULATORY_CARE_PROVIDER_SITE_OTHER): Payer: Medicare Other | Admitting: *Deleted

## 2021-01-16 ENCOUNTER — Other Ambulatory Visit: Payer: Self-pay

## 2021-01-16 DIAGNOSIS — R7989 Other specified abnormal findings of blood chemistry: Secondary | ICD-10-CM

## 2021-01-16 MED ORDER — TESTOSTERONE CYPIONATE 100 MG/ML IM SOLN
100.0000 mg | Freq: Once | INTRAMUSCULAR | Status: AC
  Administered 2021-01-16: 100 mg via INTRAMUSCULAR

## 2021-01-16 NOTE — Progress Notes (Signed)
Patient presented fortestosteroneinjection to left upper outter quadrant, patient voiced no concerns nor showed any signs of distress during injection.

## 2021-01-17 DIAGNOSIS — Z8249 Family history of ischemic heart disease and other diseases of the circulatory system: Secondary | ICD-10-CM | POA: Diagnosis not present

## 2021-01-17 DIAGNOSIS — I10 Essential (primary) hypertension: Secondary | ICD-10-CM | POA: Diagnosis not present

## 2021-01-17 DIAGNOSIS — R9431 Abnormal electrocardiogram [ECG] [EKG]: Secondary | ICD-10-CM | POA: Diagnosis not present

## 2021-01-17 DIAGNOSIS — R0602 Shortness of breath: Secondary | ICD-10-CM | POA: Diagnosis not present

## 2021-01-17 DIAGNOSIS — E782 Mixed hyperlipidemia: Secondary | ICD-10-CM | POA: Diagnosis not present

## 2021-01-30 ENCOUNTER — Ambulatory Visit: Payer: Medicare Other

## 2021-02-13 ENCOUNTER — Ambulatory Visit (INDEPENDENT_AMBULATORY_CARE_PROVIDER_SITE_OTHER): Payer: Medicare Other

## 2021-02-13 ENCOUNTER — Other Ambulatory Visit: Payer: Self-pay

## 2021-02-13 DIAGNOSIS — E291 Testicular hypofunction: Secondary | ICD-10-CM

## 2021-02-13 DIAGNOSIS — R7989 Other specified abnormal findings of blood chemistry: Secondary | ICD-10-CM | POA: Diagnosis not present

## 2021-02-13 MED ORDER — TESTOSTERONE CYPIONATE 100 MG/ML IM SOLN
100.0000 mg | Freq: Once | INTRAMUSCULAR | Status: AC
  Administered 2021-02-13: 100 mg via INTRAMUSCULAR

## 2021-02-13 NOTE — Progress Notes (Signed)
Patient presented for Testosterone injection to right glute, patient voiced no concerns nor showed any signs of distress during injection. Next injection due

## 2021-02-26 DIAGNOSIS — M1712 Unilateral primary osteoarthritis, left knee: Secondary | ICD-10-CM | POA: Diagnosis not present

## 2021-02-27 ENCOUNTER — Ambulatory Visit (INDEPENDENT_AMBULATORY_CARE_PROVIDER_SITE_OTHER): Payer: Medicare Other

## 2021-02-27 ENCOUNTER — Other Ambulatory Visit: Payer: Self-pay

## 2021-02-27 DIAGNOSIS — E291 Testicular hypofunction: Secondary | ICD-10-CM | POA: Diagnosis not present

## 2021-02-27 DIAGNOSIS — R7989 Other specified abnormal findings of blood chemistry: Secondary | ICD-10-CM

## 2021-02-27 MED ORDER — TESTOSTERONE CYPIONATE 100 MG/ML IM SOLN
100.0000 mg | Freq: Once | INTRAMUSCULAR | Status: AC
Start: 2021-02-27 — End: 2021-02-27
  Administered 2021-02-27: 100 mg via INTRAMUSCULAR

## 2021-02-27 NOTE — Progress Notes (Signed)
Patient presented for Testosterone injection to left glute, patient voiced no concerns nor showed any signs of distress during injection.

## 2021-02-28 DIAGNOSIS — R7303 Prediabetes: Secondary | ICD-10-CM

## 2021-02-28 DIAGNOSIS — Z01812 Encounter for preprocedural laboratory examination: Secondary | ICD-10-CM

## 2021-02-28 DIAGNOSIS — M4820 Kissing spine, site unspecified: Secondary | ICD-10-CM | POA: Diagnosis not present

## 2021-02-28 DIAGNOSIS — M4030 Flatback syndrome, site unspecified: Secondary | ICD-10-CM | POA: Diagnosis not present

## 2021-02-28 DIAGNOSIS — M47816 Spondylosis without myelopathy or radiculopathy, lumbar region: Secondary | ICD-10-CM | POA: Diagnosis not present

## 2021-02-28 DIAGNOSIS — I10 Essential (primary) hypertension: Secondary | ICD-10-CM

## 2021-02-28 DIAGNOSIS — R5383 Other fatigue: Secondary | ICD-10-CM

## 2021-03-01 DIAGNOSIS — G5602 Carpal tunnel syndrome, left upper limb: Secondary | ICD-10-CM | POA: Diagnosis not present

## 2021-03-05 ENCOUNTER — Other Ambulatory Visit: Payer: Self-pay

## 2021-03-05 ENCOUNTER — Other Ambulatory Visit (INDEPENDENT_AMBULATORY_CARE_PROVIDER_SITE_OTHER): Payer: Medicare Other

## 2021-03-05 DIAGNOSIS — I10 Essential (primary) hypertension: Secondary | ICD-10-CM | POA: Diagnosis not present

## 2021-03-05 DIAGNOSIS — Z01812 Encounter for preprocedural laboratory examination: Secondary | ICD-10-CM | POA: Diagnosis not present

## 2021-03-05 DIAGNOSIS — R7303 Prediabetes: Secondary | ICD-10-CM | POA: Diagnosis not present

## 2021-03-05 DIAGNOSIS — R5383 Other fatigue: Secondary | ICD-10-CM | POA: Diagnosis not present

## 2021-03-05 LAB — CBC WITH DIFFERENTIAL/PLATELET
Basophils Absolute: 0 10*3/uL (ref 0.0–0.1)
Basophils Relative: 0.4 % (ref 0.0–3.0)
Eosinophils Absolute: 0.1 10*3/uL (ref 0.0–0.7)
Eosinophils Relative: 1 % (ref 0.0–5.0)
HCT: 44.5 % (ref 39.0–52.0)
Hemoglobin: 14.7 g/dL (ref 13.0–17.0)
Lymphocytes Relative: 29.4 % (ref 12.0–46.0)
Lymphs Abs: 2.1 10*3/uL (ref 0.7–4.0)
MCHC: 33.1 g/dL (ref 30.0–36.0)
MCV: 88.4 fl (ref 78.0–100.0)
Monocytes Absolute: 0.4 10*3/uL (ref 0.1–1.0)
Monocytes Relative: 6.3 % (ref 3.0–12.0)
Neutro Abs: 4.4 10*3/uL (ref 1.4–7.7)
Neutrophils Relative %: 62.9 % (ref 43.0–77.0)
Platelets: 179 10*3/uL (ref 150.0–400.0)
RBC: 5.04 Mil/uL (ref 4.22–5.81)
RDW: 14 % (ref 11.5–15.5)
WBC: 7 10*3/uL (ref 4.0–10.5)

## 2021-03-05 LAB — BASIC METABOLIC PANEL
BUN: 19 mg/dL (ref 6–23)
CO2: 30 mEq/L (ref 19–32)
Calcium: 9.6 mg/dL (ref 8.4–10.5)
Chloride: 102 mEq/L (ref 96–112)
Creatinine, Ser: 1.2 mg/dL (ref 0.40–1.50)
GFR: 62.45 mL/min (ref >60.00)
Glucose, Bld: 97 mg/dL (ref 70–99)
Potassium: 4.6 mEq/L (ref 3.5–5.1)
Sodium: 140 mEq/L (ref 135–145)

## 2021-03-05 LAB — HEMOGLOBIN A1C: Hgb A1c MFr Bld: 6 % (ref 4.6–6.5)

## 2021-03-05 NOTE — Telephone Encounter (Signed)
No orders found. Please place future orders prior to patient appt thi morning.

## 2021-03-05 NOTE — Telephone Encounter (Signed)
Lab orders have been placed

## 2021-03-08 ENCOUNTER — Other Ambulatory Visit: Payer: Self-pay | Admitting: Internal Medicine

## 2021-03-08 DIAGNOSIS — R0602 Shortness of breath: Secondary | ICD-10-CM

## 2021-03-08 DIAGNOSIS — R9431 Abnormal electrocardiogram [ECG] [EKG]: Secondary | ICD-10-CM

## 2021-03-12 DIAGNOSIS — R0602 Shortness of breath: Secondary | ICD-10-CM | POA: Diagnosis not present

## 2021-03-12 DIAGNOSIS — R9431 Abnormal electrocardiogram [ECG] [EKG]: Secondary | ICD-10-CM | POA: Diagnosis not present

## 2021-03-13 ENCOUNTER — Ambulatory Visit: Payer: Medicare Other

## 2021-03-13 DIAGNOSIS — E782 Mixed hyperlipidemia: Secondary | ICD-10-CM | POA: Diagnosis not present

## 2021-03-13 DIAGNOSIS — I10 Essential (primary) hypertension: Secondary | ICD-10-CM | POA: Diagnosis not present

## 2021-03-13 DIAGNOSIS — G4733 Obstructive sleep apnea (adult) (pediatric): Secondary | ICD-10-CM | POA: Diagnosis not present

## 2021-03-13 DIAGNOSIS — R9431 Abnormal electrocardiogram [ECG] [EKG]: Secondary | ICD-10-CM | POA: Diagnosis not present

## 2021-03-15 DIAGNOSIS — I10 Essential (primary) hypertension: Secondary | ICD-10-CM | POA: Diagnosis present

## 2021-03-15 DIAGNOSIS — Z7982 Long term (current) use of aspirin: Secondary | ICD-10-CM | POA: Diagnosis not present

## 2021-03-15 DIAGNOSIS — N401 Enlarged prostate with lower urinary tract symptoms: Secondary | ICD-10-CM | POA: Diagnosis present

## 2021-03-15 DIAGNOSIS — Z20822 Contact with and (suspected) exposure to covid-19: Secondary | ICD-10-CM | POA: Diagnosis present

## 2021-03-15 DIAGNOSIS — E785 Hyperlipidemia, unspecified: Secondary | ICD-10-CM | POA: Diagnosis present

## 2021-03-15 DIAGNOSIS — M4726 Other spondylosis with radiculopathy, lumbar region: Secondary | ICD-10-CM | POA: Diagnosis present

## 2021-03-15 DIAGNOSIS — M4646 Discitis, unspecified, lumbar region: Secondary | ICD-10-CM | POA: Diagnosis present

## 2021-03-15 DIAGNOSIS — Z981 Arthrodesis status: Secondary | ICD-10-CM | POA: Diagnosis not present

## 2021-03-15 DIAGNOSIS — M48061 Spinal stenosis, lumbar region without neurogenic claudication: Secondary | ICD-10-CM | POA: Diagnosis present

## 2021-03-15 DIAGNOSIS — M4036 Flatback syndrome, lumbar region: Secondary | ICD-10-CM | POA: Diagnosis not present

## 2021-03-15 DIAGNOSIS — M4626 Osteomyelitis of vertebra, lumbar region: Secondary | ICD-10-CM | POA: Diagnosis present

## 2021-03-15 DIAGNOSIS — M47816 Spondylosis without myelopathy or radiculopathy, lumbar region: Secondary | ICD-10-CM | POA: Diagnosis not present

## 2021-03-15 DIAGNOSIS — F32A Depression, unspecified: Secondary | ICD-10-CM | POA: Diagnosis present

## 2021-03-15 DIAGNOSIS — M5417 Radiculopathy, lumbosacral region: Secondary | ICD-10-CM | POA: Diagnosis not present

## 2021-03-15 DIAGNOSIS — K219 Gastro-esophageal reflux disease without esophagitis: Secondary | ICD-10-CM | POA: Diagnosis present

## 2021-03-27 ENCOUNTER — Other Ambulatory Visit: Payer: Self-pay

## 2021-03-27 ENCOUNTER — Ambulatory Visit (INDEPENDENT_AMBULATORY_CARE_PROVIDER_SITE_OTHER): Payer: Medicare Other

## 2021-03-27 DIAGNOSIS — E291 Testicular hypofunction: Secondary | ICD-10-CM | POA: Diagnosis not present

## 2021-03-27 NOTE — Progress Notes (Signed)
Patient presented for Testosterone injection to left glute, patient voiced no concerns nor showed any signs of distress during injection.  

## 2021-04-03 DIAGNOSIS — Z4889 Encounter for other specified surgical aftercare: Secondary | ICD-10-CM | POA: Diagnosis not present

## 2021-04-03 DIAGNOSIS — Z4802 Encounter for removal of sutures: Secondary | ICD-10-CM | POA: Diagnosis not present

## 2021-04-10 ENCOUNTER — Telehealth: Payer: Self-pay

## 2021-04-10 ENCOUNTER — Ambulatory Visit: Payer: Medicare Other

## 2021-04-10 NOTE — Telephone Encounter (Signed)
Spoken to patient. Instructed him we did not have his injection medication.

## 2021-04-10 NOTE — Telephone Encounter (Signed)
Pt called to cancel his appt because he can not find his testosterone medication. He wants to know if he left it here last time he had an appt. He canceled for today and would like a call back when you get in.

## 2021-04-24 ENCOUNTER — Ambulatory Visit: Payer: Medicare Other

## 2021-04-25 ENCOUNTER — Other Ambulatory Visit: Payer: Self-pay

## 2021-04-25 ENCOUNTER — Ambulatory Visit (INDEPENDENT_AMBULATORY_CARE_PROVIDER_SITE_OTHER): Payer: Medicare Other

## 2021-04-25 ENCOUNTER — Telehealth: Payer: Self-pay

## 2021-04-25 DIAGNOSIS — M47816 Spondylosis without myelopathy or radiculopathy, lumbar region: Secondary | ICD-10-CM | POA: Diagnosis not present

## 2021-04-25 DIAGNOSIS — M4316 Spondylolisthesis, lumbar region: Secondary | ICD-10-CM | POA: Diagnosis not present

## 2021-04-25 DIAGNOSIS — E291 Testicular hypofunction: Secondary | ICD-10-CM | POA: Diagnosis not present

## 2021-04-25 DIAGNOSIS — Z981 Arthrodesis status: Secondary | ICD-10-CM | POA: Diagnosis not present

## 2021-04-25 NOTE — Telephone Encounter (Signed)
Left message to call back for [facility] number from Patient supplied medication. Medication on Box and Bottle were different.

## 2021-04-25 NOTE — Progress Notes (Signed)
Patient presented today for a Testosterone injection into the left ventrogluteal muscle.  Patient voiced no concerns or discomfort to injection.

## 2021-04-25 NOTE — Telephone Encounter (Signed)
Patient was returning call about [place] number

## 2021-04-26 NOTE — Telephone Encounter (Signed)
Called and spoke to [place]. Bobby Rich states that he is at work but will send a [employer] with the [place] numbers on both the bottle and the Box with the instructions to [place].

## 2021-04-26 NOTE — Telephone Encounter (Signed)
Pt called back returning your call °

## 2021-04-26 NOTE — Telephone Encounter (Signed)
Returned the patients call. Left a voice message to call back.

## 2021-05-07 DIAGNOSIS — D2261 Melanocytic nevi of right upper limb, including shoulder: Secondary | ICD-10-CM | POA: Diagnosis not present

## 2021-05-07 DIAGNOSIS — L821 Other seborrheic keratosis: Secondary | ICD-10-CM | POA: Diagnosis not present

## 2021-05-07 DIAGNOSIS — D2272 Melanocytic nevi of left lower limb, including hip: Secondary | ICD-10-CM | POA: Diagnosis not present

## 2021-05-07 DIAGNOSIS — L570 Actinic keratosis: Secondary | ICD-10-CM | POA: Diagnosis not present

## 2021-05-07 DIAGNOSIS — D485 Neoplasm of uncertain behavior of skin: Secondary | ICD-10-CM | POA: Diagnosis not present

## 2021-05-07 DIAGNOSIS — D2271 Melanocytic nevi of right lower limb, including hip: Secondary | ICD-10-CM | POA: Diagnosis not present

## 2021-05-07 DIAGNOSIS — D225 Melanocytic nevi of trunk: Secondary | ICD-10-CM | POA: Diagnosis not present

## 2021-05-07 DIAGNOSIS — D2262 Melanocytic nevi of left upper limb, including shoulder: Secondary | ICD-10-CM | POA: Diagnosis not present

## 2021-05-07 DIAGNOSIS — C44719 Basal cell carcinoma of skin of left lower limb, including hip: Secondary | ICD-10-CM | POA: Diagnosis not present

## 2021-05-07 DIAGNOSIS — X32XXXA Exposure to sunlight, initial encounter: Secondary | ICD-10-CM | POA: Diagnosis not present

## 2021-05-08 ENCOUNTER — Ambulatory Visit (INDEPENDENT_AMBULATORY_CARE_PROVIDER_SITE_OTHER): Payer: Medicare Other

## 2021-05-08 ENCOUNTER — Other Ambulatory Visit: Payer: Self-pay

## 2021-05-08 DIAGNOSIS — E291 Testicular hypofunction: Secondary | ICD-10-CM

## 2021-05-08 MED ORDER — TESTOSTERONE CYPIONATE 100 MG/ML IM SOLN
100.0000 mg | INTRAMUSCULAR | Status: AC
  Administered 2021-05-08: 100 mg via INTRAMUSCULAR

## 2021-05-08 NOTE — Progress Notes (Signed)
Patient presented for testosterone injection  to right glute. Patient tolerated well.

## 2021-05-09 MED ORDER — SILDENAFIL CITRATE 100 MG PO TABS: 100.0000 mg | ORAL_TABLET | Freq: Every day | ORAL | 3 refills | Status: AC | PRN

## 2021-05-16 DIAGNOSIS — N401 Enlarged prostate with lower urinary tract symptoms: Secondary | ICD-10-CM | POA: Diagnosis not present

## 2021-05-16 DIAGNOSIS — R351 Nocturia: Secondary | ICD-10-CM | POA: Diagnosis not present

## 2021-05-16 DIAGNOSIS — R339 Retention of urine, unspecified: Secondary | ICD-10-CM | POA: Diagnosis not present

## 2021-05-16 DIAGNOSIS — N5203 Combined arterial insufficiency and corporo-venous occlusive erectile dysfunction: Secondary | ICD-10-CM | POA: Diagnosis not present

## 2021-05-16 DIAGNOSIS — Z79899 Other long term (current) drug therapy: Secondary | ICD-10-CM | POA: Diagnosis not present

## 2021-05-16 DIAGNOSIS — R972 Elevated prostate specific antigen [PSA]: Secondary | ICD-10-CM | POA: Diagnosis not present

## 2021-05-16 DIAGNOSIS — R3912 Poor urinary stream: Secondary | ICD-10-CM | POA: Diagnosis not present

## 2021-05-22 ENCOUNTER — Other Ambulatory Visit: Payer: Self-pay

## 2021-05-22 ENCOUNTER — Ambulatory Visit (INDEPENDENT_AMBULATORY_CARE_PROVIDER_SITE_OTHER): Payer: Medicare Other

## 2021-05-22 DIAGNOSIS — E291 Testicular hypofunction: Secondary | ICD-10-CM | POA: Diagnosis not present

## 2021-05-22 MED ORDER — TESTOSTERONE CYPIONATE 100 MG/ML IM SOLN
100.0000 mg | INTRAMUSCULAR | Status: AC
  Administered 2021-05-22 – 2021-08-14 (×2): 100 mg via INTRAMUSCULAR

## 2021-05-22 NOTE — Progress Notes (Signed)
Patient presented for testosterone injection to left glute, patient voiced no concerns nor showed any signs of distress during injection.

## 2021-05-23 DIAGNOSIS — Z981 Arthrodesis status: Secondary | ICD-10-CM | POA: Diagnosis not present

## 2021-05-30 ENCOUNTER — Encounter: Payer: Self-pay | Admitting: Family Medicine

## 2021-05-30 ENCOUNTER — Telehealth (INDEPENDENT_AMBULATORY_CARE_PROVIDER_SITE_OTHER): Payer: Medicare Other | Admitting: Family Medicine

## 2021-05-30 DIAGNOSIS — U071 COVID-19: Secondary | ICD-10-CM | POA: Diagnosis not present

## 2021-05-30 MED ORDER — MOLNUPIRAVIR EUA 200MG CAPSULE: 4.0000 | ORAL_CAPSULE | Freq: Two times a day (BID) | ORAL | 0 refills | Status: AC

## 2021-05-30 NOTE — Patient Instructions (Signed)
Try tylenol for body aches, chills, sore throat.  Mucinex if needed for cough or congestion, saline nasal spray as needed for nasal congestion, Cepacol cough drops may also help with sore throat.  I sent the antiviral to your pharmacy.  If any increased confusion, inability to drink fluids, shortness of breath or other acute worsening be seen in urgent care or ER.  Hope you feel better soon!

## 2021-05-30 NOTE — Progress Notes (Signed)
Virtual Visit via Video Note  I connected with Bobby Rich on 05/30/21 at 9:38 AM by a video enabled telemedicine application and verified that I am speaking with the correct person using two identifiers.  Patient location:home My location: office - [place].    I discussed the limitations, risks, security and privacy concerns of performing an evaluation and management service by telephone and the availability of in person appointments. I also discussed with the patient that there may be a patient responsible charge related to this service. The patient expressed understanding and agreed to proceed, consent obtained  Chief complaint:  Chief Complaint  Patient presents with   Covid Positive    Symptoms started yesterday morning and tested positive last night Symptoms: chills, congestion, sore throat, headaches, dry cough   History of Present Illness: Bobby Rich is a 70 y.o. male  COVID-19 infection Initial symptoms started yesterday morning with chills, congestion, sore throat, headache, dry cough.  Positive rapid at home test last night. No dyspnea, able to drink fluids. Minimal covid brain, not disoriented.  Tx: vitamin D.  No known sick contacts.   Received primary COVID-vaccine in February and March 2021. Booster x1 late last year. No 2nd booster.  COVID risk and complication score of 4.  He is not on immunosuppressants.   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   Loss of libido 07/13/2016   Sleep apnea, obstructive 07/13/2016   Chronic pain syndrome 07/13/2016    Obesity (BMI 30.0-34.9) 06/30/2015   L-S radiculopathy 09/21/2014   HTN (hypertension) 02/14/2014   HZV (herpes zoster virus) post herpetic neuralgia 05/27/2013   Male erectile dysfunction 01/29/2013   GERD (gastroesophageal reflux disease)    Hypogonadism in male    Hyperlipidemia    Major depressive disorder with single episode, in partial remission ([place])    Past Medical History:  Diagnosis Date   Cancer ([place])    basel cell skin cancer   Depression    GERD (gastroesophageal reflux disease)    Hyperlipidemia    Hypertension    Low testosterone    Past Surgical History:  Procedure Laterality Date   COSMETIC SURGERY     HERNIA REPAIR Bilateral    with mesh    LUMBAR LAMINECTOMY  08/2016   SPINE SURGERY  2012   [place] L3 4 5    Allergies  Allergen Reactions   Other     Seasonal Allergies    Prior to Admission medications   Medication Sig Start Date End Date Taking? Authorizing Provider  atorvastatin (LIPITOR) 20 MG tablet TAKE 1 TABLET DAILY 11/07/20  Yes Crecencio Mc, MD  Coenzyme Q10 (CO Q-10) 100 MG CAPS Take 1 capsule by mouth daily.   Yes [provider]  gabapentin (NEURONTIN) 300 MG capsule Take 1 capsule (300 mg total) by mouth 3 (three) times daily. 11/09/20  Yes Crecencio Mc, MD  LACTOBACILLUS PO Take 1 capsule by mouth daily.   Yes [provider]  meloxicam (MOBIC) 15 MG tablet TAKE 1 TABLET DAILY 11/07/20  Yes Crecencio Mc, MD  Multiple Vitamin (MULTI-VITAMIN) tablet  Take by mouth.   Yes [provider]  Omega-3 Fatty Acids (FISH OIL) 1000 MG CAPS Take by mouth.   Yes [provider]  omeprazole (PRILOSEC) 40 MG capsule Take 1 capsule (40 mg total) by mouth daily. 11/13/20  Yes Crecencio Mc, MD  sertraline (ZOLOFT) 50 MG tablet Take 1 tablet (50 mg total) by mouth daily. 01/14/21  Yes Crecencio Mc, MD  sildenafil (VIAGRA) 100 MG tablet Take 1 tablet (100 mg total) by mouth daily as needed for erectile  dysfunction. 05/09/21  Yes Crecencio Mc, MD  tiZANidine (ZANAFLEX) 4 MG tablet Take 1 tablet (4 mg total) by mouth every 6 (six) hours as needed for muscle spasms. 09/21/20  Yes Crecencio Mc, MD  valsartan-hydrochlorothiazide (DIOVAN-HCT) 80-12.5 MG tablet TAKE 1 TABLET DAILY 08/09/20  Yes Crecencio Mc, MD   Social History   Socioeconomic History   Marital status: Single    Spouse name: Not on file   Number of children: Not on file   Years of education: Not on file   Highest education level: Not on file  Occupational History   Not on file  Tobacco Use   Smoking status: Never   Smokeless tobacco: Never  Substance and Sexual Activity   Alcohol use: Yes    Comment: social   Drug use: No   Sexual activity: Yes  Other Topics Concern   Not on file  Social History Narrative   Not on file   Social Determinants of Health   Financial Resource Strain: Not on file  Food Insecurity: No Food Insecurity   Worried About Running Out of Food in the Last Year: Never true   Ran Out of Food in the Last Year: Never true  Transportation Needs: No Transportation Needs   Lack of Transportation (Medical): No   Lack of Transportation (Non-Medical): No  Physical Activity: Not on file  Stress: Stress Concern Present   Feeling of Stress : To some extent  Social Connections: Not on file  Intimate Partner Violence: Not on file    Observations/Objective: There were no vitals filed for this visit. Nontoxic appearance on video, speaking full sentences, no respiratory distress.  Coherent responses.  No cough during exam.  All questions were answered with understanding of plan expressed.  Assessment and Plan: COVID-19 virus infection - Plan: molnupiravir EUA 200 mg CAPS Mild symptoms at this time, status post COVID vaccination and 1 booster.  Symptomatic care with Mucinex, Tylenol, Cepacol, saline nasal spray.  Discussed antivirals, chose molnupiravir.  Potential side effects/risk discussed.   Masking/isolation precautions discussed per CDC and urgent care/ER precautions.  Follow Up Instructions: Urgent care/ER precautions given.   I discussed the assessment and treatment plan with the patient. The patient was provided an opportunity to ask questions and all were answered. The patient agreed with the plan and demonstrated an understanding of the instructions.   The patient was advised to call back or seek an in-person evaluation if the symptoms worsen or if the condition fails to improve as anticipated.  I provided 18 minutes of non-face-to-face time during this encounter.   Wendie Agreste, MD

## 2021-06-05 ENCOUNTER — Ambulatory Visit: Payer: Medicare Other

## 2021-06-19 ENCOUNTER — Ambulatory Visit (INDEPENDENT_AMBULATORY_CARE_PROVIDER_SITE_OTHER): Payer: Medicare Other

## 2021-06-19 ENCOUNTER — Other Ambulatory Visit: Payer: Self-pay

## 2021-06-19 DIAGNOSIS — E291 Testicular hypofunction: Secondary | ICD-10-CM

## 2021-06-19 DIAGNOSIS — C44719 Basal cell carcinoma of skin of left lower limb, including hip: Secondary | ICD-10-CM | POA: Diagnosis not present

## 2021-06-19 MED ORDER — TESTOSTERONE CYPIONATE 100 MG/ML IM SOLN
100.0000 mg | Freq: Once | INTRAMUSCULAR | Status: AC
  Administered 2021-06-19: 100 mg via INTRAMUSCULAR

## 2021-06-19 NOTE — Progress Notes (Signed)
Pt presents for testosterone injection. Pt voiced no concern nor showed any signs of distress.

## 2021-06-24 ENCOUNTER — Other Ambulatory Visit: Payer: Self-pay | Admitting: Internal Medicine

## 2021-07-03 ENCOUNTER — Ambulatory Visit: Payer: Medicare Other

## 2021-07-09 ENCOUNTER — Telehealth: Payer: Self-pay | Admitting: Internal Medicine

## 2021-07-09 NOTE — Telephone Encounter (Signed)
Patient informed, Due to the high volume of calls and your symptoms we have to forward your call to our Triage Nurse to expedient your call. Please hold for the transfer.  Patient transferred to Access Nurse. Due to having COVID 4 weeks ago and having low energy and wants to be checked out.No openings in office or virtual.

## 2021-07-09 NOTE — Telephone Encounter (Signed)
Patient has appointment on 07-12-21.

## 2021-07-12 ENCOUNTER — Encounter: Payer: Self-pay | Admitting: Internal Medicine

## 2021-07-12 ENCOUNTER — Ambulatory Visit (INDEPENDENT_AMBULATORY_CARE_PROVIDER_SITE_OTHER): Payer: Medicare Other | Admitting: Internal Medicine

## 2021-07-12 ENCOUNTER — Other Ambulatory Visit: Payer: Self-pay

## 2021-07-12 VITALS — BP 126/74 | HR 95 | Temp 96.0°F | Resp 14 | Ht 69.0 in | Wt 234.4 lb

## 2021-07-12 DIAGNOSIS — E559 Vitamin D deficiency, unspecified: Secondary | ICD-10-CM | POA: Diagnosis not present

## 2021-07-12 DIAGNOSIS — R41840 Attention and concentration deficit: Secondary | ICD-10-CM

## 2021-07-12 DIAGNOSIS — E538 Deficiency of other specified B group vitamins: Secondary | ICD-10-CM | POA: Diagnosis not present

## 2021-07-12 DIAGNOSIS — U099 Post covid-19 condition, unspecified: Secondary | ICD-10-CM | POA: Diagnosis not present

## 2021-07-12 DIAGNOSIS — E782 Mixed hyperlipidemia: Secondary | ICD-10-CM | POA: Diagnosis not present

## 2021-07-12 DIAGNOSIS — R7303 Prediabetes: Secondary | ICD-10-CM | POA: Diagnosis not present

## 2021-07-12 DIAGNOSIS — M5417 Radiculopathy, lumbosacral region: Secondary | ICD-10-CM | POA: Diagnosis not present

## 2021-07-12 DIAGNOSIS — E291 Testicular hypofunction: Secondary | ICD-10-CM

## 2021-07-12 DIAGNOSIS — G9331 Postviral fatigue syndrome: Secondary | ICD-10-CM

## 2021-07-12 DIAGNOSIS — G933 Postviral fatigue syndrome: Secondary | ICD-10-CM | POA: Diagnosis not present

## 2021-07-12 MED ORDER — TESTOSTERONE CYPIONATE 100 MG/ML IM SOLN
100.0000 mg | Freq: Once | INTRAMUSCULAR | Status: AC
  Administered 2021-07-12: 100 mg via INTRAMUSCULAR

## 2021-07-12 NOTE — Patient Instructions (Signed)
Return next week for labs and urine studies  You had an a1c that was diagnostic of diabetes recently (6.5); we are rechecking this as well

## 2021-07-12 NOTE — Progress Notes (Signed)
Subjective:  Patient ID: Bobby Rich, male    DOB: 10-14-1951  Age: 70 y.o. MRN: [id number]  CC: The primary encounter diagnosis was Mixed hyperlipidemia. Diagnoses of Postviral fatigue syndrome, Vitamin D deficiency, B12 deficiency, Prediabetes, Hypogonadism in male, L-S radiculopathy, and Post-COVID chronic concentration deficit were also pertinent to this visit.  HPI Bobby Rich presents for  fatigue and malaise following COVID 19 infection on July 1 .This visit occurred during the SARS-CoV-2 public health emergency.  Safety protocols were in place, including screening questions prior to the visit, additional usage of staff PPE, and extensive cleaning of exam room while observing appropriate contact time as indicated for disinfecting solutions.    Had chills, fevers to 101.4 for 2 days, severe pharyngitis,  post nasal drip.  No respiratory symptoms.  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   Some concentration issues since then.   Missed last testosterone injection . Wants it today  History of back surgery in May for management of sciatica.  L4-5 ALIF and L2-3 TLIF  was done with resolution of sciatica but persistent LLE numbness and tingling affecting the anterior thigh and shin of the leg. Its nothing like the sciatic pain he had before. It has remained unchanged, but it is not painful.     Outpatient Medications Prior to Visit  Medication Sig Dispense Refill   atorvastatin (LIPITOR) 20 MG tablet TAKE 1 TABLET DAILY 90 tablet 3   Coenzyme Q10 (CO Q-10) 100 MG CAPS Take 1 capsule by mouth daily.     gabapentin (NEURONTIN) 300 MG capsule Take 1 capsule (300 mg total) by mouth 3 (three) times daily. 270 capsule 1   LACTOBACILLUS PO Take 1 capsule by mouth daily.     meloxicam (MOBIC) 15 MG tablet TAKE 1 TABLET DAILY 90 tablet 3   Multiple Vitamin (MULTI-VITAMIN) tablet Take by mouth.     Omega-3 Fatty Acids  (FISH OIL) 1000 MG CAPS Take by mouth.     omeprazole (PRILOSEC) 40 MG capsule Take 1 capsule (40 mg total) by mouth daily. 90 capsule 1   sertraline (ZOLOFT) 50 MG tablet Take 1 tablet (50 mg total) by mouth daily. 14 tablet 0   sildenafil (VIAGRA) 100 MG tablet Take 1 tablet (100 mg total) by mouth daily as needed for erectile dysfunction. 18 tablet 3   valsartan-hydrochlorothiazide (DIOVAN-HCT) 80-12.5 MG tablet TAKE 1 TABLET DAILY 90 tablet 3   tiZANidine (ZANAFLEX) 4 MG tablet Take 1 tablet (4 mg total) by mouth every 6 (six) hours as needed for muscle spasms. 90 tablet 0   Facility-Administered Medications Prior to Visit  Medication Dose Route Frequency Provider Last Rate Last Admin   testosterone cypionate (DEPOTESTOSTERONE CYPIONATE) injection 100 mg  100 mg Intramuscular Q14 Days Crecencio Mc, MD   100 mg at 09/12/20 0836   testosterone cypionate (DEPOTESTOSTERONE CYPIONATE) injection 100 mg  100 mg Intramuscular Q14 Days Crecencio Mc, MD   100 mg at 04/25/21 1049   testosterone cypionate (DEPOTESTOTERONE CYPIONATE) injection 100 mg  100 mg Intramuscular Q14 Days Crecencio Mc, MD   100 mg at 05/08/21 0848   testosterone cypionate (DEPOTESTOTERONE CYPIONATE) injection 100 mg  100 mg Intramuscular Q14 Days Crecencio Mc, MD   100 mg at 05/22/21 [id number]    Review of Systems;  Patient denies headache, fevers, malaise, unintentional weight loss, skin rash, eye pain, sinus congestion and  sinus pain, sore throat, dysphagia,  hemoptysis , cough, dyspnea, wheezing, chest pain, palpitations, orthopnea, edema, abdominal pain, nausea, melena, diarrhea, constipation, flank pain, dysuria, hematuria, urinary  Frequency, nocturia, numbness, tingling, seizures,  Focal weakness, Loss of consciousness,  Tremor, insomnia, depression, anxiety, and suicidal ideation.      Objective:  BP 126/74 (BP Location: Left Arm, Patient Position: Sitting, Cuff Size: Large)   Pulse 95   Temp (!) 96 F (35.6  C) (Temporal)   Resp 14   Ht '5\' 9"'$  (1.753 m)   Wt 234 lb 6.4 oz (106.3 kg)   SpO2 95%   BMI 34.61 kg/m   BP Readings from Last 3 Encounters:  07/12/21 126/74  10/22/20 130/70  09/21/20 116/68    Wt Readings from Last 3 Encounters:  07/12/21 234 lb 6.4 oz (106.3 kg)  10/22/20 243 lb 9.6 oz (110.5 kg)  09/21/20 241 lb 9.6 oz (109.6 kg)    General appearance: alert, cooperative and appears stated age Ears: normal TM's and external ear canals both ears Throat: lips, mucosa, and tongue normal; teeth and gums normal Neck: no adenopathy, no carotid bruit, supple, symmetrical, trachea midline and thyroid not enlarged, symmetric, no tenderness/mass/nodules Back: symmetric, no curvature. ROM normal. No CVA tenderness. Lungs: clear to auscultation bilaterally Heart: regular rate and rhythm, S1, S2 normal, no murmur, click, rub or gallop Abdomen: soft, non-tender; bowel sounds normal; no masses,  no organomegaly Pulses: 2+ and symmetric Skin: Skin color, texture, turgor normal. No rashes or lesions Lymph nodes: Cervical, supraclavicular, and axillary nodes normal.  Lab Results  Component Value Date   HGBA1C 6.0 03/05/2021   HGBA1C 6.1 (H) 09/02/2019   HGBA1C 6.0 07/22/2018    Lab Results  Component Value Date   CREATININE 1.20 03/05/2021   CREATININE 1.02 09/21/2020   CREATININE 1.20 01/26/2020    Lab Results  Component Value Date   WBC 7.0 03/05/2021   HGB 14.7 03/05/2021   HCT 44.5 03/05/2021   PLT 179.0 03/05/2021   GLUCOSE 97 03/05/2021   CHOL 174 09/21/2020   TRIG 341.0 (H) 09/21/2020   HDL 41.30 09/21/2020   LDLDIRECT 101.0 09/21/2020   LDLCALC 182 (H) 09/02/2019   ALT 29 09/21/2020   AST 17 09/21/2020   NA 140 03/05/2021   K 4.6 03/05/2021   CL 102 03/05/2021   CREATININE 1.20 03/05/2021   BUN 19 03/05/2021   CO2 30 03/05/2021   TSH 1.48 09/21/2020   PSA 5.62 (H) 02/02/2019   HGBA1C 6.0 03/05/2021    CT LUMBAR SPINE WO CONTRAST  Result Date:  09/13/2020 CLINICAL DATA:  Intervertebral disc degeneration lumbar region.Chronic low back pain. Spinal cord stimulator. EXAM: CT LUMBAR SPINE WITHOUT CONTRAST TECHNIQUE: Multidetector CT imaging of the lumbar spine was performed without intravenous contrast administration. Multiplanar CT image reconstructions were also generated. COMPARISON:  Lumbar MRI 04/19/2014 FINDINGS: Segmentation: L5 partially incorporated into the sacrum. Vertebral body numbering consistent with the prior study. Alignment: Slight retrolisthesis T12-L1, L1-2. 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 Paraspinal and other soft tissues: Negative for paraspinous mass or adenopathy. Abdominal aorta normal in caliber. Spinal cord stimulator enters the spinal canal at the T11-12 level. Disc levels: T11-12: Negative for stenosis T12-L1: Mild retrolisthesis. Mild disc bulging and mild facet degeneration without significant stenosis L1-2: Mild retrolisthesis. Moderate facet degeneration causing mild subarticular stenosis bilaterally. Spinal canal normal in size. L2-3: Disc degeneration with prominent endplate spurring diffusely and  moderate to advanced facet hypertrophy bilaterally. Moderate spinal stenosis and moderate subarticular stenosis bilaterally unchanged. L3-4: Posterior laminectomy. Solid interbody fusion which has developed since the prior study. Correlate with surgical history. Bilateral facet hypertrophy. No significant spinal or foraminal stenosis. L4-5: Disc degeneration with diffuse endplate spurring. Moderate to severe facet degeneration bilaterally. Mild subarticular stenosis bilaterally. Spinal canal adequate in size. L5-S1: Solid interbody bony fusion. This could be degenerative or postsurgical. 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 No significant spinal or foraminal  stenosis. IMPRESSION: Moderate spinal stenosis L2-3 with moderate subarticular stenosis bilaterally unchanged from prior MRI Laminectomy L3-4 with solid interbody fusion. No significant stenosis Mild subarticular stenosis bilaterally L4-5 L5 left transverse process articulates with the sacrum with degenerative change in bony overgrowth. Solid interbody fusion at L5-S1. 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 Electronically Signed   By: Franchot Gallo M.D.   On: 09/13/2020 09:22   MR LUMBAR SPINE WO CONTRAST  Result Date: 09/13/2020 CLINICAL DATA:  Spinal stenosis of lumbar region with neurogenic claudication. EXAM: MRI LUMBAR SPINE WITHOUT CONTRAST TECHNIQUE: Multiplanar, multisequence MR imaging of the lumbar spine was performed. No intravenous contrast was administered. COMPARISON:  09/13/2020 CT lumbar spine. 04/19/2014 MRI lumbar spine. 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 S1 butterfly vertebra with midline cleft. Alignment:  Minimal grade 1 L1-2 and L4-5 retrolisthesis. Vertebrae: Mild bone marrow heterogeneity. No focal osseous lesion. Degenerative related edema underlying the L1-2 endplates. Partial fusion of the L3-4 vertebral bodies. Conus medullaris and cauda equina: Conus extends to the T12 level. Conus and cauda equina appear normal. Disc levels: Multilevel desiccation and disc space loss. L1-2: Disc bulge with central protrusion, ligamentum flavum and bilateral facet hypertrophy. Mild spinal canal, moderate left and severe right neural foraminal narrowing, progressed since prior exam. L2-3: Disc bulge with ligamentum flavum and bilateral facet hypertrophy. Moderate spinal canal and severe bilateral neural foraminal narrowing, grossly unchanged. L3-4: Sequela of prior laminectomy. Partial fusion of the vertebral bodies. No significant disc bulge. Patent spinal canal. Moderate right and mild left neural foraminal narrowing. L4-5:  Sequela of prior laminectomy. Disc bulge with superimposed central protrusion. Bilateral facet hypertrophy. There is abutment of the right greater than left descending L5 nerve roots. Minimal spinal canal, mild left and moderate right neural foraminal narrowing, unchanged. L5-S1: Right predominant disc bulge and facet hypertrophy with mild right neural foraminal narrowing, unchanged. Patent spinal canal and left neural foramen. Patent spinal canal. Paraspinal and other soft tissues: Indwelling neurostimulator. IMPRESSION: Multilevel spondylosis, progressed since prior exam. Moderate L2-3 and mild L1-2 spinal canal narrowing. Moderate to severe bilateral L1-3, right L3-5 neural foraminal narrowing. 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 Electronically Signed   By: Primitivo Gauze M.D.   On: 09/13/2020 09:52    Assessment & Plan:   Problem List Items Addressed This Visit       Unprioritized   Hypogonadism in male   Hyperlipidemia - Primary   Relevant Orders   Comprehensive metabolic panel   Lipid panel   L-S radiculopathy    Sciatica has resolved post L4-5 ALIF, L2-3 TLIF      Prediabetes   Relevant Orders   Hemoglobin A1c   Microalbumin / creatinine urine ratio   Post-COVID chronic concentration deficit    With excessive fatigue.  Labs pending including b12 and thyroid to rule out concurrrent causes.       RESOLVED: Postviral  fatigue syndrome            Relevant Orders   CBC with Differential/Platelet   Thyroid Panel With TSH   Urinalysis, Routine w reflex microscopic   Other Visit Diagnoses     Vitamin D deficiency       Relevant Orders   VITAMIN D 25 Hydroxy (Vit-D Deficiency, Fractures)   B12 deficiency       Relevant Orders   Vitamin B12        I provided  30 minutes  during this encounter reviewing patient's current problems and recent back surgery.  labs and imaging studies, providing counseling on his current  symptoms, and coordination  of care .  Meds ordered this encounter  Medications   testosterone cypionate (DEPOTESTOTERONE CYPIONATE) injection 100 mg    Medications Discontinued During This Encounter  Medication Reason   tiZANidine (ZANAFLEX) 4 MG tablet     Follow-up: Return in about 3 months (around 10/12/2021) for follow up diabetes.   Crecencio Mc, MD

## 2021-07-14 DIAGNOSIS — U099 Post covid-19 condition, unspecified: Secondary | ICD-10-CM | POA: Insufficient documentation

## 2021-07-14 DIAGNOSIS — R41840 Attention and concentration deficit: Secondary | ICD-10-CM | POA: Insufficient documentation

## 2021-07-14 NOTE — Assessment & Plan Note (Signed)
Sciatica has resolved post L4-5 ALIF, L2-3 TLIF

## 2021-07-14 NOTE — Assessment & Plan Note (Signed)
With excessive fatigue.  Labs pending including b12 and thyroid to rule out concurrrent causes.

## 2021-07-17 ENCOUNTER — Ambulatory Visit: Payer: Medicare Other

## 2021-07-19 ENCOUNTER — Other Ambulatory Visit (INDEPENDENT_AMBULATORY_CARE_PROVIDER_SITE_OTHER): Payer: Medicare Other

## 2021-07-19 ENCOUNTER — Other Ambulatory Visit: Payer: Self-pay

## 2021-07-19 DIAGNOSIS — G933 Postviral fatigue syndrome: Secondary | ICD-10-CM | POA: Diagnosis not present

## 2021-07-19 DIAGNOSIS — E559 Vitamin D deficiency, unspecified: Secondary | ICD-10-CM | POA: Diagnosis not present

## 2021-07-19 DIAGNOSIS — G9331 Postviral fatigue syndrome: Secondary | ICD-10-CM

## 2021-07-19 DIAGNOSIS — R7303 Prediabetes: Secondary | ICD-10-CM | POA: Diagnosis not present

## 2021-07-19 DIAGNOSIS — E538 Deficiency of other specified B group vitamins: Secondary | ICD-10-CM | POA: Diagnosis not present

## 2021-07-19 DIAGNOSIS — E782 Mixed hyperlipidemia: Secondary | ICD-10-CM

## 2021-07-19 LAB — CBC WITH DIFFERENTIAL/PLATELET
Basophils Absolute: 0 10*3/uL (ref 0.0–0.1)
Basophils Relative: 0.5 % (ref 0.0–3.0)
Eosinophils Absolute: 0.1 10*3/uL (ref 0.0–0.7)
Eosinophils Relative: 2.1 % (ref 0.0–5.0)
HCT: 40 % (ref 39.0–52.0)
Hemoglobin: 13 g/dL (ref 13.0–17.0)
Lymphocytes Relative: 34.9 % (ref 12.0–46.0)
Lymphs Abs: 1.6 10*3/uL (ref 0.7–4.0)
MCHC: 32.4 g/dL (ref 30.0–36.0)
MCV: 85.2 fl (ref 78.0–100.0)
Monocytes Absolute: 0.4 10*3/uL (ref 0.1–1.0)
Monocytes Relative: 7.8 % (ref 3.0–12.0)
Neutro Abs: 2.5 10*3/uL (ref 1.4–7.7)
Neutrophils Relative %: 54.7 % (ref 43.0–77.0)
Platelets: 170 10*3/uL (ref 150.0–400.0)
RBC: 4.7 Mil/uL (ref 4.22–5.81)
RDW: 17 % — ABNORMAL HIGH (ref 11.5–15.5)
WBC: 4.5 10*3/uL (ref 4.0–10.5)

## 2021-07-19 LAB — COMPREHENSIVE METABOLIC PANEL
ALT: 20 U/L (ref 0–53)
AST: 20 U/L (ref 0–37)
Albumin: 4.1 g/dL (ref 3.5–5.2)
Alkaline Phosphatase: 75 U/L (ref 39–117)
BUN: 17 mg/dL (ref 6–23)
CO2: 26 mEq/L (ref 19–32)
Calcium: 8.9 mg/dL (ref 8.4–10.5)
Chloride: 103 mEq/L (ref 96–112)
Creatinine, Ser: 1.12 mg/dL (ref 0.40–1.50)
GFR: 67.66 mL/min (ref >60.00)
Glucose, Bld: 96 mg/dL (ref 70–99)
Potassium: 4.4 mEq/L (ref 3.5–5.1)
Sodium: 138 mEq/L (ref 135–145)
Total Bilirubin: 0.6 mg/dL (ref 0.2–1.2)
Total Protein: 6.5 g/dL (ref 6.0–8.3)

## 2021-07-19 LAB — URINALYSIS, ROUTINE W REFLEX MICROSCOPIC
Bilirubin Urine: NEGATIVE
Hgb urine dipstick: NEGATIVE
Ketones, ur: NEGATIVE
Leukocytes,Ua: NEGATIVE
Nitrite: NEGATIVE
RBC / HPF: NONE SEEN (ref 0–?)
Specific Gravity, Urine: 1.025 (ref 1.000–1.030)
Total Protein, Urine: NEGATIVE
Urine Glucose: NEGATIVE
Urobilinogen, UA: 0.2 (ref 0.0–1.0)
pH: 6 (ref 5.0–8.0)

## 2021-07-19 LAB — LIPID PANEL
Cholesterol: 159 mg/dL (ref 0–200)
HDL: 42.9 mg/dL (ref >39.00)
LDL Cholesterol: 88 mg/dL (ref 0–99)
NonHDL: 116.36
Total CHOL/HDL Ratio: 4
Triglycerides: 140 mg/dL (ref 0.0–149.0)
VLDL: 28 mg/dL (ref 0.0–40.0)

## 2021-07-19 LAB — HEMOGLOBIN A1C: Hgb A1c MFr Bld: 6.4 % (ref 4.6–6.5)

## 2021-07-19 LAB — MICROALBUMIN / CREATININE URINE RATIO
Creatinine,U: 192.3 mg/dL
Microalb Creat Ratio: 0.5 mg/g (ref 0.0–30.0)
Microalb, Ur: 0.9 mg/dL (ref 0.0–1.9)

## 2021-07-19 LAB — VITAMIN B12: Vitamin B-12: 315 pg/mL (ref 211–911)

## 2021-07-19 LAB — VITAMIN D 25 HYDROXY (VIT D DEFICIENCY, FRACTURES): VITD: 53.24 ng/mL (ref 30.00–100.00)

## 2021-07-20 LAB — THYROID PANEL WITH TSH
Free Thyroxine Index: 1.7 (ref 1.4–3.8)
T3 Uptake: 28 % (ref 22–35)
T4, Total: 5.9 ug/dL (ref 4.9–10.5)
TSH: 1.97 mIU/L (ref 0.40–4.50)

## 2021-07-27 ENCOUNTER — Other Ambulatory Visit: Payer: Self-pay | Admitting: Internal Medicine

## 2021-07-31 ENCOUNTER — Ambulatory Visit: Payer: Medicare Other

## 2021-08-06 DIAGNOSIS — M170 Bilateral primary osteoarthritis of knee: Secondary | ICD-10-CM | POA: Diagnosis not present

## 2021-08-14 ENCOUNTER — Ambulatory Visit (INDEPENDENT_AMBULATORY_CARE_PROVIDER_SITE_OTHER): Payer: Medicare Other

## 2021-08-14 ENCOUNTER — Other Ambulatory Visit: Payer: Self-pay

## 2021-08-14 DIAGNOSIS — E291 Testicular hypofunction: Secondary | ICD-10-CM | POA: Diagnosis not present

## 2021-08-14 DIAGNOSIS — M170 Bilateral primary osteoarthritis of knee: Secondary | ICD-10-CM | POA: Diagnosis not present

## 2021-08-14 DIAGNOSIS — R7989 Other specified abnormal findings of blood chemistry: Secondary | ICD-10-CM

## 2021-08-14 NOTE — Progress Notes (Signed)
Patient presented for a Testosterone Injection to Left ventrogluteal. Pt voiced no concerns or discomfort during time of injection.    Pt states that this is his last time receiving the Testosterone Injection from [place], as he is moving to [place]. Pt states that he has already bought a home but has not found a new PCP at this time. Pt states that he will go to his local urgent care to have his injections given to him.

## 2021-08-17 ENCOUNTER — Other Ambulatory Visit: Payer: Self-pay | Admitting: Internal Medicine

## 2021-08-17 DIAGNOSIS — B0229 Other postherpetic nervous system involvement: Secondary | ICD-10-CM

## 2021-09-18 ENCOUNTER — Ambulatory Visit (INDEPENDENT_AMBULATORY_CARE_PROVIDER_SITE_OTHER): Payer: Medicare Other

## 2021-09-18 VITALS — Ht 69.0 in | Wt 225.0 lb

## 2021-09-18 DIAGNOSIS — Z0000 Encounter for general adult medical examination without abnormal findings: Secondary | ICD-10-CM

## 2021-09-18 NOTE — Progress Notes (Addendum)
Subjective:   Bobby Rich is a 70 y.o. male who presents for Medicare Annual/Subsequent preventive examination.  Review of Systems    No ROS.  Medicare Wellness Virtual Visit.  Visual/audio telehealth visit, UTA vital signs.   See social history for additional risk factors.   Cardiac Risk Factors include: advanced age (>73men, >6 women);male gender;hypertension     Objective:    Today's Vitals   09/18/21 1122  Weight: 225 lb (102.1 kg)  Height: 5\' 9"  (1.753 m)   Body mass index is 33.23 kg/m.  Advanced Directives 09/18/2021 09/17/2020 09/15/2019 07/22/2018  Does Patient Have a Medical Advance Directive? Yes Yes No Yes  Type of Paramedic of [place];Living will [place];Living will - [place];Living will  Does patient want to make changes to medical advance directive? No - Patient declined No - Patient declined No - Patient declined Yes (Inpatient - patient requests chaplain consult to change a medical advance directive)  Copy of [place] in Chart? No - copy requested No - copy requested - No - copy requested    Current Medications (verified) Outpatient Encounter Medications as of 09/18/2021  Medication Sig   atorvastatin (LIPITOR) 20 MG tablet TAKE 1 TABLET DAILY   Coenzyme Q10 (CO Q-10) 100 MG CAPS Take 1 capsule by mouth daily.   gabapentin (NEURONTIN) 300 MG capsule TAKE 1 CAPSULE THREE TIMES A DAY   LACTOBACILLUS PO Take 1 capsule by mouth daily.   meloxicam (MOBIC) 15 MG tablet TAKE 1 TABLET DAILY   Multiple Vitamin (MULTI-VITAMIN) tablet Take by mouth.   Omega-3 Fatty Acids (FISH OIL) 1000 MG CAPS Take by mouth.   omeprazole (PRILOSEC) 40 MG capsule Take 1 capsule (40 mg total) by mouth daily.   sertraline (ZOLOFT) 50 MG tablet TAKE 1 TABLET DAILY   sildenafil (VIAGRA) 100 MG tablet Take 1 tablet (100 mg total) by mouth daily as needed for erectile dysfunction.    valsartan-hydrochlorothiazide (DIOVAN-HCT) 80-12.5 MG tablet TAKE 1 TABLET DAILY   Facility-Administered Encounter Medications as of 09/18/2021  Medication   testosterone cypionate (DEPOTESTOSTERONE CYPIONATE) injection 100 mg   testosterone cypionate (DEPOTESTOSTERONE CYPIONATE) injection 100 mg   testosterone cypionate (DEPOTESTOTERONE CYPIONATE) injection 100 mg   testosterone cypionate (DEPOTESTOTERONE CYPIONATE) injection 100 mg    Allergies (verified) Other   History: Past Medical History:  Diagnosis Date   Cancer ([place])    basel cell skin cancer   Depression    GERD (gastroesophageal reflux disease)    Hyperlipidemia    Hypertension    Low testosterone    Past Surgical History:  Procedure Laterality Date   COSMETIC SURGERY     HERNIA REPAIR Bilateral    with mesh    LUMBAR LAMINECTOMY  08/2016   SPINE SURGERY  2012   [place] L3 4 5    Family History  Problem Relation Age of Onset   Stroke Mother    Early death Father 29   Heart disease Father 49       AMI    Heart disease Brother 53       AMI   Social History   Socioeconomic History   Marital status: Single    Spouse name: Not on file   Number of children: Not on file   Years of education: Not on file   Highest education level: Not on file  Occupational History   Not on file  Tobacco Use   Smoking status:  Never   Smokeless tobacco: Never  Substance and Sexual Activity   Alcohol use: Yes    Comment: social   Drug use: No   Sexual activity: Yes  Other Topics Concern   Not on file  Social History Narrative   Not on file   Social Determinants of Health   Financial Resource Strain: Low Risk    Difficulty of Paying Living Expenses: Not hard at all  Food Insecurity: No Food Insecurity   Worried About Charity fundraiser in the Last Year: Never true   [place] in the Last Year: Never true  Transportation Needs: No Transportation Needs   Lack of Transportation (Medical): No   Lack of  Transportation (Non-Medical): No  Physical Activity: Sufficiently Active   Days of Exercise per Week: 7 days   Minutes of Exercise per Session: 30 min  Stress: Stress Concern Present   Feeling of Stress : To some extent  Social Connections: Unknown   Frequency of Communication with Friends and Family: Not on file   Frequency of Social Gatherings with Friends and Family: Not on file   Attends Religious Services: Not on Electrical engineer or Organizations: Not on file   Attends Archivist Meetings: Not on file   Marital Status: Married    Tobacco Counseling Counseling given: Not Answered   Clinical Intake:  Pre-visit preparation completed: Yes        Diabetes: No  How often do you need to have someone help you when you read instructions, pamphlets, or other written materials from your doctor or pharmacy?: 1 - Never    Interpreter Needed?: No      Activities of Daily Living In your present state of health, do you have any difficulty performing the following activities: 09/18/2021  Hearing? N  Vision? N  Difficulty concentrating or making decisions? N  Walking or climbing stairs? N  Dressing or bathing? N  Doing errands, shopping? N  Preparing Food and eating ? N  Using the Toilet? N  In the past six months, have you accidently leaked urine? N  Do you have problems with loss of bowel control? N  Managing your Medications? N  Managing your Finances? N  Housekeeping or managing your Housekeeping? N  Some recent data might be hidden    Patient Care Team: Crecencio Mc, MD as PCP - General (Internal Medicine)  Indicate any recent Medical Services you may have received from other than [facility] providers in the past year (date may be approximate).     Assessment:   This is a routine wellness examination for Bobby Rich.  I connected with Bobby Rich today by telephone and verified that I am speaking with the correct person using two  identifiers. Location patient: home Location provider: work Persons participating in the virtual visit: patient, Marine scientist.    I discussed the limitations, risks, security and privacy concerns of performing an evaluation and management service by telephone and the availability of in person appointments. The patient expressed understanding and verbally consented to this telephonic visit.    Interactive audio and video telecommunications were attempted between this provider and patient, however failed, due to patient having technical difficulties OR patient did not have access to video capability.  We continued and completed visit with audio only.  Some vital signs may be absent or patient reported.   Hearing/Vision screen Hearing Screening - Comments:: Hearing aids Vision Screening - Comments:: They have seen their ophthalmologist  in the last 12 months.   Dietary issues and exercise activities discussed:   Healthy diet Good water intake   Goals Addressed             This Visit's Progress    Follow up with Primary Care Provider       As needed Keep routine scheduled appointments       Depression Screen PHQ 2/9 Scores 09/18/2021 07/12/2021 07/12/2021 09/21/2020 09/17/2020 01/24/2020 09/15/2019  PHQ - 2 Score 0 2 2 2 3  0 0  PHQ- 9 Score - 5 6 6 5  - -    Fall Risk Fall Risk  09/18/2021 07/12/2021 10/22/2020 09/21/2020 09/17/2020  Falls in the past year? 0 0 0 0 0  Number falls in past yr: - - - - 0  Follow up Falls evaluation completed Falls evaluation completed Falls evaluation completed Falls evaluation completed Falls evaluation completed    FALL RISK PREVENTION PERTAINING TO THE HOME: Adequate lighting in your home to reduce risk of falls? Yes   ASSISTIVE DEVICES UTILIZED TO PREVENT FALLS: Life alert? No  Use of a cane, walker or w/c? No   TIMED UP AND GO: Was the test performed? No .   Cognitive Function:  Patient is alert and oriented x3.  MMSE/6CIT deferred. Normal  by direct communication/observation.    6CIT Screen 09/15/2019  What Year? 0 points  What month? 0 points  What time? 0 points  Count back from 20 0 points  Months in reverse 0 points  Repeat phrase 0 points  Total Score 0    Immunizations Immunization History  Administered Date(s) Administered   Fluad Quad(high Dose 65+) 08/02/2019, 09/21/2020   Influenza Split 02/10/2013   Influenza, High Dose Seasonal PF 08/18/2018   Influenza,inj,Quad PF,6+ Mos 10/07/2013, 11/01/2014, 11/20/2015, 07/22/2016   Influenza-Unspecified 10/07/2013, 11/01/2014, 11/20/2015, 07/22/2016   Moderna Sars-Covid-2 Vaccination 01/14/2020, 02/11/2020   Pneumococcal Conjugate-13 09/02/2019   Pneumococcal Polysaccharide-23 08/26/2018   Tdap 02/10/2013   Zoster, Live 06/11/2014   Shingrix Completed?: No.    Education has been provided regarding the importance of this vaccine. Patient has been advised to call insurance company to determine out of pocket expense if they have not yet received this vaccine. Advised may also receive vaccine at local pharmacy or Health Dept. Verbalized acceptance and understanding.  Health Maintenance Health Maintenance  Topic Date Due   COVID-19 Vaccine (3 - Moderna risk series) 10/04/2021 (Originally 03/10/2020)   Zoster Vaccines- Shingrix (1 of 2) 12/19/2021 (Originally 05/19/1970)   INFLUENZA VACCINE  02/28/2022 (Originally 07/01/2021)   COLONOSCOPY (Pts 45-81yrs Insurance coverage will need to be confirmed)  01/29/2022   TETANUS/TDAP  02/11/2023   Pneumonia Vaccine 49+ Years old  Completed   Hepatitis C Screening  Completed   HPV VACCINES  Aged Out   Lung Cancer Screening: (Low Dose CT Chest recommended if Age 38-80 years, 30 pack-year currently smoking OR have quit w/in 15years.) does not qualify.   Vision Screening: Recommended annual ophthalmology exams for early detection of glaucoma and other disorders of the eye.  Dental Screening: Recommended annual dental exams for  proper oral hygiene.  Community Resource Referral / Chronic Care Management: CRR required this visit?  No   CCM required this visit?  No      Plan:   Keep all routine maintenance appointments.   I have personally reviewed and noted the following in the patient's chart:   Medical and social history Use of alcohol, tobacco or illicit drugs  Current  medications and supplements including opioid prescriptions. Patient is not currently taking opioid prescriptions. Functional ability and status Nutritional status Physical activity Advanced directives List of other physicians Hospitalizations, surgeries, and ER visits in previous 12 months Vitals Screenings to include cognitive, depression, and falls Referrals and appointments  In addition, I have reviewed and discussed with patient certain preventive protocols, quality metrics, and best practice recommendations. A written personalized care plan for preventive services as well as general preventive health recommendations were provided to patient.     OBrien-Blaney, Keiden Deskin L, LPN   [id number]    I have reviewed the above information and agree with above.   Deborra Medina, MD

## 2021-09-18 NOTE — Patient Instructions (Addendum)
Bobby Rich , Thank you for taking time to come for your Medicare Wellness Visit. I appreciate your ongoing commitment to your health goals. Please review the following plan we discussed and let me know if I can assist you in the future.   These are the goals we discussed:  Goals      Follow up with Primary Care Provider     As needed Keep routine scheduled appointments        This is a list of the screening recommended for you and due dates:  Health Maintenance  Topic Date Due   COVID-19 Vaccine (3 - Moderna risk series) 10/04/2021*   Zoster (Shingles) Vaccine (1 of 2) 12/19/2021*   Flu Shot  02/28/2022*   Colon Cancer Screening  01/29/2022   Tetanus Vaccine  02/11/2023   Pneumonia Vaccine  Completed   Hepatitis C Screening: USPSTF Recommendation to screen - Ages 18-79 yo.  Completed   HPV Vaccine  Aged Out  *Topic was postponed. The date shown is not the original due date.    Advanced directives: End of life planning; Advance aging; Advanced directives discussed.  Copy of current HCPOA/Living Will requested.    Conditions/risks identified: none new  Follow up in one year for your annual wellness visit.   Preventive Care 64 Years and Older, Male Preventive care refers to lifestyle choices and visits with your health care provider that can promote health and wellness. What does preventive care include? A yearly physical exam. This is also called an annual well check. Dental exams once or twice a year. Routine eye exams. Ask your health care provider how often you should have your eyes checked. Personal lifestyle choices, including: Daily care of your teeth and gums. Regular physical activity. Eating a healthy diet. Avoiding tobacco and drug use. Limiting alcohol use. Practicing safe sex. Taking low doses of aspirin every day. Taking vitamin and mineral supplements as recommended by your health care provider. What happens during an annual well check? The services and  screenings done by your health care provider during your annual well check will depend on your age, overall health, lifestyle risk factors, and family history of disease. Counseling  Your health care provider may ask you questions about your: Alcohol use. Tobacco use. Drug use. Emotional well-being. Home and relationship well-being. Sexual activity. Eating habits. History of falls. Memory and ability to understand (cognition). Work and work Statistician. Screening  You may have the following tests or measurements: Height, weight, and BMI. Blood pressure. Lipid and cholesterol levels. These may be checked every 5 years, or more frequently if you are over 83 years old. Skin check. Lung cancer screening. You may have this screening every year starting at age 52 if you have a 30-pack-year history of smoking and currently smoke or have quit within the past 15 years. Fecal occult blood test (FOBT) of the stool. You may have this test every year starting at age 40. Flexible sigmoidoscopy or colonoscopy. You may have a sigmoidoscopy every 5 years or a colonoscopy every 10 years starting at age 74. Prostate cancer screening. Recommendations will vary depending on your family history and other risks. Hepatitis C blood test. Hepatitis B blood test. Sexually transmitted disease (STD) testing. Diabetes screening. This is done by checking your blood sugar (glucose) after you have not eaten for a while (fasting). You may have this done every 1-3 years. Abdominal aortic aneurysm (AAA) screening. You may need this if you are a current or former smoker. Osteoporosis.  You may be screened starting at age 87 if you are at high risk. Talk with your health care provider about your test results, treatment options, and if necessary, the need for more tests. Vaccines  Your health care provider may recommend certain vaccines, such as: Influenza vaccine. This is recommended every year. Tetanus, diphtheria, and  acellular pertussis (Tdap, Td) vaccine. You may need a Td booster every 10 years. Zoster vaccine. You may need this after age 18. Pneumococcal 13-valent conjugate (PCV13) vaccine. One dose is recommended after age 63. Pneumococcal polysaccharide (PPSV23) vaccine. One dose is recommended after age 30. Talk to your health care provider about which screenings and vaccines you need and how often you need them. This information is not intended to replace advice given to you by your health care provider. Make sure you discuss any questions you have with your health care provider. Document Released: 12/14/2015 Document Revised: 08/06/2016 Document Reviewed: 09/18/2015 Elsevier Interactive Patient Education  2017 [place] Prevention in the Home Falls can cause injuries. They can happen to people of all ages. There are many things you can do to make your home safe and to help prevent falls. What can I do on the outside of my home? Regularly fix the edges of walkways and driveways and fix any cracks. Remove anything that might make you trip as you walk through a door, such as a raised step or threshold. Trim any bushes or trees on the path to your home. Use bright outdoor lighting. Clear any walking paths of anything that might make someone trip, such as rocks or tools. Regularly check to see if handrails are loose or broken. Make sure that both sides of any steps have handrails. Any raised decks and porches should have guardrails on the edges. Have any leaves, snow, or ice cleared regularly. Use sand or salt on walking paths during winter. Clean up any spills in your garage right away. This includes oil or grease spills. What can I do in the bathroom? Use night lights. Install grab bars by the toilet and in the tub and shower. Do not use towel bars as grab bars. Use non-skid mats or decals in the tub or shower. If you need to sit down in the shower, use a plastic, non-slip stool. Keep  the floor dry. Clean up any water that spills on the floor as soon as it happens. Remove soap buildup in the tub or shower regularly. Attach bath mats securely with double-sided non-slip rug tape. Do not have throw rugs and other things on the floor that can make you trip. What can I do in the bedroom? Use night lights. Make sure that you have a light by your bed that is easy to reach. Do not use any sheets or blankets that are too big for your bed. They should not hang down onto the floor. Have a firm chair that has side arms. You can use this for support while you get dressed. Do not have throw rugs and other things on the floor that can make you trip. What can I do in the kitchen? Clean up any spills right away. Avoid walking on wet floors. Keep items that you use a lot in easy-to-reach places. If you need to reach something above you, use a strong step stool that has a grab bar. Keep electrical cords out of the way. Do not use floor polish or wax that makes floors slippery. If you must use wax, use non-skid floor wax.  Do not have throw rugs and other things on the floor that can make you trip. What can I do with my stairs? Do not leave any items on the stairs. Make sure that there are handrails on both sides of the stairs and use them. Fix handrails that are broken or loose. Make sure that handrails are as long as the stairways. Check any carpeting to make sure that it is firmly attached to the stairs. Fix any carpet that is loose or worn. Avoid having throw rugs at the top or bottom of the stairs. If you do have throw rugs, attach them to the floor with carpet tape. Make sure that you have a light switch at the top of the stairs and the bottom of the stairs. If you do not have them, ask someone to add them for you. What else can I do to help prevent falls? Wear shoes that: Do not have high heels. Have rubber bottoms. Are comfortable and fit you well. Are closed at the toe. Do not  wear sandals. If you use a stepladder: Make sure that it is fully opened. Do not climb a closed stepladder. Make sure that both sides of the stepladder are locked into place. Ask someone to hold it for you, if possible. Clearly mark and make sure that you can see: Any grab bars or handrails. First and last steps. Where the edge of each step is. Use tools that help you move around (mobility aids) if they are needed. These include: Canes. Walkers. Scooters. Crutches. Turn on the lights when you go into a dark area. Replace any light bulbs as soon as they burn out. Set up your furniture so you have a clear path. Avoid moving your furniture around. If any of your floors are uneven, fix them. If there are any pets around you, be aware of where they are. Review your medicines with your doctor. Some medicines can make you feel dizzy. This can increase your chance of falling. Ask your doctor what other things that you can do to help prevent falls. This information is not intended to replace advice given to you by your health care provider. Make sure you discuss any questions you have with your health care provider. Document Released: 09/13/2009 Document Revised: 04/24/2016 Document Reviewed: 12/22/2014 Elsevier Interactive Patient Education  2017 [employer].

## 2021-09-24 ENCOUNTER — Other Ambulatory Visit: Payer: Self-pay | Admitting: Internal Medicine

## 2021-10-09 MED ORDER — VALSARTAN 80 MG PO TABS: 80.0000 mg | ORAL_TABLET | Freq: Every day | ORAL | 3 refills | Status: DC

## 2021-10-14 ENCOUNTER — Ambulatory Visit: Payer: Medicare Other | Admitting: Internal Medicine

## 2022-01-13 ENCOUNTER — Other Ambulatory Visit: Payer: Self-pay

## 2022-01-13 MED ORDER — VALSARTAN 80 MG PO TABS: 80.0000 mg | ORAL_TABLET | Freq: Every day | ORAL | 3 refills | Status: DC

## 2022-02-08 ENCOUNTER — Encounter: Payer: Self-pay | Admitting: Internal Medicine

## 2022-02-10 MED ORDER — VALSARTAN 80 MG PO TABS: 80.0000 mg | ORAL_TABLET | Freq: Every day | ORAL | 1 refills | Status: DC

## 2022-04-02 ENCOUNTER — Telehealth (INDEPENDENT_AMBULATORY_CARE_PROVIDER_SITE_OTHER): Payer: Medicare Other | Admitting: Internal Medicine

## 2022-04-02 ENCOUNTER — Encounter: Payer: Self-pay | Admitting: Internal Medicine

## 2022-04-02 DIAGNOSIS — I25118 Atherosclerotic heart disease of native coronary artery with other forms of angina pectoris: Secondary | ICD-10-CM

## 2022-04-02 DIAGNOSIS — R7303 Prediabetes: Secondary | ICD-10-CM | POA: Diagnosis not present

## 2022-04-02 DIAGNOSIS — I2510 Atherosclerotic heart disease of native coronary artery without angina pectoris: Secondary | ICD-10-CM | POA: Insufficient documentation

## 2022-04-02 DIAGNOSIS — E782 Mixed hyperlipidemia: Secondary | ICD-10-CM

## 2022-04-02 NOTE — Progress Notes (Signed)
Virtual Visit via Caregility Note ? ?This visit type was conducted due to national recommendations for restrictions regarding the COVID-19 pandemic (e.g. social distancing).  This format is felt to be most appropriate for this patient at this time.  All issues noted in this document were discussed and addressed.  No physical exam was performed (except for noted visual exam findings with Video Visits).  ? ?I connected withNAME@ on 04/02/22 at  3:00 PM EDT by a video enabled telemedicine application  and verified that I am speaking with the correct person using two identifiers. ?Location patient: home ?Location provider: work or home office ?Persons participating in the virtual visit: patient, provider ? ?I discussed the limitations, risks, security and privacy concerns of performing an evaluation and management service by telephone and the availability of in person appointments. I also discussed with the patient that there may be a patient responsible charge related to this service. The patient expressed understanding and agreed to proceed. ? ? ?Reason for visit: coronary atherosclerosis  ? ?HPI: ? ?71 yr old male with hypertension, hyperlipidemia managed with atorvastatin , history of testosterone use presents for discussion of recent noninvasive cardiac testing done in [facility] to investigate c/o exertional dyspnea and chest pressure. ? ?Calcium score 435 on  coronary calcium CT scan done on 2/23 suggestive of extensive plaque burden.   Nuclear stress testing without perfusion defects, however patient continues to have worsening symptoms still concerning for anginal equivalent.  ? ?He is scheduled for a diagnostic/therapeutic LHC on March 10 in [place], [place]. ? 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    ? ? ?ROS: See pertinent positives and negatives per HPI. ? ?Past Medical History:  ?Diagnosis Date  ? Cancer [facility])    ? basel cell skin cancer  ? Depression   ? GERD (gastroesophageal reflux disease)   ? Hyperlipidemia   ? Hypertension   ? Low testosterone   ? ? ?Past Surgical History:  ?Procedure Laterality Date  ? COSMETIC SURGERY    ? HERNIA REPAIR Bilateral   ? with mesh   ? LUMBAR LAMINECTOMY  08/2016  ? [place] SURGERY  2012  ? [place] L3 4 5   ? ? ?Family History  ?Problem Relation Age of Onset  ? Stroke Mother   ? Early death Father 26  ? Heart disease Father 34  ?     AMI   ? Heart disease Brother 55  ?     AMI  ? ? ?SOCIAL HX:  reports that he has never smoked. He has never used smokeless tobacco. He reports current alcohol use. He reports that he does not use drugs.  ? ? ?Current Outpatient Medications:  ?  atorvastatin (LIPITOR) 20 MG tablet, TAKE 1 TABLET DAILY, Disp: 90 tablet, Rfl: 3 ?  Coenzyme Q10 (CO Q-10) 100 MG CAPS, Take 1 capsule by mouth daily., Disp: , Rfl:  ?  gabapentin (NEURONTIN) 300 MG capsule, TAKE 1 CAPSULE THREE TIMES A DAY, Disp: 270 capsule, Rfl: 3 ?  LACTOBACILLUS PO, Take 1 capsule by mouth daily., Disp: , Rfl:  ?  meloxicam (MOBIC) 15 MG tablet, TAKE 1 TABLET DAILY, Disp: 90 tablet, Rfl: 3 ?  Multiple Vitamin (MULTI-VITAMIN) tablet, Take by mouth., Disp: , Rfl:  ?  Omega-3 Fatty Acids (FISH OIL) 1000 MG CAPS, Take by mouth., Disp: , Rfl:  ?  omeprazole (PRILOSEC) 40 MG capsule,  Take 1 capsule (40 mg total) by mouth daily., Disp: 90 capsule, Rfl: 1 ?  sildenafil (VIAGRA) 100 MG tablet, Take 1 tablet (100 mg total) by mouth daily as needed for erectile dysfunction., Disp: 18 tablet, Rfl: 3 ?  valsartan (DIOVAN) 160 MG tablet, Take 160 mg by mouth daily., Disp: , Rfl:  ? ?Current Facility-Administered Medications:  ?  testosterone cypionate (DEPOTESTOSTERONE CYPIONATE) injection 100 mg, 100 mg, Intramuscular, Q14 Days, Crecencio Mc, MD, 100 mg at 09/12/20 0836 ?  testosterone cypionate (DEPOTESTOSTERONE CYPIONATE) injection 100 mg, 100 mg, Intramuscular, Q14 Days, Crecencio Mc, MD, 100 mg at  04/25/21 1049 ?  testosterone cypionate (DEPOTESTOTERONE CYPIONATE) injection 100 mg, 100 mg, Intramuscular, Q14 Days, Crecencio Mc, MD, 100 mg at 05/08/21 0848 ?  testosterone cypionate (DEPOTESTOTERONE CYPIONATE) injection 100 mg, 100 mg, Intramuscular, Q14 Days, Crecencio Mc, MD, 100 mg at 08/14/21 0941 ? ?EXAM: ? ?VITALS per patient if applicable: ? ?GENERAL: alert, oriented, appears well and in no acute distress ? ?HEENT: atraumatic, conjunttiva clear, no obvious abnormalities on inspection of external nose and ears ? ?NECK: normal movements of the head and neck ? ?LUNGS: on inspection no signs of respiratory distress, breathing rate appears normal, no obvious gross SOB, gasping or wheezing ? ?CV: no obvious cyanosis ? ?MS: moves all visible extremities without noticeable abnormality ? ?PSYCH/NEURO: pleasant and cooperative, no obvious depression or anxiety, speech and thought processing grossly intact ? ?ASSESSMENT AND PLAN: ? ?Discussed the following assessment and plan: ? ?Mixed hyperlipidemia ? ?Prediabetes ? 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 ?Hyperlipidemia ?Continue atorvastatin,  But goal will be LDL 70 given his high calcium score ? ?Lab Results  ?Component Value Date  ? CHOL 159 07/19/2021  ? HDL 42.90 07/19/2021  ? [place] 88 07/19/2021  ? LDLDIRECT 101.0 09/21/2020  ? TRIG 140.0 07/19/2021  ? CHOLHDL 4 07/19/2021  ? ? ? ?Prediabetes ?Last a1c suggested he is close to diagnosis of DM type 3.  .  Current fasting glucose is 109 . He is overdue for repeat labs  ? ?Lab Results  ?Component Value Date  ? HGBA1C 6.4 07/19/2021  ? ? ? ?Coronary atherosclerosis ?For [facility] on Wednesday May 10 ? ?  ?I discussed the assessment and treatment plan with the patient. The patient was provided an opportunity to ask questions and all were answered. The patient agreed with the plan and demonstrated an understanding of the  instructions. ?  ?The patient was advised to call back or seek an in-person evaluation if the symptoms worsen or if the condition fails to improve as anticipated. ? ? ?I spent 30 minutes dedicated to the care of this patient on the date of this encounter to include pre-visit review of his medical history,  including recent notes from his cardiology evaluation ,  Face-to-face time with the patient , and post visit ordering of testing and therapeutics.  ? ? ?Crecencio Mc, MD   ?

## 2022-04-02 NOTE — Assessment & Plan Note (Signed)
Last a1c suggested he is close to diagnosis of DM type 3.  .  Current fasting glucose is 109 . He is overdue for repeat labs  ? ?Lab Results  ?Component Value Date  ? HGBA1C 6.4 07/19/2021  ? ? ?

## 2022-04-02 NOTE — Assessment & Plan Note (Signed)
For [facility] on Wednesday May 10 ?

## 2022-04-02 NOTE — Assessment & Plan Note (Signed)
Continue atorvastatin,  But goal will be LDL 70 given his high calcium score ? ?Lab Results  ?Component Value Date  ? CHOL 159 07/19/2021  ? HDL 42.90 07/19/2021  ? [place] 88 07/19/2021  ? LDLDIRECT 101.0 09/21/2020  ? TRIG 140.0 07/19/2021  ? CHOLHDL 4 07/19/2021  ? ? ?

## 2022-05-13 ENCOUNTER — Other Ambulatory Visit: Payer: Self-pay | Admitting: Internal Medicine

## 2022-08-22 IMAGING — CT CT L SPINE W/O CM
3 series · 10 of 33 positions shown, 12 images · non-contrast
Comparison: Lumbar MRI 04/19/2014

CLINICAL DATA: Intervertebral disc degeneration lumbar
region.Chronic low back pain. Spinal cord stimulator.

EXAM:
CT LUMBAR SPINE WITHOUT CONTRAST
TECHNIQUE: Multidetector CT imaging of the lumbar spine was performed without
intravenous contrast administration. Multiplanar CT image
reconstructions were also generated.

[Series 4: l spine soft · axial · 0.36mm/px · z∈[-358,-220]mm · 2 of 150 slices shown, 3 images]
[im 46/150  soft-tissue]
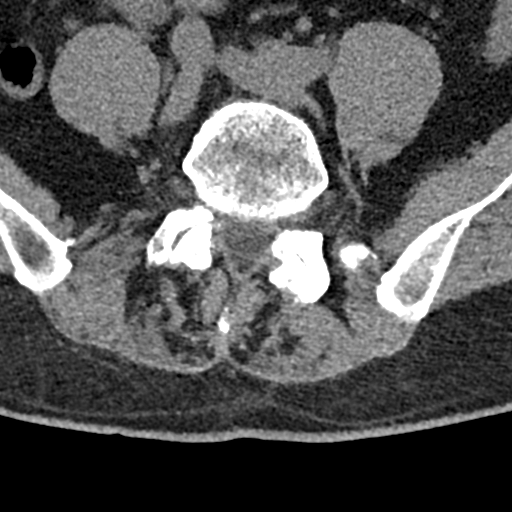
[im 46/150  bone]
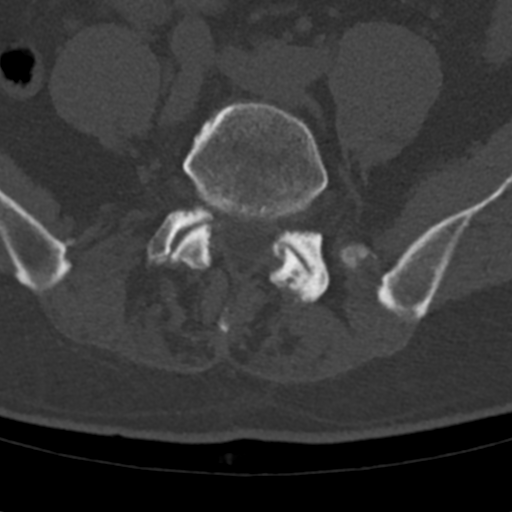
[im 115/150  bone]
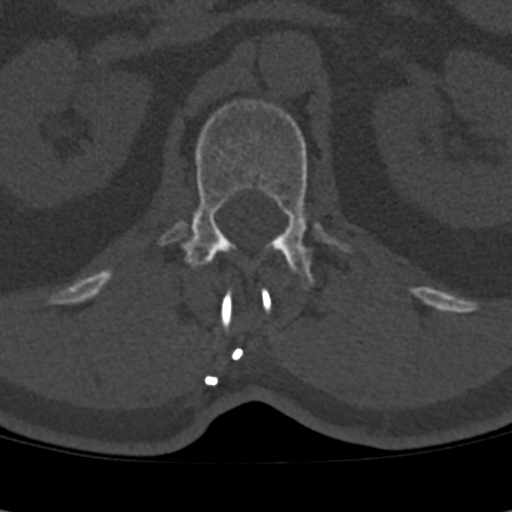

[Series 5: sagittal bone · sagittal · 0.35mm/px · 5 of 80 slices shown, 6 images]
[im 27/80  bone]
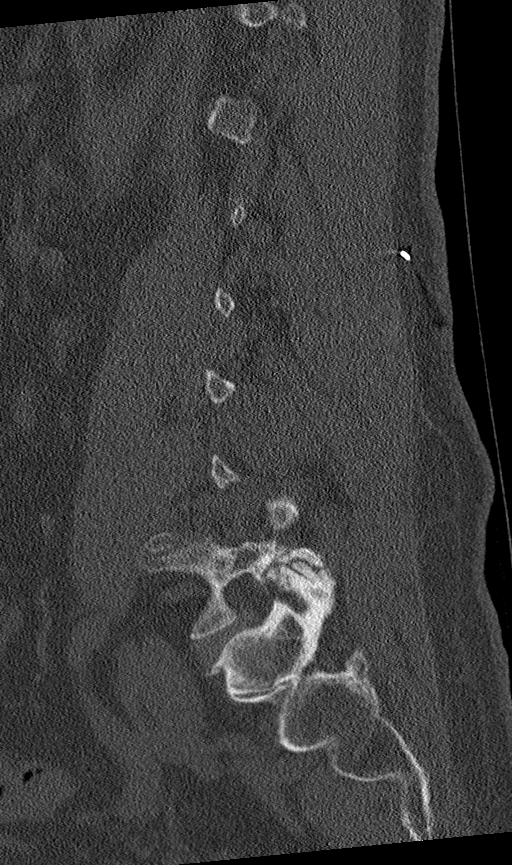
[im 33/80  bone]
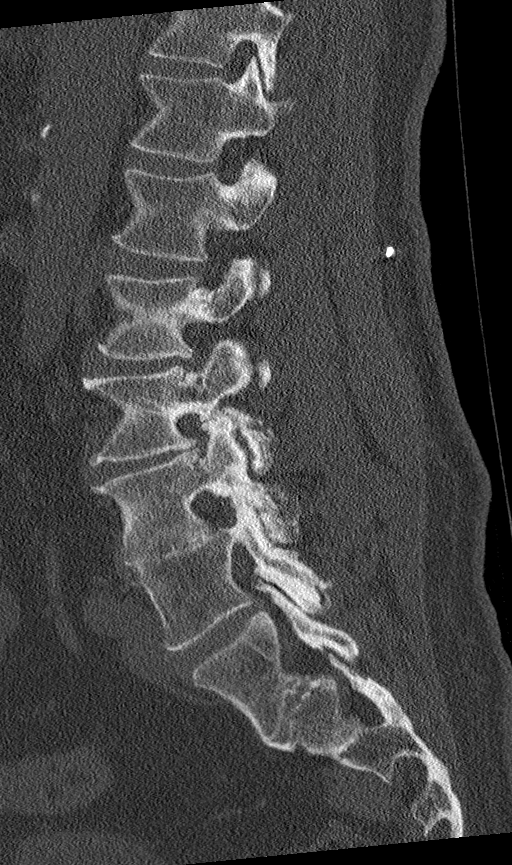
[im 40/80  soft-tissue]
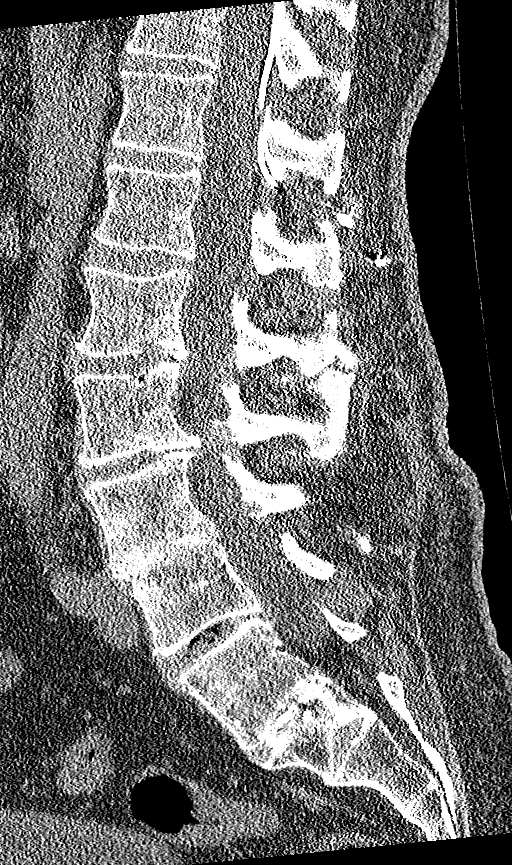
[im 40/80  bone]
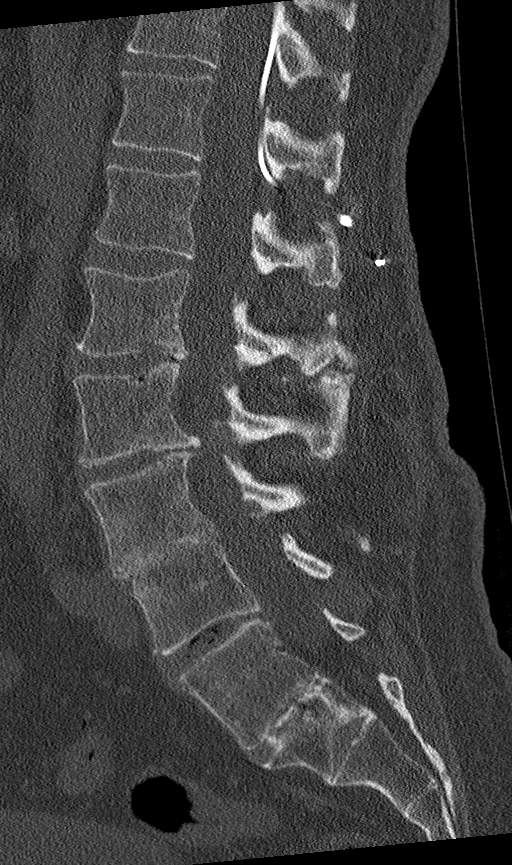
[im 47/80  bone]
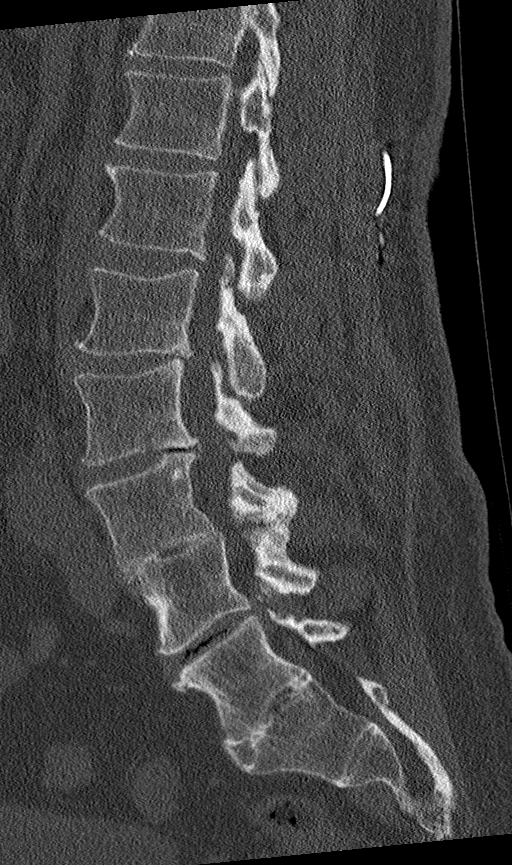
[im 53/80  bone]
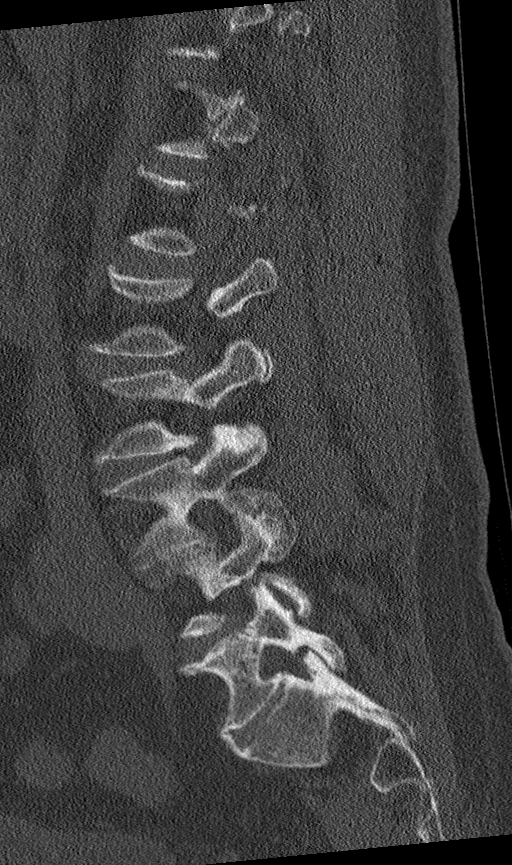

[Series 6: coronal bone · coronal · 0.31mm/px · 3 of 91 slices shown]
[im 19/91  bone]
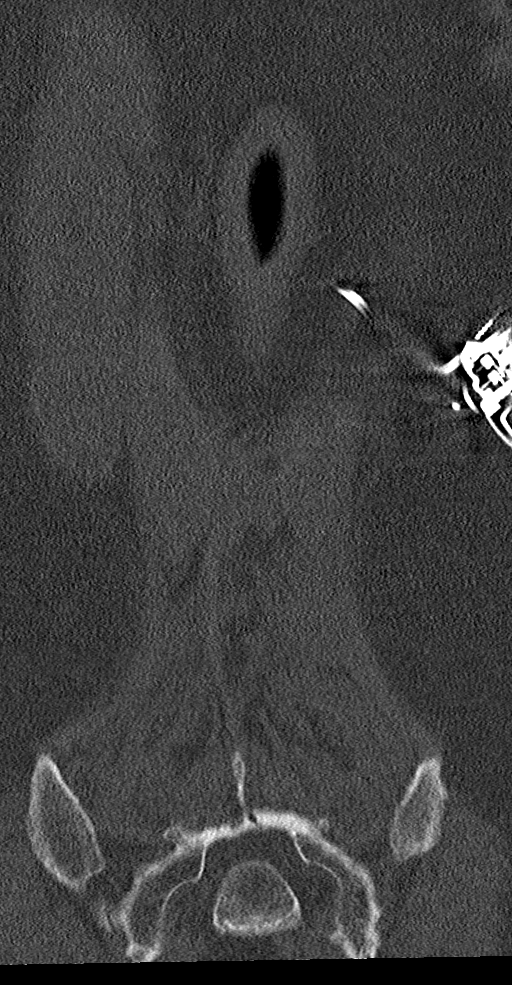
[im 37/91  bone]
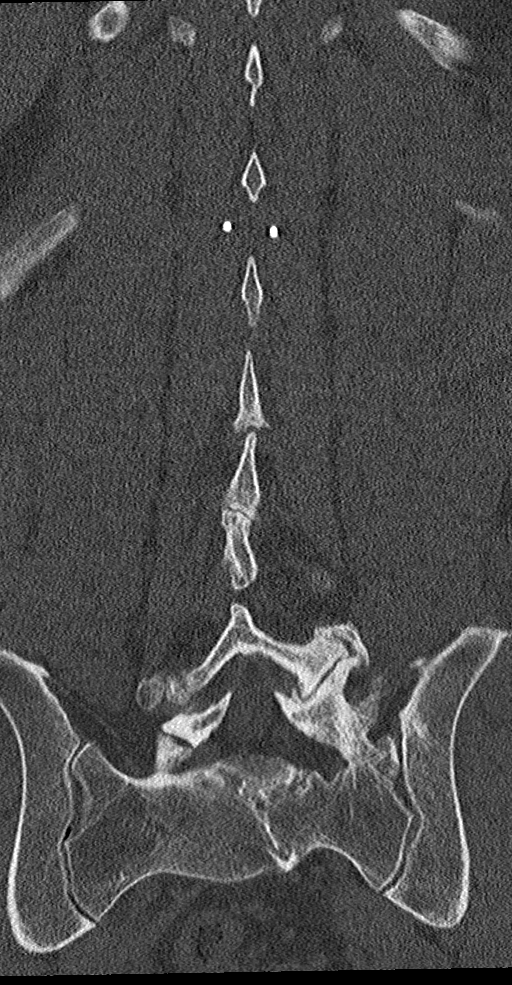
[im 55/91  bone]
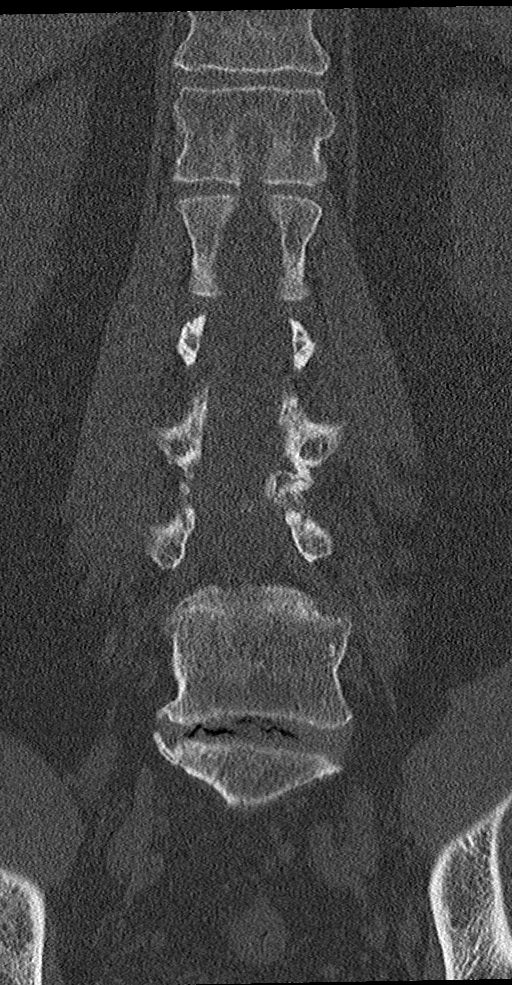

[10 of 33 positions shown; findings below may reference images not displayed]

FINDINGS: Segmentation: L5 partially incorporated into the sacrum. Vertebral
body numbering consistent with the prior study.

Alignment: Slight retrolisthesis T12-L1, L1-2.

Vertebrae: Negative for fracture or mass

Sagittal cleft in the S1 vertebral body is unchanged and appears
congenital.

Paraspinal and other soft tissues: Negative for paraspinous mass or
adenopathy. Abdominal aorta normal in caliber.

Spinal cord stimulator enters the spinal canal at the T11-12 level.

Disc levels: T11-12: Negative for stenosis

T12-L1: Mild retrolisthesis. Mild disc bulging and mild facet
degeneration without significant stenosis

L1-2: Mild retrolisthesis. Moderate facet degeneration causing mild
subarticular stenosis bilaterally. Spinal canal normal in size.

L2-3: Disc degeneration with prominent endplate spurring diffusely
and moderate to advanced facet hypertrophy bilaterally. Moderate
spinal stenosis and moderate subarticular stenosis bilaterally
unchanged.

L3-4: Posterior laminectomy. Solid interbody fusion which has
developed since the prior study. Correlate with surgical history.
Bilateral facet hypertrophy. No significant spinal or foraminal
stenosis.

L4-5: Disc degeneration with diffuse endplate spurring. Moderate to
severe facet degeneration bilaterally. Mild subarticular stenosis
bilaterally. Spinal canal adequate in size.

L5-S1: Solid interbody bony fusion. This could be degenerative or
postsurgical. Anomalous articulation of the left L5 transverse
process with the sacrum with bony overgrowth and degenerative
change. No significant spinal or foraminal stenosis.
IMPRESSION: Moderate spinal stenosis L2-3 with moderate subarticular stenosis
bilaterally unchanged from prior MRI

Laminectomy L3-4 with solid interbody fusion. No significant
stenosis

Mild subarticular stenosis bilaterally L4-5

L5 left transverse process articulates with the sacrum with
degenerative change in bony overgrowth. Solid interbody fusion at
L5-S1. Anomalous S1 vertebral body with sagittal cleft unchanged.

## 2023-01-05 ENCOUNTER — Other Ambulatory Visit: Payer: Self-pay | Admitting: Internal Medicine

## 2023-01-05 DIAGNOSIS — B0229 Other postherpetic nervous system involvement: Secondary | ICD-10-CM
